# Patient Record
Sex: Female | Born: 1985 | Race: Black or African American | Hispanic: No | Marital: Single | Smoking: Former smoker
Health system: Southern US, Community
[De-identification: ages and names within clinical notes are randomized; demographics above are authoritative.]

## PROBLEM LIST (undated history)

## (undated) ENCOUNTER — Inpatient Hospital Stay (HOSPITAL_COMMUNITY): Payer: Self-pay

## (undated) DIAGNOSIS — Z8632 Personal history of gestational diabetes: Secondary | ICD-10-CM

## (undated) DIAGNOSIS — A749 Chlamydial infection, unspecified: Secondary | ICD-10-CM

## (undated) DIAGNOSIS — D649 Anemia, unspecified: Secondary | ICD-10-CM

## (undated) DIAGNOSIS — R87619 Unspecified abnormal cytological findings in specimens from cervix uteri: Secondary | ICD-10-CM

## (undated) DIAGNOSIS — N939 Abnormal uterine and vaginal bleeding, unspecified: Secondary | ICD-10-CM

## (undated) DIAGNOSIS — D259 Leiomyoma of uterus, unspecified: Secondary | ICD-10-CM

## (undated) DIAGNOSIS — I340 Nonrheumatic mitral (valve) insufficiency: Secondary | ICD-10-CM

## (undated) DIAGNOSIS — A549 Gonococcal infection, unspecified: Secondary | ICD-10-CM

## (undated) DIAGNOSIS — R87629 Unspecified abnormal cytological findings in specimens from vagina: Secondary | ICD-10-CM

## (undated) DIAGNOSIS — IMO0002 Reserved for concepts with insufficient information to code with codable children: Secondary | ICD-10-CM

## (undated) DIAGNOSIS — D500 Iron deficiency anemia secondary to blood loss (chronic): Secondary | ICD-10-CM

## (undated) DIAGNOSIS — Z8619 Personal history of other infectious and parasitic diseases: Secondary | ICD-10-CM

## (undated) DIAGNOSIS — Z8759 Personal history of other complications of pregnancy, childbirth and the puerperium: Secondary | ICD-10-CM

## (undated) DIAGNOSIS — Z8742 Personal history of other diseases of the female genital tract: Secondary | ICD-10-CM

## (undated) DIAGNOSIS — I272 Pulmonary hypertension, unspecified: Secondary | ICD-10-CM

## (undated) HISTORY — DX: Unspecified abnormal cytological findings in specimens from vagina: R87.629

---

## 2009-07-29 ENCOUNTER — Inpatient Hospital Stay (HOSPITAL_COMMUNITY): Admission: AD | Admit: 2009-07-29 | Discharge: 2009-07-29 | Payer: Self-pay | Admitting: Obstetrics & Gynecology

## 2009-10-30 ENCOUNTER — Emergency Department (HOSPITAL_BASED_OUTPATIENT_CLINIC_OR_DEPARTMENT_OTHER): Admission: EM | Admit: 2009-10-30 | Discharge: 2009-10-30 | Payer: Self-pay | Admitting: Emergency Medicine

## 2010-03-24 LAB — URINALYSIS, ROUTINE W REFLEX MICROSCOPIC
Glucose, UA: NEGATIVE mg/dL
Nitrite: NEGATIVE
Protein, ur: NEGATIVE mg/dL
Urobilinogen, UA: 0.2 mg/dL (ref 0.0–1.0)

## 2010-03-24 LAB — POCT PREGNANCY, URINE: Preg Test, Ur: NEGATIVE

## 2010-03-24 LAB — WET PREP, GENITAL: Yeast Wet Prep HPF POC: NONE SEEN

## 2010-07-08 DIAGNOSIS — Z8619 Personal history of other infectious and parasitic diseases: Secondary | ICD-10-CM

## 2010-07-08 DIAGNOSIS — A749 Chlamydial infection, unspecified: Secondary | ICD-10-CM

## 2010-07-08 HISTORY — DX: Personal history of other infectious and parasitic diseases: Z86.19

## 2010-07-08 HISTORY — DX: Chlamydial infection, unspecified: A74.9

## 2010-12-25 ENCOUNTER — Inpatient Hospital Stay (HOSPITAL_COMMUNITY)
Admission: AD | Admit: 2010-12-25 | Discharge: 2010-12-25 | Disposition: A | Discharge status: Home / Self Care | Payer: Self-pay | Source: Ambulatory Visit | Attending: Obstetrics & Gynecology | Admitting: Obstetrics & Gynecology

## 2010-12-25 ENCOUNTER — Encounter (HOSPITAL_COMMUNITY): Payer: Self-pay

## 2010-12-25 ENCOUNTER — Inpatient Hospital Stay (HOSPITAL_COMMUNITY): Payer: Self-pay

## 2010-12-25 DIAGNOSIS — R109 Unspecified abdominal pain: Secondary | ICD-10-CM | POA: Insufficient documentation

## 2010-12-25 DIAGNOSIS — N760 Acute vaginitis: Secondary | ICD-10-CM | POA: Insufficient documentation

## 2010-12-25 DIAGNOSIS — N831 Corpus luteum cyst of ovary, unspecified side: Secondary | ICD-10-CM | POA: Insufficient documentation

## 2010-12-25 DIAGNOSIS — B9689 Other specified bacterial agents as the cause of diseases classified elsewhere: Secondary | ICD-10-CM | POA: Insufficient documentation

## 2010-12-25 DIAGNOSIS — A499 Bacterial infection, unspecified: Secondary | ICD-10-CM

## 2010-12-25 HISTORY — DX: Gonococcal infection, unspecified: A54.9

## 2010-12-25 HISTORY — DX: Reserved for concepts with insufficient information to code with codable children: IMO0002

## 2010-12-25 HISTORY — DX: Chlamydial infection, unspecified: A74.9

## 2010-12-25 HISTORY — DX: Unspecified abnormal cytological findings in specimens from cervix uteri: R87.619

## 2010-12-25 LAB — URINALYSIS, ROUTINE W REFLEX MICROSCOPIC
Glucose, UA: NEGATIVE mg/dL
Leukocytes, UA: NEGATIVE
Specific Gravity, Urine: 1.02 (ref 1.005–1.030)
pH: 6.5 (ref 5.0–8.0)

## 2010-12-25 LAB — POCT PREGNANCY, URINE: Preg Test, Ur: NEGATIVE

## 2010-12-25 LAB — WET PREP, GENITAL: Yeast Wet Prep HPF POC: NONE SEEN

## 2010-12-25 MED ORDER — METRONIDAZOLE 500 MG PO TABS: 500.0000 mg | ORAL_TABLET | Freq: Two times a day (BID) | ORAL | Status: AC

## 2010-12-25 MED ORDER — IBUPROFEN 200 MG PO TABS: 800.0000 mg | ORAL_TABLET | Freq: Four times a day (QID) | ORAL | Status: DC | PRN

## 2010-12-25 NOTE — Progress Notes (Signed)
Pt states she has had cramping x 3 months-everyday

## 2010-12-25 NOTE — Progress Notes (Signed)
Patient states lower abdominal cramping x3 months. OTC pain medicine doesn't help with pain. States her periods have been 3 days long for the past 4 months and they are normally 6 days long. Also reports RLQ pain when straining to have bowel movement. States was treated for gonorrhea & chlamydia in July 2012 & hasn't been with that partner since being treated, but did not get follow up testing done. Is currently with 2 sex partners and uses condoms.

## 2010-12-25 NOTE — ED Provider Notes (Signed)
History     Chief Complaint  Patient presents with  . Abdominal Pain   HPI 25 y.o. G0P0000 with cramping x 3 months, daily, constant, no better with tylenol or advil. Currently has two sex partners, uses condoms, has been treated for GC/CT earlier this year.    Past Medical History  Diagnosis Date  . Abnormal Pap smear     abn paps since 2000. no additional testing done.   . Gonorrhea     treated in July 2012. hasn't been with that partner since.   . Chlamydia 07/2010    treated. hasn't been with that partner since.     Past Surgical History  Procedure Date  . No past surgeries     Family History  Problem Relation Age of Onset  . Hypertension Mother   . Miscarriages / Stillbirths Sister   . Hypertension Sister   . Stroke Maternal Grandmother     History  Substance Use Topics  . Smoking status: Former Smoker    Quit date: 11/25/2010  . Smokeless tobacco: Not on file  . Alcohol Use: No    Allergies: Allergies no known allergies  Prescriptions prior to admission  Medication Sig Dispense Refill  . ibuprofen (ADVIL,MOTRIN) 200 MG tablet Take 400 mg by mouth every 6 (six) hours as needed. For pain         Review of Systems  Constitutional: Negative.   Respiratory: Negative.   Cardiovascular: Negative.   Gastrointestinal: Positive for abdominal pain. Negative for nausea, vomiting, diarrhea and constipation.       + pain with BM  Genitourinary: Negative for dysuria, urgency, frequency, hematuria and flank pain.       Negative for vaginal bleeding, dyspareunia, discharge   Musculoskeletal: Negative.   Neurological: Negative.   Psychiatric/Behavioral: Negative.    Physical Exam   Blood pressure 110/63, pulse 88, temperature 98.2 F (36.8 C), temperature source Oral, resp. rate 20, height 5\' 4"  (1.626 m), weight 200 lb (90.719 kg), last menstrual period 12/07/2010, SpO2 98.00%.  Physical Exam  Constitutional: She is oriented to person, place, and time. She  appears well-developed and well-nourished. No distress.  HENT:  Head: Normocephalic and atraumatic.  Cardiovascular: Normal rate, regular rhythm and normal heart sounds.   Respiratory: Effort normal and breath sounds normal. No respiratory distress.  GI: Soft. Bowel sounds are normal. She exhibits no distension and no mass. There is no tenderness. There is no rebound and no guarding.  Genitourinary: There is no rash or lesion on the right labia. There is no rash or lesion on the left labia. Uterus is not deviated, not enlarged, not fixed and not tender. Cervix exhibits no motion tenderness, no discharge and no friability. Right adnexum displays tenderness. Right adnexum displays no mass and no fullness. Left adnexum displays no mass, no tenderness and no fullness. No erythema, tenderness or bleeding around the vagina. Vaginal discharge (white) found.  Neurological: She is alert and oriented to person, place, and time.  Skin: Skin is warm and dry.  Psychiatric: She has a normal mood and affect.    MAU Course  Procedures  Results for orders placed during the hospital encounter of 12/25/10 (from the past 24 hour(s))  URINALYSIS, ROUTINE W REFLEX MICROSCOPIC     Status: Normal   Collection Time   12/25/10  9:15 PM      Component Value Range   Color, Urine YELLOW  YELLOW    APPearance CLEAR  CLEAR    Specific Gravity,  Urine 1.020  1.005 - 1.030    pH 6.5  5.0 - 8.0    Glucose, UA NEGATIVE  NEGATIVE (mg/dL)   Hgb urine dipstick NEGATIVE  NEGATIVE    Bilirubin Urine NEGATIVE  NEGATIVE    Ketones, ur NEGATIVE  NEGATIVE (mg/dL)   Protein, ur NEGATIVE  NEGATIVE (mg/dL)   Urobilinogen, UA 0.2  0.0 - 1.0 (mg/dL)   Nitrite NEGATIVE  NEGATIVE    Leukocytes, UA NEGATIVE  NEGATIVE   POCT PREGNANCY, URINE     Status: Normal   Collection Time   12/25/10  9:24 PM      Component Value Range   Preg Test, Ur NEGATIVE    WET PREP, GENITAL     Status: Abnormal   Collection Time   12/25/10  9:46 PM       Component Value Range   Yeast, Wet Prep NONE SEEN  NONE SEEN    Trich, Wet Prep NONE SEEN  NONE SEEN    Clue Cells, Wet Prep FEW (*) NONE SEEN    WBC, Wet Prep HPF POC NONE SEEN  NONE SEEN     US Transvaginal Non-ob  12/25/2010  *RADIOLOGY REPORT*  Clinical Data: Right pelvic pain x 3 months  TRANSABDOMINAL AND TRANSVAGINAL ULTRASOUND OF PELVIS Technique:  Both transabdominal and transvaginal ultrasound examinations of the pelvis were performed. Transabdominal technique was performed for global imaging of the pelvis including uterus, ovaries, adnexal regions, and pelvic cul-de-sac.  Comparison: None.   It was necessary to proceed with endovaginal exam following the transabdominal exam to visualize the bilateral ovaries.  Findings:  Uterus: Normal in size and appearance, measuring 8.0 x 4.1 x 4.5 cm.  Endometrium: Normal in thickness and appearance, measuring 11 mm.  Right ovary:  Normal appearance/no adnexal mass, measuring 3.0 x 1.8 x 2.0 cm, notable for a 1.1 cm involuting corpus luteal cyst.  Left ovary: Normal appearance/no adnexal mass, measuring 3.0 x 1.9 x 2.1 cm.  Other findings: No free fluid.  IMPRESSION: Normal study.  No evidence of pelvic mass or other significant abnormality.  1.1 cm involuting right corpus luteal cyst.  Original Report Authenticated By: Charline Bills, M.D.   US Pelvis Complete  12/25/2010  *RADIOLOGY REPORT*  Clinical Data: Right pelvic pain x 3 months  TRANSABDOMINAL AND TRANSVAGINAL ULTRASOUND OF PELVIS Technique:  Both transabdominal and transvaginal ultrasound examinations of the pelvis were performed. Transabdominal technique was performed for global imaging of the pelvis including uterus, ovaries, adnexal regions, and pelvic cul-de-sac.  Comparison: None.   It was necessary to proceed with endovaginal exam following the transabdominal exam to visualize the bilateral ovaries.  Findings:  Uterus: Normal in size and appearance, measuring 8.0 x 4.1 x 4.5 cm.   Endometrium: Normal in thickness and appearance, measuring 11 mm.  Right ovary:  Normal appearance/no adnexal mass, measuring 3.0 x 1.8 x 2.0 cm, notable for a 1.1 cm involuting corpus luteal cyst.  Left ovary: Normal appearance/no adnexal mass, measuring 3.0 x 1.9 x 2.1 cm.  Other findings: No free fluid.  IMPRESSION: Normal study.  No evidence of pelvic mass or other significant abnormality.  1.1 cm involuting right corpus luteal cyst.  Original Report Authenticated By: Charline Bills, M.D.    Assessment and Plan  25 y.o. female with BV and corpus luteum cyst Rx Flagyl 500 BID x 7 days F/U at [facility] for further STD testing if desired  FRAZIER,NATALIE 12/25/2010, 9:39 PM

## 2010-12-26 LAB — GC/CHLAMYDIA PROBE AMP, GENITAL
Chlamydia, DNA Probe: NEGATIVE
GC Probe Amp, Genital: NEGATIVE

## 2011-08-26 ENCOUNTER — Emergency Department (HOSPITAL_COMMUNITY): Payer: Self-pay

## 2011-08-26 ENCOUNTER — Encounter (HOSPITAL_COMMUNITY): Payer: Self-pay | Admitting: *Deleted

## 2011-08-26 ENCOUNTER — Emergency Department (HOSPITAL_COMMUNITY)
Admission: EM | Admit: 2011-08-26 | Discharge: 2011-08-26 | Disposition: A | Discharge status: Home / Self Care | Payer: Self-pay | Attending: Emergency Medicine | Admitting: Emergency Medicine

## 2011-08-26 DIAGNOSIS — Z87891 Personal history of nicotine dependence: Secondary | ICD-10-CM | POA: Insufficient documentation

## 2011-08-26 DIAGNOSIS — Z823 Family history of stroke: Secondary | ICD-10-CM | POA: Insufficient documentation

## 2011-08-26 DIAGNOSIS — Z8249 Family history of ischemic heart disease and other diseases of the circulatory system: Secondary | ICD-10-CM | POA: Insufficient documentation

## 2011-08-26 DIAGNOSIS — IMO0002 Reserved for concepts with insufficient information to code with codable children: Secondary | ICD-10-CM | POA: Insufficient documentation

## 2011-08-26 DIAGNOSIS — S9030XA Contusion of unspecified foot, initial encounter: Secondary | ICD-10-CM

## 2011-08-26 DIAGNOSIS — Z8489 Family history of other specified conditions: Secondary | ICD-10-CM | POA: Insufficient documentation

## 2011-08-26 MED ORDER — IBUPROFEN 800 MG PO TABS: 800.0000 mg | ORAL_TABLET | Freq: Three times a day (TID) | ORAL | Status: AC

## 2011-08-26 NOTE — ED Provider Notes (Signed)
History  This chart was scribed for Darlene Octave, MD by Darlene Hayes. The patient was seen in room TR04C/TR04C. Patient's care was started at 1323.     CSN: [id number]  Arrival date & time 08/26/11  1323   None     Chief Complaint  Patient presents with  . Foot Pain    The history is provided by the patient. No language interpreter was used.   Darlene Hayes is a 26 y.o. female who presents to the Emergency Department complaining of moderate, constant right foot pain onset. Patient states that she stubbed her toe on her father's prosthetic leg 1 week ago. She says that at the time of the injury there was some immediate soreness and bleeding, but pain has worsened in the past 1-2 days. Patient hasn't elevated her foot or taken any medications for pain relief. Patient has history gonorrhea, chlamydia and abnormal past smears. Patient is a former smoker (quit date: 11/25/2010).    Past Medical History  Diagnosis Date  . Abnormal Pap smear     abn paps since 2000. no additional testing done.   . Gonorrhea     treated in July 2012. hasn't been with that partner since.   . Chlamydia 07/2010    treated. hasn't been with that partner since.     Past Surgical History  Procedure Date  . No past surgeries     Family History  Problem Relation Age of Onset  . Hypertension Mother   . Miscarriages / Stillbirths Sister   . Hypertension Sister   . Stroke Maternal Grandmother     History  Substance Use Topics  . Smoking status: Former Smoker    Quit date: 11/25/2010  . Smokeless tobacco: Not on file  . Alcohol Use: No    OB History    Grav Para Term Preterm Abortions TAB SAB Ect Mult Living   0 0 0 0 0 0 0 0 0 0       Review of Systems A complete 10 system review of systems was obtained and all systems are negative except as noted in the HPI and PMH. .  Allergies  Review of patient's allergies indicates no known allergies.  Home Medications  No current outpatient  prescriptions on file.  BP 116/75  Pulse 100  Temp 98.2 F (36.8 C) (Oral)  Resp 16  SpO2 100%  LMP 08/19/2011  Physical Exam  Constitutional: She is oriented to person, place, and time. She appears well-developed and well-nourished.  HENT:  Head: Normocephalic and atraumatic.  Cardiovascular:  Pulses:      Dorsalis pedis pulses are 2+ on the right side, and 2+ on the left side.       Posterior tibial pulses are 2+ on the right side, and 2+ on the left side.  Musculoskeletal:       Diffuse swelling of the left second digit. No pain at base of fifth metatarsal. No pain over the malleoli. No open wounds.  Neurological: She is alert and oriented to person, place, and time.  Skin: Skin is warm and dry.  Psychiatric: She has a normal mood and affect. Her behavior is normal.    ED Course  Procedures (including critical care time) DIAGNOSTIC STUDIES: Oxygen Saturation is 100% on room air, normal by my interpretation.    COORDINATION OF CARE: 2:50pm- Patient informed of current plan for treatment and evaluation and agrees with plan at this time.   Dg Foot Complete Left  08/26/2011  *RADIOLOGY  REPORT*  Clinical Data: Pain and swelling, injury 1 week ago  LEFT FOOT - COMPLETE 3+ VIEW  Comparison: None  Findings: Osseous mineralization normal. Joint spaces preserved. No acute fracture, dislocation, or bone destruction.  IMPRESSION: No acute osseous abnormalities.   Original Report Authenticated By: Lollie Marrow, M.D.      No diagnosis found.    MDM  Stubbed 1st and 2nd toes 1 week ago.  Now with swelling and pain to second digit and forefoot.  No weakness, numbness, tingling.  No open wounds. +2 DP and PT pulses. No pain at base of 5th MT.   I personally performed the services described in this documentation, which was scribed in my presence.  The recorded information has been reviewed and considered.   Darlene Octave, MD 08/27/11 [phone]

## 2011-08-26 NOTE — ED Notes (Signed)
PT states walked into family member's prothesis and injured left foot

## 2012-04-29 ENCOUNTER — Ambulatory Visit: Payer: Self-pay | Admitting: Women's Health

## 2012-05-14 ENCOUNTER — Ambulatory Visit: Payer: Self-pay | Admitting: Women's Health

## 2012-10-13 ENCOUNTER — Encounter: Payer: Self-pay | Admitting: Women's Health

## 2012-10-13 ENCOUNTER — Other Ambulatory Visit (HOSPITAL_COMMUNITY)
Admission: RE | Admit: 2012-10-13 | Discharge: 2012-10-13 | Disposition: A | Discharge status: Home / Self Care | Payer: Self-pay | Source: Ambulatory Visit | Attending: Gynecology | Admitting: Gynecology

## 2012-10-13 ENCOUNTER — Ambulatory Visit (INDEPENDENT_AMBULATORY_CARE_PROVIDER_SITE_OTHER): Payer: PRIVATE HEALTH INSURANCE | Admitting: Women's Health

## 2012-10-13 VITALS — BP 120/70 | Ht 64.75 in | Wt 189.6 lb

## 2012-10-13 DIAGNOSIS — F17200 Nicotine dependence, unspecified, uncomplicated: Secondary | ICD-10-CM | POA: Insufficient documentation

## 2012-10-13 DIAGNOSIS — E669 Obesity, unspecified: Secondary | ICD-10-CM | POA: Insufficient documentation

## 2012-10-13 DIAGNOSIS — Z01419 Encounter for gynecological examination (general) (routine) without abnormal findings: Secondary | ICD-10-CM | POA: Insufficient documentation

## 2012-10-13 DIAGNOSIS — Z23 Encounter for immunization: Secondary | ICD-10-CM

## 2012-10-13 DIAGNOSIS — Z113 Encounter for screening for infections with a predominantly sexual mode of transmission: Secondary | ICD-10-CM

## 2012-10-13 DIAGNOSIS — Z833 Family history of diabetes mellitus: Secondary | ICD-10-CM

## 2012-10-13 LAB — CBC WITH DIFFERENTIAL/PLATELET
Basophils Absolute: 0 10*3/uL (ref 0.0–0.1)
Eosinophils Absolute: 0.1 10*3/uL (ref 0.0–0.7)
Lymphocytes Relative: 40 % (ref 12–46)
Lymphs Abs: 1.6 10*3/uL (ref 0.7–4.0)
Neutrophils Relative %: 44 % (ref 43–77)
Platelets: 206 10*3/uL (ref 150–400)
RBC: 3.9 MIL/uL (ref 3.87–5.11)
RDW: 18 % — ABNORMAL HIGH (ref 11.5–15.5)
WBC: 4 10*3/uL (ref 4.0–10.5)

## 2012-10-13 LAB — RPR

## 2012-10-13 NOTE — Patient Instructions (Addendum)

## 2012-10-13 NOTE — Progress Notes (Signed)
Darlene Hayes 1985/02/09 [id number]    History:     New patient presents for annual exam.  Regular monthly cycle/condoms/one partner for one year.  Treated for Chlamydia/ gonorrhea 2011 with negative test of cure. History of abnormal Pap but no treatment needed with Pap normal. Has not had gardasil. Smoker.  Past medical history, past surgical history, family history and social history were all reviewed and documented in the EPIC chart. 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  Mother HTN Sister HTN MGM Diabetes, stroke  ROS:  A  ROS was performed and pertinent positives and negatives are included in the history.  Exam:  Filed Vitals:   10/13/12 0905  BP: 120/70    General appearance:  Normal Head/Neck:  Normal, without cervical or supraclavicular adenopathy. Thyroid:  Symmetrical, normal in size, without palpable masses or nodularity. Respiratory  Effort:  Normal  Auscultation:  Clear without wheezing or rhonchi Cardiovascular  Auscultation:  Regular rate, without rubs, murmurs or gallops  Edema/varicosities:  Not grossly evident Abdominal  Soft,nontender, without masses, guarding or rebound.  Liver/spleen:  No organomegaly noted  Hernia:  None appreciated  Skin  Inspection:  Grossly normal  Palpation:  Grossly normal Neurologic/psychiatric  Orientation:  Normal with appropriate conversation.  Mood/affect:  Normal  Genitourinary    Breasts: Examined lying and sitting.     Right: Without masses, retractions, discharge or axillary adenopathy.     Left: Without masses, retractions, discharge or axillary adenopathy.   Inguinal/mons:  Normal without inguinal adenopathy  External genitalia:  Normal  BUS/Urethra/Skene's glands:  Normal  Bladder:  Normal  Vagina:  Normal  Cervix:  Normal without CMT or tenderness  Uterus:  Normal in size, shape and contour.  Midline and mobile  Adnexa/parametria:     Rt: Without masses or  tenderness.   Lt: Without masses or tenderness.  Anus and perineum: Normal  Digital rectal exam: Normal sphincter tone without palpated masses or tenderness  Assessment/Plan:  27 y.o. SBF for annual exam.     Normal GYN exam Contraception management STD Screen  Obesity Smoker  Plan: Tdap administered.  Reviewed heart healthy low-sodium diet with exercise/ weight loss.  Contraception options reviewed and declined will continue with condom use, Plan B emergency contraception reviewed. SBE's, exercise, calcium rich diet, MVI daily, decrease and/or quit smoking  encouraged.  UA, CBC, glucose, GC/ chalmydia, RPR, HIV, Hep Darlene Hayes, 9:36 AM 10/13/2012

## 2012-10-14 LAB — URINALYSIS W MICROSCOPIC + REFLEX CULTURE
Casts: NONE SEEN
Hgb urine dipstick: NEGATIVE
Ketones, ur: NEGATIVE mg/dL
Nitrite: POSITIVE — AB
Specific Gravity, Urine: 1.018 (ref 1.005–1.030)
Urobilinogen, UA: 0.2 mg/dL (ref 0.0–1.0)
pH: 6 (ref 5.0–8.0)

## 2012-10-14 LAB — HEPATITIS B SURFACE ANTIGEN: Hepatitis B Surface Ag: NEGATIVE

## 2012-10-14 LAB — HIV ANTIBODY (ROUTINE TESTING W REFLEX): HIV: NONREACTIVE

## 2012-10-15 ENCOUNTER — Other Ambulatory Visit: Payer: Self-pay | Admitting: Women's Health

## 2012-10-15 LAB — URINE CULTURE: Colony Count: >=100000

## 2012-10-15 MED ORDER — SULFAMETHOXAZOLE-TMP DS 800-160 MG PO TABS: 1.0000 | ORAL_TABLET | Freq: Two times a day (BID) | ORAL | Status: DC

## 2013-02-02 ENCOUNTER — Ambulatory Visit: Payer: PRIVATE HEALTH INSURANCE | Admitting: Women's Health

## 2013-02-10 ENCOUNTER — Ambulatory Visit: Payer: PRIVATE HEALTH INSURANCE | Admitting: Women's Health

## 2013-07-14 ENCOUNTER — Encounter: Payer: PRIVATE HEALTH INSURANCE | Admitting: Medical

## 2013-08-25 ENCOUNTER — Ambulatory Visit: Payer: PRIVATE HEALTH INSURANCE | Admitting: Women's Health

## 2013-10-20 ENCOUNTER — Encounter: Payer: PRIVATE HEALTH INSURANCE | Admitting: Women's Health

## 2015-01-08 NOTE — L&D Delivery Note (Signed)
Delivery Note At 5:44 PM a viable female was delivered via Vaginal, Spontaneous Delivery (Presentation:vertex ; ROA ).  APGAR: 8, 9; weight  .   Placenta status:spontaneous , shultz.  Cord: 3VC with the following complications:none .  Cord pH: n/a  Anesthesia:  epidural Episiotomy: None Lacerations:  n/a Suture Repair: n/a Est. Blood Loss (mL):  100  Mom to postpartum.  Baby to Couplet care / Skin to Skin.  Darlene Hayes 09/03/2015, 6:07 PM

## 2015-01-18 ENCOUNTER — Inpatient Hospital Stay (HOSPITAL_COMMUNITY)
Admission: AD | Admit: 2015-01-18 | Discharge: 2015-01-18 | Disposition: A | Discharge status: Home / Self Care | Payer: BLUE CROSS/BLUE SHIELD | Source: Ambulatory Visit | Attending: Family Medicine | Admitting: Family Medicine

## 2015-01-18 ENCOUNTER — Encounter (HOSPITAL_COMMUNITY): Payer: Self-pay | Admitting: *Deleted

## 2015-01-18 DIAGNOSIS — B354 Tinea corporis: Secondary | ICD-10-CM | POA: Insufficient documentation

## 2015-01-18 DIAGNOSIS — Z3A08 8 weeks gestation of pregnancy: Secondary | ICD-10-CM | POA: Insufficient documentation

## 2015-01-18 DIAGNOSIS — N912 Amenorrhea, unspecified: Secondary | ICD-10-CM | POA: Diagnosis not present

## 2015-01-18 DIAGNOSIS — R109 Unspecified abdominal pain: Secondary | ICD-10-CM | POA: Insufficient documentation

## 2015-01-18 DIAGNOSIS — F17210 Nicotine dependence, cigarettes, uncomplicated: Secondary | ICD-10-CM | POA: Insufficient documentation

## 2015-01-18 DIAGNOSIS — O208 Other hemorrhage in early pregnancy: Secondary | ICD-10-CM | POA: Diagnosis not present

## 2015-01-18 DIAGNOSIS — R21 Rash and other nonspecific skin eruption: Secondary | ICD-10-CM | POA: Insufficient documentation

## 2015-01-18 DIAGNOSIS — Z3201 Encounter for pregnancy test, result positive: Secondary | ICD-10-CM | POA: Insufficient documentation

## 2015-01-18 DIAGNOSIS — N926 Irregular menstruation, unspecified: Secondary | ICD-10-CM

## 2015-01-18 LAB — POCT PREGNANCY, URINE: PREG TEST UR: POSITIVE — AB

## 2015-01-18 MED ORDER — PRENATAL VITAMINS PLUS 27-1 MG PO TABS: 1.0000 | ORAL_TABLET | Freq: Every day | ORAL | Status: DC

## 2015-01-18 NOTE — MAU Note (Signed)
Urine sent to lab 

## 2015-01-18 NOTE — MAU Note (Addendum)
Period was to come on 12/12. Had some spotting. Spotted a couple days ago. +HPT  After missed period. Has had some cramping, but only during the night

## 2015-01-18 NOTE — Discharge Instructions (Signed)
Lotrimin AF - buy over the counter.  Apply twice daily to rash  Prenatal Care WHAT IS PRENATAL CARE?  Prenatal care is the process of caring for a pregnant woman before she gives birth. Prenatal care makes sure that she and her baby remain as healthy as possible throughout pregnancy. Prenatal care may be provided by a midwife, family practice health care provider, or a childbirth and pregnancy specialist (obstetrician). Prenatal care may include physical examinations, testing, treatments, and education on nutrition, lifestyle, and social support services. WHY IS PRENATAL CARE SO IMPORTANT?  Early and consistent prenatal care increases the chance that you and your baby will remain healthy throughout your pregnancy. This type of care also decreases a baby's risk of being born too early (prematurely), or being born smaller than expected (small for gestational age). Any underlying medical conditions you may have that could pose a risk during your pregnancy are discussed during prenatal care visits. You will also be monitored regularly for any new conditions that may arise during your pregnancy so they can be treated quickly and effectively. WHAT HAPPENS DURING PRENATAL CARE VISITS? Prenatal care visits may include the following: Discussion Tell your health care provider about any new signs or symptoms you have experienced since your last visit. These might include:  Nausea or vomiting.  Increased or decreased level of energy.  Difficulty sleeping.  Back or leg pain.  Weight changes.  Frequent urination.  Shortness of breath with physical activity.  Changes in your skin, such as the development of a rash or itchiness.  Vaginal discharge or bleeding.  Feelings of excitement or nervousness.  Changes in your baby's movements. You may want to write down any questions or topics you want to discuss with your health care provider and bring them with you to your  appointment. Examination During your first prenatal care visit, you will likely have a complete physical exam. Your health care provider will often examine your vagina, cervix, and the position of your uterus, as well as check your heart, lungs, and other body systems. As your pregnancy progresses, your health care provider will measure the size of your uterus and your baby's position inside your uterus. He or she may also examine you for early signs of labor. Your prenatal visits may also include checking your blood pressure and, after about 10-12 weeks of pregnancy, listening to your baby's heartbeat. Testing Regular testing often includes:  Urinalysis. This checks your urine for glucose, protein, or signs of infection.  Blood count. This checks the levels of white and red blood cells in your body.  Tests for sexually transmitted infections (STIs). Testing for STIs at the beginning of pregnancy is routinely done and is required in many states.  Antibody testing. You will be checked to see if you are immune to certain illnesses, such as rubella, that can affect a developing fetus.  Glucose screen. Around 24-28 weeks of pregnancy, your blood glucose level will be checked for signs of gestational diabetes. Follow-up tests may be recommended.  Group B strep. This is a bacteria that is commonly found inside a woman's vagina. This test will inform your health care provider if you need an antibiotic to reduce the amount of this bacteria in your body prior to labor and childbirth.  Ultrasound. Many pregnant women undergo an ultrasound screening around 18-20 weeks of pregnancy to evaluate the health of the fetus and check for any developmental abnormalities.  HIV (human immunodeficiency virus) testing. Early in your pregnancy, you will  be screened for HIV. If you are at high risk for HIV, this test may be repeated during your third trimester of pregnancy. You may be offered other testing based on your  age, personal or family medical history, or other factors.  HOW OFTEN SHOULD I PLAN TO SEE MY HEALTH CARE PROVIDER FOR PRENATAL CARE? Your prenatal care check-up schedule depends on any medical conditions you have before, or develop during, your pregnancy. If you do not have any underlying medical conditions, you will likely be seen for checkups:  Monthly, during the first 6 months of pregnancy.  Twice a month during months 7 and 8 of pregnancy.  Weekly starting in the 9th month of pregnancy and until delivery. If you develop signs of early labor or other concerning signs or symptoms, you may need to see your health care provider more often. Ask your health care provider what prenatal care schedule is best for you. WHAT CAN I DO TO KEEP MYSELF AND MY BABY AS HEALTHY AS POSSIBLE DURING MY PREGNANCY?  Take a prenatal vitamin containing 400 micrograms (0.4 mg) of folic acid every day. Your health care provider may also ask you to take additional vitamins such as iodine, vitamin D, iron, copper, and zinc.  Take 1500-2000 mg of calcium daily starting at your 20th week of pregnancy until you deliver your baby.  Make sure you are up to date on your vaccinations. Unless directed otherwise by your health care provider:  You should receive a tetanus, diphtheria, and pertussis (Tdap) vaccination between the 27th and 36th week of your pregnancy, regardless of when your last Tdap immunization occurred. This helps protect your baby from whooping cough (pertussis) after he or she is born.  You should receive an annual inactivated influenza vaccine (IIV) to help protect you and your baby from influenza. This can be done at any point during your pregnancy.  Eat a well-rounded diet that includes:  Fresh fruits and vegetables.  Lean proteins.  Calcium-rich foods such as milk, yogurt, hard cheeses, and dark, leafy greens.  Whole grain breads.  Do noteat seafood high in mercury,  including:  Swordfish.  Tilefish.  Shark.  King mackerel.  More than 6 oz tuna per week.  Do not eat:  Raw or undercooked meats or eggs.  Unpasteurized foods, such as soft cheeses (brie, blue, or feta), juices, and milks.  Lunch meats.  Hot dogs that have not been heated until they are steaming.  Drink enough water to keep your urine clear or pale yellow. For many women, this may be 10 or more 8 oz glasses of water each day. Keeping yourself hydrated helps deliver nutrients to your baby and may prevent the start of pre-term uterine contractions.  Do not use any tobacco products including cigarettes, chewing tobacco, or electronic cigarettes. If you need help quitting, ask your health care provider.  Do not drink beverages containing alcohol. No safe level of alcohol consumption during pregnancy has been determined.  Do not use any illegal drugs. These can harm your developing baby or cause a miscarriage.  Ask your health care provider or pharmacist before taking any prescription or over-the-counter medicines, herbs, or supplements.  Limit your caffeine intake to no more than 200 mg per day.  Exercise. Unless told otherwise by your health care provider, try to get 30 minutes of moderate exercise most days of the week. Do not  do high-impact activities, contact sports, or activities with a high risk of falling, such as horseback riding  or downhill skiing.  Get plenty of rest.  Avoid anything that raises your body temperature, such as hot tubs and saunas.  If you own a cat, do not empty its litter box. Bacteria contained in cat feces can cause an infection called toxoplasmosis. This can result in serious harm to the fetus.  Stay away from chemicals such as insecticides, lead, mercury, and cleaning or paint products that contain solvents.  Do not have any X-rays taken unless medically necessary.  Take a childbirth and breastfeeding preparation class. Ask your health care  provider if you need a referral or recommendation.   This information is not intended to replace advice given to you by your health care provider. Make sure you discuss any questions you have with your health care provider.   Document Released: 12/27/2002 Document Revised: 01/14/2014 Document Reviewed: 03/10/2013 Elsevier Interactive Patient Education [employer]2016 [employer]

## 2015-01-18 NOTE — MAU Provider Note (Signed)
History     CSN: [id number]  Arrival date and time: 01/18/15 1634   First Provider Initiated Contact with Patient 01/18/15 1910      Chief Complaint  Patient presents with  . Possible Pregnancy  . Vaginal Bleeding   HPI Darlene Hayes 30 y.o. G1P0000 @[redacted]w[redacted]d  presents to MAU with complaint of prior bleeding and cramping.  She was spotting around 12/19/14.  It was noticed with wiping only after having a bowel movement and only once.  At the same time, she had some mild abdominal cramping.  That was located LLQ and went away within a few minutes of the bowel movement.   She also has a rash and notices itching at the inguinal fold only after sweating.  It has been ongoing for a few weeks.  She has tried nothing to treat it.  Not noted anywhere else.   No vaginal bleeding or abdominal cramping in more than 2 weeks.  Last IC was 12/03/14.   OB History    Gravida Para Term Preterm AB TAB SAB Ectopic Multiple Living   1 0 0 0 0 0 0 0 0 0       Past Medical History  Diagnosis Date  . Abnormal Pap smear     abn paps since 2000. no additional testing done.   . Gonorrhea     treated in July 2012. hasn't been with that partner since.   . Chlamydia 07/2010    treated. hasn't been with that partner since.     Past Surgical History  Procedure Laterality Date  . No past surgeries      Family History  Problem Relation Age of Onset  . Hypertension Mother   . Miscarriages / Stillbirths Sister   . Hypertension Sister   . Stroke Maternal Grandmother   . Diabetes Maternal Grandmother     Social History  Substance Use Topics  . Smoking status: Current Every Day Smoker -- 0.15 packs/day    Types: Cigarettes  . Smokeless tobacco: Never Used  . Alcohol Use: No    Allergies: No Known Allergies  Prescriptions prior to admission  Medication Sig Dispense Refill Last Dose  . sulfamethoxazole-trimethoprim (BACTRIM DS) 800-160 MG per tablet Take 1 tablet by mouth 2 (two) times daily. 6  tablet 0     ROS Pertinent ROS in HPI.  All other systems are negative.   Physical Exam   Blood pressure 121/58, pulse 75, temperature 97.8 F (36.6 C), temperature source Oral, resp. rate 18, height 5\' 5"  (1.651 m), weight 197 lb 3.2 oz (89.449 kg), last menstrual period 11/19/2014.  Physical Exam  Constitutional: She is oriented to person, place, and time.  HENT:  Head: Normocephalic and atraumatic.  Eyes: Conjunctivae and EOM are normal.  Neck: Normal range of motion.  Respiratory: Effort normal. No respiratory distress.  GI: Soft. She exhibits no distension. There is no tenderness. There is no rebound and no guarding.  Genitourinary:  Inguinal fold bilaterally with hyperpigmentation and satellite lesions.  Area appears moist with perspiration.   Musculoskeletal: Normal range of motion. She exhibits no edema.  Neurological: She is alert and oriented to person, place, and time.  Skin: Skin is warm and dry.  Psychiatric: She has a normal mood and affect. Her behavior is normal.    MAU Course  Procedures  MDM No current symptoms.  Nearly one month since spotting and cramping noticed.  No abdominal pain on exam.   Positive pregnancy test Tinea corporis on exam  Assessment and Plan  A:  1. Positive pregnancy test   2. Missed period   3. Tinea corporis    P: [facility]charge [facility] providers given PNV - rx given Use OTC Lotrimin AF for rash/itching Pregnancy verification letter provided Patient may return to MAU as needed or if her condition were to change or worsen   Bertram DenverKaren E Teague Clark 01/18/2015, 7:11 PM

## 2015-02-27 ENCOUNTER — Ambulatory Visit (INDEPENDENT_AMBULATORY_CARE_PROVIDER_SITE_OTHER): Payer: BLUE CROSS/BLUE SHIELD | Admitting: Family Medicine

## 2015-02-27 ENCOUNTER — Encounter: Payer: Self-pay | Admitting: Family Medicine

## 2015-02-27 VITALS — BP 117/81 | HR 109 | Ht 63.5 in | Wt 196.0 lb

## 2015-02-27 DIAGNOSIS — Z113 Encounter for screening for infections with a predominantly sexual mode of transmission: Secondary | ICD-10-CM

## 2015-02-27 DIAGNOSIS — O98811 Other maternal infectious and parasitic diseases complicating pregnancy, first trimester: Secondary | ICD-10-CM

## 2015-02-27 DIAGNOSIS — Z124 Encounter for screening for malignant neoplasm of cervix: Secondary | ICD-10-CM

## 2015-02-27 DIAGNOSIS — Z3491 Encounter for supervision of normal pregnancy, unspecified, first trimester: Secondary | ICD-10-CM

## 2015-02-27 DIAGNOSIS — B372 Candidiasis of skin and nail: Secondary | ICD-10-CM

## 2015-02-27 DIAGNOSIS — O99211 Obesity complicating pregnancy, first trimester: Secondary | ICD-10-CM | POA: Diagnosis not present

## 2015-02-27 DIAGNOSIS — Z1151 Encounter for screening for human papillomavirus (HPV): Secondary | ICD-10-CM

## 2015-02-27 DIAGNOSIS — Z3490 Encounter for supervision of normal pregnancy, unspecified, unspecified trimester: Secondary | ICD-10-CM | POA: Insufficient documentation

## 2015-02-27 DIAGNOSIS — F17290 Nicotine dependence, other tobacco product, uncomplicated: Secondary | ICD-10-CM

## 2015-02-27 DIAGNOSIS — O99210 Obesity complicating pregnancy, unspecified trimester: Secondary | ICD-10-CM | POA: Insufficient documentation

## 2015-02-27 DIAGNOSIS — E669 Obesity, unspecified: Secondary | ICD-10-CM

## 2015-02-27 DIAGNOSIS — O0991 Supervision of high risk pregnancy, unspecified, first trimester: Secondary | ICD-10-CM

## 2015-02-27 DIAGNOSIS — B373 Candidiasis of vulva and vagina: Secondary | ICD-10-CM

## 2015-02-27 DIAGNOSIS — O99331 Smoking (tobacco) complicating pregnancy, first trimester: Secondary | ICD-10-CM

## 2015-02-27 DIAGNOSIS — O99330 Smoking (tobacco) complicating pregnancy, unspecified trimester: Secondary | ICD-10-CM

## 2015-02-27 LAB — POCT URINALYSIS DIP (DEVICE)
GLUCOSE, UA: NEGATIVE mg/dL
HGB URINE DIPSTICK: NEGATIVE
KETONES UR: NEGATIVE mg/dL
Leukocytes, UA: NEGATIVE
Nitrite: NEGATIVE
PH: 6.5 (ref 5.0–8.0)
PROTEIN: 30 mg/dL — AB
SPECIFIC GRAVITY, URINE: 1.025 (ref 1.005–1.030)
UROBILINOGEN UA: 0.2 mg/dL (ref 0.0–1.0)

## 2015-02-27 MED ORDER — NYSTATIN 100000 UNIT/GM EX POWD: CUTANEOUS | Status: DC

## 2015-02-27 NOTE — Progress Notes (Signed)
U/S scheduled for 03/27/2015 :00 PM

## 2015-02-27 NOTE — Progress Notes (Signed)
New OB packet given

## 2015-02-27 NOTE — Progress Notes (Signed)
Nutrition note: 1st visit consult Pt has h/o obesity. Pt has gained 4# @ [redacted]w[redacted]d, which is wnl. Pt reports eating 2 meals & 1-2 snacks/d. Pt is taking a PNV. Pt reports having nausea occ but no vomiting or heartburn. Pt received verbal & written education on general nutrition during pregnancy. Discussed tips to decrease nausea. Discussed importance & benefits of BF. Discussed wt gain goals of 11-20# or 0.5#/wk. Pt agrees to continue taking PNV. Pt does not have WIC but plans to apply. Pt plans to 'try' breastfeeding. F/u as needed Blondell Reveal, MS, RD, LDN, [facility]

## 2015-02-27 NOTE — Patient Instructions (Addendum)
Safe Medications in Pregnancy   Acne: Benzoyl Peroxide Salicylic Acid  Backache/Headache: Tylenol: 2 regular strength every 4 hours OR              2 Extra strength every 6 hours  Colds/Coughs/Allergies: Benadryl (alcohol free) 25 mg every 6 hours as needed Breath right strips Claritin Cepacol throat lozenges Chloraseptic throat spray Cold-Eeze- up to three times per day Cough drops, alcohol free Flonase (by prescription only) Guaifenesin Mucinex Robitussin DM (plain only, alcohol free) Saline nasal spray/drops Sudafed (pseudoephedrine) & Actifed ** use only after [redacted] weeks gestation and if you do not have high blood pressure Tylenol Vicks Vaporub Zinc lozenges Zyrtec   Constipation: Colace Ducolax suppositories Fleet enema Glycerin suppositories Metamucil Milk of magnesia Miralax Senokot Smooth move tea  Diarrhea: Kaopectate Imodium A-D  *NO pepto Bismol  Hemorrhoids: Anusol Anusol HC Preparation H Tucks  Indigestion: Tums Maalox Mylanta Zantac  Pepcid  Insomnia: Benadryl (alcohol free) 25mg every 6 hours as needed Tylenol PM Unisom, no Gelcaps  Leg Cramps: Tums MagGel  Nausea/Vomiting:  Bonine Dramamine Emetrol Ginger extract Sea bands Meclizine  Nausea medication to take during pregnancy:  Unisom (doxylamine succinate 25 mg tablets) Take one tablet daily at bedtime. If symptoms are not adequately controlled, the dose can be increased to a maximum recommended dose of two tablets daily (1/2 tablet in the morning, 1/2 tablet mid-afternoon and one at bedtime). Vitamin B6 100mg tablets. Take one tablet twice a day (up to 200 mg per day).  Skin Rashes: Aveeno products Benadryl cream or 25mg every 6 hours as needed Calamine Lotion 1% cortisone cream  Yeast infection: Gyne-lotrimin 7 Monistat 7   **If taking multiple medications, please check labels to avoid duplicating the same active ingredients **take medication as directed on  the label ** Do not exceed 4000 mg of tylenol in 24 hours **Do not take medications that contain aspirin or ibuprofen    First Trimester of Pregnancy The first trimester of pregnancy is from week 1 until the end of week 12 (months 1 through 3). A week after a sperm fertilizes an egg, the egg will implant on the wall of the uterus. This embryo will begin to develop into a baby. Genes from you and your partner are forming the baby. The female genes determine whether the baby is a boy or a girl. At 6-8 weeks, the eyes and face are formed, and the heartbeat can be seen on ultrasound. At the end of 12 weeks, all the baby's organs are formed.  Now that you are pregnant, you will want to do everything you can to have a healthy baby. Two of the most important things are to get good prenatal care and to follow your health care provider's instructions. Prenatal care is all the medical care you receive before the baby's birth. This care will help prevent, find, and treat any problems during the pregnancy and childbirth. BODY CHANGES Your body goes through many changes during pregnancy. The changes vary from woman to woman.   You may gain or lose a couple of pounds at first.  You may feel sick to your stomach (nauseous) and throw up (vomit). If the vomiting is uncontrollable, call your health care provider.  You may tire easily.  You may develop headaches that can be relieved by medicines approved by your health care provider.  You may urinate more often. Painful urination may mean you have a bladder infection.  You may develop heartburn as a result of your   pregnancy.  You may develop constipation because certain hormones are causing the muscles that push waste through your intestines to slow down.  You may develop hemorrhoids or swollen, bulging veins (varicose veins).  Your breasts may begin to grow larger and become tender. Your nipples may stick out more, and the tissue that surrounds them (areola)  may become darker.  Your gums may bleed and may be sensitive to brushing and flossing.  Dark spots or blotches (chloasma, mask of pregnancy) may develop on your face. This will likely fade after the baby is born.  Your menstrual periods will stop.  You may have a loss of appetite.  You may develop cravings for certain kinds of food.  You may have changes in your emotions from day to day, such as being excited to be pregnant or being concerned that something may go wrong with the pregnancy and baby.  You may have more vivid and strange dreams.  You may have changes in your hair. These can include thickening of your hair, rapid growth, and changes in texture. Some women also have hair loss during or after pregnancy, or hair that feels dry or thin. Your hair will most likely return to normal after your baby is born. WHAT TO EXPECT AT YOUR PRENATAL VISITS During a routine prenatal visit:  You will be weighed to make sure you and the baby are growing normally.  Your blood pressure will be taken.  Your abdomen will be measured to track your baby's growth.  The fetal heartbeat will be listened to starting around week 10 or 12 of your pregnancy.  Test results from any previous visits will be discussed. Your health care provider may ask you:  How you are feeling.  If you are feeling the baby move.  If you have had any abnormal symptoms, such as leaking fluid, bleeding, severe headaches, or abdominal cramping.  If you are using any tobacco products, including cigarettes, chewing tobacco, and electronic cigarettes.  If you have any questions. Other tests that may be performed during your first trimester include:  Blood tests to find your blood type and to check for the presence of any previous infections. They will also be used to check for low iron levels (anemia) and Rh antibodies. Later in the pregnancy, blood tests for diabetes will be done along with other tests if problems  develop.  Urine tests to check for infections, diabetes, or protein in the urine.  An ultrasound to confirm the proper growth and development of the baby.  An amniocentesis to check for possible genetic problems.  Fetal screens for spina bifida and Down syndrome.  You may need other tests to make sure you and the baby are doing well.  HIV (human immunodeficiency virus) testing. Routine prenatal testing includes screening for HIV, unless you choose not to have this test. HOME CARE INSTRUCTIONS  Medicines  Follow your health care provider's instructions regarding medicine use. Specific medicines may be either safe or unsafe to take during pregnancy.  Take your prenatal vitamins as directed.  If you develop constipation, try taking a stool softener if your health care provider approves. Diet  Eat regular, well-balanced meals. Choose a variety of foods, such as meat or vegetable-based protein, fish, milk and low-fat dairy products, vegetables, fruits, and whole grain breads and cereals. Your health care provider will help you determine the amount of weight gain that is right for you.  Avoid raw meat and uncooked cheese. These carry germs that can cause   birth defects in the baby.  Eating four or five small meals rather than three large meals a day may help relieve nausea and vomiting. If you start to feel nauseous, eating a few soda crackers can be helpful. Drinking liquids between meals instead of during meals also seems to help nausea and vomiting.  If you develop constipation, eat more high-fiber foods, such as fresh vegetables or fruit and whole grains. Drink enough fluids to keep your urine clear or pale yellow. Activity and Exercise  Exercise only as directed by your health care provider. Exercising will help you:  Control your weight.  Stay in shape.  Be prepared for labor and delivery.  Experiencing pain or cramping in the lower abdomen or low back is a good sign that you  should stop exercising. Check with your health care provider before continuing normal exercises.  Try to avoid standing for long periods of time. Move your legs often if you must stand in one place for a long time.  Avoid heavy lifting.  Wear low-heeled shoes, and practice good posture.  You may continue to have sex unless your health care provider directs you otherwise. Relief of Pain or Discomfort  Wear a good support bra for breast tenderness.   Take warm sitz baths to soothe any pain or discomfort caused by hemorrhoids. Use hemorrhoid cream if your health care provider approves.   Rest with your legs elevated if you have leg cramps or low back pain.  If you develop varicose veins in your legs, wear support hose. Elevate your feet for 15 minutes, 3-4 times a day. Limit salt in your diet. Prenatal Care  Schedule your prenatal visits by the twelfth week of pregnancy. They are usually scheduled monthly at first, then more often in the last 2 months before delivery.  Write down your questions. Take them to your prenatal visits.  Keep all your prenatal visits as directed by your health care provider. Safety  Wear your seat belt at all times when driving.  Make a list of emergency phone numbers, including numbers for family, friends, the hospital, and police and fire departments. General Tips  Ask your health care provider for a referral to a local prenatal education class. Begin classes no later than at the beginning of month 6 of your pregnancy.  Ask for help if you have counseling or nutritional needs during pregnancy. Your health care provider can offer advice or refer you to specialists for help with various needs.  Do not use hot tubs, steam rooms, or saunas.  Do not douche or use tampons or scented sanitary pads.  Do not cross your legs for long periods of time.  Avoid cat litter boxes and soil used by cats. These carry germs that can cause birth defects in the baby  and possibly loss of the fetus by miscarriage or stillbirth.  Avoid all smoking, herbs, alcohol, and medicines not prescribed by your health care provider. Chemicals in these affect the formation and growth of the baby.  Do not use any tobacco products, including cigarettes, chewing tobacco, and electronic cigarettes. If you need help quitting, ask your health care provider. You may receive counseling support and other resources to help you quit.  Schedule a dentist appointment. At home, brush your teeth with a soft toothbrush and be gentle when you floss. SEEK MEDICAL CARE IF:   You have dizziness.  You have mild pelvic cramps, pelvic pressure, or nagging pain in the abdominal area.  You have persistent   nausea, vomiting, or diarrhea.  You have a bad smelling vaginal discharge.  You have pain with urination.  You notice increased swelling in your face, hands, legs, or ankles. SEEK IMMEDIATE MEDICAL CARE IF:   You have a fever.  You are leaking fluid from your vagina.  You have spotting or bleeding from your vagina.  You have severe abdominal cramping or pain.  You have rapid weight gain or loss.  You vomit blood or material that looks like coffee grounds.  You are exposed to German measles and have never had them.  You are exposed to fifth disease or chickenpox.  You develop a severe headache.  You have shortness of breath.  You have any kind of trauma, such as from a fall or a car accident.   This information is not intended to replace advice given to you by your health care provider. Make sure you discuss any questions you have with your health care provider.   Document Released: 12/18/2000 Document Revised: 01/14/2014 Document Reviewed: 11/03/2012 Elsevier Interactive Patient Education 2016 [employer] Inc[employer]  

## 2015-02-27 NOTE — Progress Notes (Signed)
Subjective:  Darlene Hayes is a 29 y.o. G1P0000 at [redacted]w[redacted]d being seen today for ongoing prenatal care.  She is currently monitored for the following issues for this low-risk pregnancy and has Smoker; Obesity, unspecified; Supervision of low-risk pregnancy; Obesity affecting pregnancy, antepartum; and Tobacco use affecting pregnancy, antepartum on her problem list.  Patient reports Rash- present for months, itchy and present in groin.  Contractions: Not present. Vag. Bleeding: None.  Movement: Absent. Denies leaking of fluid.   The following portions of the patient's history were reviewed and updated as appropriate: allergies, current medications, past family history, past medical history, past social history, past surgical history and problem list. Problem list updated.  Objective:   Filed Vitals:   02/27/15 0812 02/27/15 0813  BP: 117/81   Pulse: 109   Height:  5' 3.5" (1.613 m)  Weight: 196 lb (88.905 kg)     Fetal Status: Fetal Heart Rate (bpm): 154   Movement: Absent     General:  Alert, oriented and cooperative. Patient is in no acute distress.  Skin: Skin is warm and dry. No rash noted.   Cardiovascular: Normal heart rate noted  Respiratory: Normal respiratory effort, no problems with respiration noted  Abdomen: Soft, gravid, appropriate for gestational age. Pain/Pressure: Absent     Pelvic: Vag. Bleeding: None     Cervical exam performed- [pap collected], cervix appears closed.         Extremities: Normal range of motion.  Edema: None  Mental Status: Normal mood and affect. Normal behavior. Normal judgment and thought content.   Urinalysis: Urine Protein: 1+ Urine Glucose: Negative  Assessment and Plan:  Pregnancy: G1P0000 at [redacted]w[redacted]d  1. Supervision of high risk pregnancy, antepartum, first trimester -add box - Prenatal Profile - Hemoglobinopathy evaluation - Culture, OB Urine - Cytology - PAP - GC/Chlamydia probe amp ([place])not at [facility] - [place] MFM OB COMP + 14 WK;  Future - Prescript Monitor Profile(19) - Quad Screen  2. Obesity affecting pregnancy, antepartum, first trimester Discussed weight gain of 11-20 lbs total and reviewed briefly healthy eating and increasing physical activity.   3. Tobacco use affecting pregnancy, antepartum Quit in early 1st trimester  4. Cutaneous candida - Nystatin powder x 2 weeks  Preterm labor symptoms and general obstetric precautions including but not limited to vaginal bleeding, contractions, leaking of fluid and fetal movement were reviewed in detail with the patient. Please refer to After Visit Summary for other counseling recommendations.  Return in about 4 weeks (around 03/27/2015) for Routine prenatal care.   Federico Flake, MD

## 2015-02-28 LAB — PRENATAL PROFILE (SOLSTAS)
Antibody Screen: NEGATIVE
BASOS ABS: 0 10*3/uL (ref 0.0–0.1)
Basophils Relative: 0 % (ref 0–1)
Eosinophils Absolute: 0.1 10*3/uL (ref 0.0–0.7)
Eosinophils Relative: 2 % (ref 0–5)
HEMATOCRIT: 30.9 % — AB (ref 36.0–46.0)
HEP B S AG: NEGATIVE
HIV: NONREACTIVE
Hemoglobin: 10.3 g/dL — ABNORMAL LOW (ref 12.0–15.0)
LYMPHS ABS: 1.2 10*3/uL (ref 0.7–4.0)
LYMPHS PCT: 20 % (ref 12–46)
MCH: 27.5 pg (ref 26.0–34.0)
MCHC: 33.3 g/dL (ref 30.0–36.0)
MCV: 82.4 fL (ref 78.0–100.0)
MPV: 9.6 fL (ref 8.6–12.4)
Monocytes Absolute: 0.5 10*3/uL (ref 0.1–1.0)
Monocytes Relative: 8 % (ref 3–12)
NEUTROS ABS: 4.3 10*3/uL (ref 1.7–7.7)
Neutrophils Relative %: 70 % (ref 43–77)
Platelets: 242 10*3/uL (ref 150–400)
RBC: 3.75 MIL/uL — AB (ref 3.87–5.11)
RDW: 22.3 % — AB (ref 11.5–15.5)
Rh Type: POSITIVE
Rubella: 3.81 Index — ABNORMAL HIGH (ref <0.90)
WBC: 6.2 10*3/uL (ref 4.0–10.5)

## 2015-02-28 LAB — AFP, QUAD SCREEN
AFP: 40.9 ng/mL
CURR GEST AGE: 14.2 wks.days
HCG TOTAL: 83.03 [IU]/mL
INH: 365.3 pg/mL
INTERPRETATION-AFP: NEGATIVE
MOM FOR AFP: 1.58
MoM for INH: 2.1
MoM for hCG: 1.54
OPEN SPINA BIFIDA: NEGATIVE
Osb Risk: 1:4450 {titer}
Tri 18 Scr Risk Est: NEGATIVE
Trisomy 18 (Edward) Syndrome Interp.: 1:119000 {titer}
UE3 MOM: 1.14
UE3 VALUE: 0.55 ng/mL

## 2015-02-28 LAB — GC/CHLAMYDIA PROBE AMP (~~LOC~~) NOT AT ARMC
Chlamydia: NEGATIVE
NEISSERIA GONORRHEA: NEGATIVE

## 2015-03-01 LAB — CULTURE, OB URINE

## 2015-03-01 LAB — HEMOGLOBINOPATHY EVALUATION
Hemoglobin Other: 0 %
Hgb A2 Quant: 2.3 % (ref 2.2–3.2)
Hgb A: 96.3 % — ABNORMAL LOW (ref 96.8–97.8)
Hgb F Quant: 1.4 % (ref 0.0–2.0)
Hgb S Quant: 0 %

## 2015-03-01 LAB — CYTOLOGY - PAP

## 2015-03-04 ENCOUNTER — Encounter: Payer: Self-pay | Admitting: Family Medicine

## 2015-03-04 DIAGNOSIS — F1290 Cannabis use, unspecified, uncomplicated: Secondary | ICD-10-CM | POA: Insufficient documentation

## 2015-03-04 LAB — PRESCRIPTION MONITORING PROFILE (19 PANEL)
AMPHETAMINE/METH: NEGATIVE ng/mL
BARBITURATE SCREEN, URINE: NEGATIVE ng/mL
BENZODIAZEPINE SCREEN, URINE: NEGATIVE ng/mL
Buprenorphine, Urine: NEGATIVE ng/mL
COCAINE METABOLITES: NEGATIVE ng/mL
Carisoprodol, Urine: NEGATIVE ng/mL
Creatinine, Urine: 390.07 mg/dL (ref >20.0)
Fentanyl, Ur: NEGATIVE ng/mL
MDMA URINE: NEGATIVE ng/mL
Meperidine, Ur: NEGATIVE ng/mL
Methadone Screen, Urine: NEGATIVE ng/mL
Methaqualone: NEGATIVE ng/mL
NITRITES URINE, INITIAL: NEGATIVE ug/mL
OPIATE SCREEN, URINE: NEGATIVE ng/mL
Oxycodone Screen, Ur: NEGATIVE ng/mL
PROPOXYPHENE: NEGATIVE ng/mL
Phencyclidine, Ur: NEGATIVE ng/mL
TAPENTADOLUR: NEGATIVE ng/mL
TRAMADOL UR: NEGATIVE ng/mL
ZOLPIDEM, URINE: NEGATIVE ng/mL
pH, Initial: 6.9 pH (ref 4.5–8.9)

## 2015-03-04 LAB — CANNABANOIDS (GC/LC/MS), URINE: THC-COOH (GC/LC/MS), ur confirm: 87 ng/mL — AB (ref <5)

## 2015-03-07 ENCOUNTER — Inpatient Hospital Stay (HOSPITAL_COMMUNITY)
Admission: AD | Admit: 2015-03-07 | Discharge: 2015-03-07 | Disposition: A | Discharge status: Home / Self Care | Payer: BLUE CROSS/BLUE SHIELD | Source: Ambulatory Visit | Attending: Obstetrics & Gynecology | Admitting: Obstetrics & Gynecology

## 2015-03-07 ENCOUNTER — Encounter (HOSPITAL_COMMUNITY): Payer: Self-pay | Admitting: *Deleted

## 2015-03-07 DIAGNOSIS — Z87891 Personal history of nicotine dependence: Secondary | ICD-10-CM | POA: Insufficient documentation

## 2015-03-07 DIAGNOSIS — O99332 Smoking (tobacco) complicating pregnancy, second trimester: Secondary | ICD-10-CM | POA: Diagnosis not present

## 2015-03-07 DIAGNOSIS — R51 Headache: Secondary | ICD-10-CM | POA: Diagnosis present

## 2015-03-07 DIAGNOSIS — F1290 Cannabis use, unspecified, uncomplicated: Secondary | ICD-10-CM | POA: Insufficient documentation

## 2015-03-07 DIAGNOSIS — O99322 Drug use complicating pregnancy, second trimester: Secondary | ICD-10-CM | POA: Insufficient documentation

## 2015-03-07 DIAGNOSIS — R0981 Nasal congestion: Secondary | ICD-10-CM | POA: Insufficient documentation

## 2015-03-07 DIAGNOSIS — O99211 Obesity complicating pregnancy, first trimester: Secondary | ICD-10-CM

## 2015-03-07 DIAGNOSIS — O99330 Smoking (tobacco) complicating pregnancy, unspecified trimester: Secondary | ICD-10-CM

## 2015-03-07 DIAGNOSIS — O99212 Obesity complicating pregnancy, second trimester: Secondary | ICD-10-CM | POA: Insufficient documentation

## 2015-03-07 DIAGNOSIS — J069 Acute upper respiratory infection, unspecified: Secondary | ICD-10-CM | POA: Diagnosis not present

## 2015-03-07 DIAGNOSIS — Z3491 Encounter for supervision of normal pregnancy, unspecified, first trimester: Secondary | ICD-10-CM

## 2015-03-07 DIAGNOSIS — R05 Cough: Secondary | ICD-10-CM | POA: Diagnosis not present

## 2015-03-07 DIAGNOSIS — O26892 Other specified pregnancy related conditions, second trimester: Secondary | ICD-10-CM | POA: Insufficient documentation

## 2015-03-07 DIAGNOSIS — Z3A15 15 weeks gestation of pregnancy: Secondary | ICD-10-CM | POA: Diagnosis not present

## 2015-03-07 NOTE — MAU Provider Note (Signed)
Chief Complaint: Headache and Nasal Congestion   First Provider Initiated Contact with Patient 03/07/15 1044        SUBJECTIVE HPI Darlene Hayes is a 30 y.o. G1P0000 at [redacted]w[redacted]d by LMP who presents to maternity admissions reporting sinus congestion, frontal headache since Sunday.  Has not taken anything for these symptoms because she did not know what was safe.  States does not like to take medicines.  Did not go to work today because she did not sleep well last night. She denies vaginal bleeding, vaginal itching/burning, urinary symptoms, dizziness, n/v, or fever/chills.    RN Note: Pt C/O HA for the past 2 days, feeling weak, coughing up mucus, stuffy nose. Denies fever. No vomiting or diarrhea.           Past Medical History  Diagnosis Date  . Abnormal Pap smear     abn paps since 2000. no additional testing done.   . Gonorrhea     treated in July 2012. hasn't been with that partner since.   . Chlamydia 07/2010    treated. hasn't been with that partner since.    Past Surgical History  Procedure Laterality Date  . No past surgeries     Social History   Social History  . Marital Status: Single    Spouse Name: N/A  . Number of Children: N/A  . Years of Education: N/A   Occupational History  . Not on file.   Social History Main Topics  . Smoking status: Former Smoker -- 0.15 packs/day    Types: Cigarettes  . Smokeless tobacco: Never Used  . Alcohol Use: No  . Drug Use: No  . Sexual Activity: Not Currently   Other Topics Concern  . Not on file   Social History Narrative   No current facility-administered medications on file prior to encounter.   Current Outpatient Prescriptions on File Prior to Encounter  Medication Sig Dispense Refill  . nystatin (MYCOSTATIN/NYSTOP) 100000 UNIT/GM POWD Apply powder topically twice daily for 2 weeks to treat skin rash 60 g 0  . Prenatal Vit-Fe Fumarate-FA (PRENATAL VITAMINS PLUS) 27-1 MG TABS Take 1 tablet by mouth  daily. 30 tablet 11   No Known Allergies  I have reviewed patient's Past Medical Hx, Surgical Hx, Family Hx, Social Hx, medications and allergies.   ROS:  Review of Systems Review of Systems  Constitutional: Negative for fever and chills.  Gastrointestinal: Negative for nausea, vomiting, abdominal pain, diarrhea and constipation.  Genitourinary: Negative for dysuria.  Musculoskeletal: Negative for back pain.  Neurological: Negative for dizziness and weakness.    Physical Exam  Patient Vitals for the past 24 hrs:  BP Temp Temp src Pulse Resp SpO2  03/07/15 1014 123/72 mmHg 97.9 F (36.6 C) Oral 104 18 100 %    Physical Exam  Constitutional: Well-developed, well-nourished female in no acute distress.  ENT:  Nasal congestion, some cobblestone of throat, nontender over sinuses Cardiac:  Regular rhythm, normal rate Respiratory:  Lungs CTAB, no wheezes GI: Abd soft, non-tender. Pos BS x 4 MS: Extremities nontender, no edema, normal ROM Neurologic: Alert and oriented x 4.  GU: Neg CVAT.  PELVIC EXAM: Cervix pink, visually closed, without lesion, scant white creamy discharge, vaginal walls and external genitalia normal Bimanual exam: Cervix 0/long/high, firm, anterior, neg CMT, uterus nontender, nonenlarged, adnexa without tenderness, enlargement, or mass  FHT 145 by doppler  LAB RESULTS No results found for this or any previous visit (from the past 24 hour(s)).  A/POS/-- (02/20 0945)  IMAGING No results found.  MAU Management/MDM: Discussed viral upper respiratory infections. Discussed usual course and expected recovery within the next few days. Discussed cold care and given list of approved meds she can use. Recommend increased intake of fluids to thin mucous. Reviewed signs to report including fever, wheezing, worsening of symptoms or difficulty breathing.  Pt stable at time of discharge.  ASSESSMENT 1. Marijuana smoker (HCC)   2. Supervision of low-risk pregnancy,  first trimester   3. Obesity affecting pregnancy, antepartum, first trimester   4. Tobacco use affecting pregnancy, antepartum     PLAN Discharge home Advised she can use Mucinex, saline nasal spray and cough drops. Rest and fluids Follow up in OB clinic    Medication List    ASK your doctor about these medications        nystatin 100000 UNIT/GM Powd  Apply powder topically twice daily for 2 weeks to treat skin rash     PRENATAL VITAMINS PLUS 27-1 MG Tabs  Take 1 tablet by mouth daily.         Encouraged to return here or to other Urgent Care/ED if she develops worsening of symptoms, increase in pain, fever, or other concerning symptoms.    Wynelle Bourgeois CNM, MSN Certified Nurse-Midwife 03/07/2015  10:45 AM

## 2015-03-07 NOTE — Discharge Instructions (Signed)
Upper Respiratory Infection, Adult Most upper respiratory infections (URIs) are a viral infection of the air passages leading to the lungs. A URI affects the nose, throat, and upper air passages. The most common type of URI is nasopharyngitis and is typically referred to as "the common cold." URIs run their course and usually go away on their own. Most of the time, a URI does not require medical attention, but sometimes a bacterial infection in the upper airways can follow a viral infection. This is called a secondary infection. Sinus and middle ear infections are common types of secondary upper respiratory infections. Bacterial pneumonia can also complicate a URI. A URI can worsen asthma and chronic obstructive pulmonary disease (COPD). Sometimes, these complications can require emergency medical care and may be life threatening.  CAUSES Almost all URIs are caused by viruses. A virus is a type of germ and can spread from one person to another.  RISKS FACTORS You may be at risk for a URI if:   You smoke.   You have chronic heart or lung disease.  You have a weakened defense (immune) system.   You are very young or very old.   You have nasal allergies or asthma.  You work in crowded or poorly ventilated areas.  You work in health care facilities or schools. SIGNS AND SYMPTOMS  Symptoms typically develop 2-3 days after you come in contact with a cold virus. Most viral URIs last 7-10 days. However, viral URIs from the influenza virus (flu virus) can last 14-18 days and are typically more severe. Symptoms may include:   Runny or stuffy (congested) nose.   Sneezing.   Cough.   Sore throat.   Headache.   Fatigue.   Fever.   Loss of appetite.   Pain in your forehead, behind your eyes, and over your cheekbones (sinus pain).  Muscle aches.  DIAGNOSIS  Your health care provider may diagnose a URI by:  Physical exam.  Tests to check that your symptoms are not due to  another condition such as:  Strep throat.  Sinusitis.  Pneumonia.  Asthma. TREATMENT  A URI goes away on its own with time. It cannot be cured with medicines, but medicines may be prescribed or recommended to relieve symptoms. Medicines may help:  Reduce your fever.  Reduce your cough.  Relieve nasal congestion. HOME CARE INSTRUCTIONS   Take medicines only as directed by your health care provider.   Gargle warm saltwater or take cough drops to comfort your throat as directed by your health care provider.  Use a warm mist humidifier or inhale steam from a shower to increase air moisture. This may make it easier to breathe.  Drink enough fluid to keep your urine clear or pale yellow.   Eat soups and other clear broths and maintain good nutrition.   Rest as needed.   Return to work when your temperature has returned to normal or as your health care provider advises. You may need to stay home longer to avoid infecting others. You can also use a face mask and careful hand washing to prevent spread of the virus.  Increase the usage of your inhaler if you have asthma.   Do not use any tobacco products, including cigarettes, chewing tobacco, or electronic cigarettes. If you need help quitting, ask your health care provider. PREVENTION  The best way to protect yourself from getting a cold is to practice good hygiene.   Avoid oral or hand contact with people with cold   symptoms.   Wash your hands often if contact occurs.  There is no clear evidence that vitamin C, vitamin E, echinacea, or exercise reduces the chance of developing a cold. However, it is always recommended to get plenty of rest, exercise, and practice good nutrition.  SEEK MEDICAL CARE IF:   You are getting worse rather than better.   Your symptoms are not controlled by medicine.   You have chills.  You have worsening shortness of breath.  You have brown or red mucus.  You have yellow or brown nasal  discharge.  You have pain in your face, especially when you bend forward.  You have a fever.  You have swollen neck glands.  You have pain while swallowing.  You have white areas in the back of your throat. SEEK IMMEDIATE MEDICAL CARE IF:   You have severe or persistent:  Headache.  Ear pain.  Sinus pain.  Chest pain.  You have chronic lung disease and any of the following:  Wheezing.  Prolonged cough.  Coughing up blood.  A change in your usual mucus.  You have a stiff neck.  You have changes in your:  Vision.  Hearing.  Thinking.  Mood. MAKE SURE YOU:   Understand these instructions.  Will watch your condition.  Will get help right away if you are not doing well or get worse.   This information is not intended to replace advice given to you by your health care provider. Make sure you discuss any questions you have with your health care provider.   Document Released: 06/19/2000 Document Revised: 05/10/2014 Document Reviewed: 03/31/2013 Elsevier Interactive Patient Education 2016 [employer]  

## 2015-03-07 NOTE — MAU Note (Signed)
Pt C/O HA for the past 2 days, feeling weak, coughing up mucus, stuffy nose.  Denies fever.  No vomiting or diarrhea.

## 2015-03-27 ENCOUNTER — Ambulatory Visit (HOSPITAL_COMMUNITY)
Admission: RE | Admit: 2015-03-27 | Discharge: 2015-03-27 | Disposition: A | Discharge status: Home / Self Care | Payer: BLUE CROSS/BLUE SHIELD | Source: Ambulatory Visit | Attending: Family Medicine | Admitting: Family Medicine

## 2015-03-27 ENCOUNTER — Encounter: Payer: Self-pay | Admitting: Family Medicine

## 2015-03-27 ENCOUNTER — Ambulatory Visit (INDEPENDENT_AMBULATORY_CARE_PROVIDER_SITE_OTHER): Payer: BLUE CROSS/BLUE SHIELD | Admitting: Family Medicine

## 2015-03-27 ENCOUNTER — Other Ambulatory Visit: Payer: Self-pay | Admitting: General Practice

## 2015-03-27 VITALS — BP 115/59 | HR 102 | Temp 98.3°F | Wt 205.8 lb

## 2015-03-27 DIAGNOSIS — F17290 Nicotine dependence, other tobacco product, uncomplicated: Secondary | ICD-10-CM

## 2015-03-27 DIAGNOSIS — Z1389 Encounter for screening for other disorder: Secondary | ICD-10-CM

## 2015-03-27 DIAGNOSIS — F1290 Cannabis use, unspecified, uncomplicated: Secondary | ICD-10-CM

## 2015-03-27 DIAGNOSIS — Z3A18 18 weeks gestation of pregnancy: Secondary | ICD-10-CM

## 2015-03-27 DIAGNOSIS — Z3492 Encounter for supervision of normal pregnancy, unspecified, second trimester: Secondary | ICD-10-CM

## 2015-03-27 DIAGNOSIS — O99322 Drug use complicating pregnancy, second trimester: Secondary | ICD-10-CM | POA: Diagnosis not present

## 2015-03-27 DIAGNOSIS — Z3402 Encounter for supervision of normal first pregnancy, second trimester: Secondary | ICD-10-CM | POA: Insufficient documentation

## 2015-03-27 DIAGNOSIS — E669 Obesity, unspecified: Secondary | ICD-10-CM

## 2015-03-27 DIAGNOSIS — O99212 Obesity complicating pregnancy, second trimester: Secondary | ICD-10-CM

## 2015-03-27 DIAGNOSIS — F1220 Cannabis dependence, uncomplicated: Secondary | ICD-10-CM

## 2015-03-27 DIAGNOSIS — O99330 Smoking (tobacco) complicating pregnancy, unspecified trimester: Secondary | ICD-10-CM

## 2015-03-27 DIAGNOSIS — O0991 Supervision of high risk pregnancy, unspecified, first trimester: Secondary | ICD-10-CM

## 2015-03-27 LAB — POCT URINALYSIS DIP (DEVICE)
BILIRUBIN URINE: NEGATIVE
GLUCOSE, UA: NEGATIVE mg/dL
Hgb urine dipstick: NEGATIVE
KETONES UR: NEGATIVE mg/dL
LEUKOCYTES UA: NEGATIVE
NITRITE: NEGATIVE
PH: 7 (ref 5.0–8.0)
Protein, ur: NEGATIVE mg/dL
Specific Gravity, Urine: 1.02 (ref 1.005–1.030)
Urobilinogen, UA: 1 mg/dL (ref 0.0–1.0)

## 2015-03-27 MED ORDER — FERROUS SULFATE 325 (65 FE) MG PO TABS: 325.0000 mg | ORAL_TABLET | Freq: Every day | ORAL | Status: DC

## 2015-03-27 NOTE — Progress Notes (Signed)
Breastfeeding tip of the week reviewed. 

## 2015-03-27 NOTE — Progress Notes (Signed)
OB fellow attestation:  I have seen and examined this patient; I agree with the documentation in the resident's note. We discussed the patient together and formulated a plan  Federico FlakeKimberly Niles Akeya Ryther, MD 1:44 PM

## 2015-03-27 NOTE — Progress Notes (Signed)
Subjective:  Darlene Hayes is a 30 y.o. G1P0000 at 2148w2d being seen today for ongoing prenatal care.  She is currently monitored for the following issues for this low-risk pregnancy and has Smoker; Obesity, unspecified; Supervision of low-risk pregnancy; Obesity affecting pregnancy, antepartum; Tobacco use affecting pregnancy, antepartum; and Marijuana smoker (HCC) on her problem list.  Patient reports no complaints.  Contractions: Not present. Vag. Bleeding: None.  Movement: Absent. Denies leaking of fluid.  The following portions of the patient's history were reviewed and updated as appropriate: allergies, current medications, past family history, past medical history, past social history, past surgical history and problem list. Problem list updated.  Objective:   Filed Vitals:   03/27/15 1019  BP: 115/59  Pulse: 102  Temp: 98.3 F (36.8 C)  Weight: 205 lb 12.8 oz (93.35 kg)    Fetal Status: Fetal Heart Rate (bpm): 156   Movement: Absent     General:  Alert, oriented and cooperative. Patient is in no acute distress.  Skin: Skin is warm and dry. No rash noted.   Cardiovascular: Normal heart rate noted  Respiratory: Normal respiratory effort, no problems with respiration noted  Abdomen: Soft, gravid, appropriate for gestational age. Pain/Pressure: Present     Pelvic: Vag. Bleeding: None     Cervical exam deferred        Extremities: Normal range of motion.  Edema: None  Mental Status: Normal mood and affect. Normal behavior. Normal judgment and thought content.   Urinalysis:      Assessment and Plan:  Pregnancy: G1P0000 at 6948w2d  1. Marijuana smoker (HCC) Denies current smoking use. States last use was in December. However, stated that her urine will probably still be positive.   2. Supervision of low-risk pregnancy, second trimester No concerns. Routine prenatal care. Has anatomy ultrasound scheduled for today. Letter given for work for breaks to sit down and avoiding  heavy lifting.   3. Obesity affecting pregnancy, antepartum, second trimester Continue to monitor weight. Patient with 9lb weight gain since last appointment. Discussed appropriate weight gain in pregnancy and healthy eating.   4. Tobacco use affecting pregnancy, antepartum Denies smoking currently.   Preterm labor symptoms and general obstetric precautions including but not limited to vaginal bleeding, contractions, leaking of fluid and fetal movement were reviewed in detail with the patient. Please refer to After Visit Summary for other counseling recommendations.   Return in about 4 weeks (around 04/24/2015) for Routine prenatal care.   Pincus LargeJazma Y Angely Dietz, DO PGY-2

## 2015-03-27 NOTE — Patient Instructions (Signed)

## 2015-04-24 ENCOUNTER — Encounter: Payer: Self-pay | Admitting: Obstetrics and Gynecology

## 2015-04-24 ENCOUNTER — Ambulatory Visit (INDEPENDENT_AMBULATORY_CARE_PROVIDER_SITE_OTHER): Payer: BLUE CROSS/BLUE SHIELD | Admitting: Obstetrics and Gynecology

## 2015-04-24 VITALS — BP 121/63 | HR 102 | Temp 98.3°F | Wt 215.6 lb

## 2015-04-24 DIAGNOSIS — E669 Obesity, unspecified: Secondary | ICD-10-CM

## 2015-04-24 DIAGNOSIS — F1290 Cannabis use, unspecified, uncomplicated: Secondary | ICD-10-CM

## 2015-04-24 DIAGNOSIS — O99322 Drug use complicating pregnancy, second trimester: Secondary | ICD-10-CM

## 2015-04-24 DIAGNOSIS — F1220 Cannabis dependence, uncomplicated: Secondary | ICD-10-CM

## 2015-04-24 DIAGNOSIS — O99212 Obesity complicating pregnancy, second trimester: Secondary | ICD-10-CM

## 2015-04-24 DIAGNOSIS — Z3492 Encounter for supervision of normal pregnancy, unspecified, second trimester: Secondary | ICD-10-CM

## 2015-04-24 LAB — POCT URINALYSIS DIP (DEVICE)
Bilirubin Urine: NEGATIVE
GLUCOSE, UA: NEGATIVE mg/dL
Hgb urine dipstick: NEGATIVE
Ketones, ur: NEGATIVE mg/dL
Nitrite: NEGATIVE
PROTEIN: NEGATIVE mg/dL
SPECIFIC GRAVITY, URINE: 1.025 (ref 1.005–1.030)
UROBILINOGEN UA: 1 mg/dL (ref 0.0–1.0)
pH: 6.5 (ref 5.0–8.0)

## 2015-04-24 NOTE — Progress Notes (Signed)
Subjective:  Darlene Hayes is a 30 y.o. G1P0000 at 7050w2d being seen today for ongoing prenatal care.  She is currently monitored for the following issues for this low-risk pregnancy and has Smoker; Obesity, unspecified; Supervision of low-risk pregnancy; Obesity affecting pregnancy, antepartum; Tobacco use affecting pregnancy, antepartum; and Marijuana smoker (HCC) on her problem list.  Patient reports no complaints.  Contractions: Not present. Vag. Bleeding: None.  Movement: Present. Denies leaking of fluid.   The following portions of the patient's history were reviewed and updated as appropriate: allergies, current medications, past family history, past medical history, past social history, past surgical history and problem list. Problem list updated.  Objective:   Filed Vitals:   04/24/15 0916  BP: 121/63  Pulse: 102  Temp: 98.3 F (36.8 C)  Weight: 215 lb 9.6 oz (97.796 kg)    Fetal Status: Fetal Heart Rate (bpm): 159   Movement: Present     General:  Alert, oriented and cooperative. Patient is in no acute distress.  Skin: Skin is warm and dry. No rash noted.   Cardiovascular: Normal heart rate noted  Respiratory: Normal respiratory effort, no problems with respiration noted  Abdomen: Soft, gravid, appropriate for gestational age. Pain/Pressure: Absent     Pelvic: Vag. Bleeding: None     Cervical exam deferred        Extremities: Normal range of motion.  Edema: None  Mental Status: Normal mood and affect. Normal behavior. Normal judgment and thought content.   Urinalysis: Urine Protein: Negative Urine Glucose: Negative  Assessment and Plan:  Pregnancy: G1P0000 at 2550w2d  1. Supervision of low-risk pregnancy, second trimester 1 hour glucola and labs at next visit  2. Obesity affecting pregnancy, antepartum, second trimester  Discussed excessive weight gain in pregnancy. Patient gained 10 pounds since her last visit. Discussed consuming more fruits/vegetables and  bringing her own lunch to work. Also discussed incorporating exercise every day, at least 30 minutes of walking daily. Patient states that she will try  3. Marijuana smoker (HCC)   Preterm labor symptoms and general obstetric precautions including but not limited to vaginal bleeding, contractions, leaking of fluid and fetal movement were reviewed in detail with the patient. Please refer to After Visit Summary for other counseling recommendations.  Return in about 4 weeks (around 05/22/2015) for ob f/u and 1 hr glucola.   Catalina AntiguaPeggy Dina Mobley, MD

## 2015-05-22 ENCOUNTER — Ambulatory Visit (INDEPENDENT_AMBULATORY_CARE_PROVIDER_SITE_OTHER): Payer: BLUE CROSS/BLUE SHIELD | Admitting: Family Medicine

## 2015-05-22 ENCOUNTER — Encounter: Payer: Self-pay | Admitting: Family Medicine

## 2015-05-22 VITALS — BP 117/74 | HR 97 | Temp 98.3°F | Wt 219.2 lb

## 2015-05-22 DIAGNOSIS — E669 Obesity, unspecified: Secondary | ICD-10-CM

## 2015-05-22 DIAGNOSIS — Z3493 Encounter for supervision of normal pregnancy, unspecified, third trimester: Secondary | ICD-10-CM

## 2015-05-22 DIAGNOSIS — D649 Anemia, unspecified: Secondary | ICD-10-CM

## 2015-05-22 DIAGNOSIS — F1220 Cannabis dependence, uncomplicated: Secondary | ICD-10-CM

## 2015-05-22 DIAGNOSIS — O99213 Obesity complicating pregnancy, third trimester: Secondary | ICD-10-CM

## 2015-05-22 DIAGNOSIS — O99013 Anemia complicating pregnancy, third trimester: Secondary | ICD-10-CM

## 2015-05-22 DIAGNOSIS — F1290 Cannabis use, unspecified, uncomplicated: Secondary | ICD-10-CM

## 2015-05-22 LAB — POCT URINALYSIS DIP (DEVICE)
BILIRUBIN URINE: NEGATIVE
GLUCOSE, UA: NEGATIVE mg/dL
KETONES UR: NEGATIVE mg/dL
LEUKOCYTES UA: NEGATIVE
Nitrite: NEGATIVE
PROTEIN: NEGATIVE mg/dL
Specific Gravity, Urine: <=1.005 (ref 1.005–1.030)
Urobilinogen, UA: 0.2 mg/dL (ref 0.0–1.0)
pH: 5.5 (ref 5.0–8.0)

## 2015-05-22 LAB — CBC
HCT: 27.4 % — ABNORMAL LOW (ref 35.0–45.0)
Hemoglobin: 8.7 g/dL — ABNORMAL LOW (ref 11.7–15.5)
MCH: 26.5 pg — ABNORMAL LOW (ref 27.0–33.0)
MCHC: 31.8 g/dL — ABNORMAL LOW (ref 32.0–36.0)
MCV: 83.5 fL (ref 80.0–100.0)
MPV: 9.5 fL (ref 7.5–12.5)
PLATELETS: 248 10*3/uL (ref 140–400)
RBC: 3.28 MIL/uL — ABNORMAL LOW (ref 3.80–5.10)
RDW: 16.8 % — AB (ref 11.0–15.0)
WBC: 8 10*3/uL (ref 3.8–10.8)

## 2015-05-22 LAB — HIV ANTIBODY (ROUTINE TESTING W REFLEX): HIV: NONREACTIVE

## 2015-05-22 NOTE — Progress Notes (Signed)
Trace Hgb in urine

## 2015-05-22 NOTE — Progress Notes (Signed)
Subjective:  Darlene Hayes is a 30 y.o. G1P0000 at 297w2d being seen today for ongoing prenatal care.  She is currently monitored for the following issues for this low-risk pregnancy and has Smoker; Obesity, unspecified; Supervision of low-risk pregnancy; Obesity affecting pregnancy, antepartum; Tobacco use affecting pregnancy, antepartum; Marijuana smoker (HCC); and Anemia affecting pregnancy in third trimester on her problem list.  Patient reports no complaints.  Contractions: Not present. Vag. Bleeding: None.  Movement: Present. Denies leaking of fluid.   The following portions of the patient's history were reviewed and updated as appropriate: allergies, current medications, past family history, past medical history, past social history, past surgical history and problem list. Problem list updated.  Objective:   Filed Vitals:   05/22/15 1130  BP: 117/74  Pulse: 97  Temp: 98.3 F (36.8 C)  Weight: 219 lb 3.2 oz (99.428 kg)    Fetal Status: Fetal Heart Rate (bpm): 150 Fundal Height: 30 cm Movement: Present     General:  Alert, oriented and cooperative. Patient is in no acute distress.  Skin: Skin is warm and dry. No rash noted.   Cardiovascular: Normal heart rate noted  Respiratory: Normal respiratory effort, no problems with respiration noted  Abdomen: Soft, gravid, appropriate for gestational age. Pain/Pressure: Present     Pelvic: Vag. Bleeding: None     Cervical exam deferred        Extremities: Normal range of motion.  Edema: Trace  Mental Status: Normal mood and affect. Normal behavior. Normal judgment and thought content.   Urinalysis: Urine Protein: Negative Urine Glucose: Negative  Assessment and Plan:  Pregnancy: G1P0000 at 207w2d  1. Supervision of low-risk pregnancy, third trimester -updated box  2. Marijuana smoker (HCC)   3. Obesity affecting pregnancy, antepartum, third trimester Discussed recommended weight gain  Preterm labor symptoms and general  obstetric precautions including but not limited to vaginal bleeding, contractions, leaking of fluid and fetal movement were reviewed in detail with the patient. Please refer to After Visit Summary for other counseling recommendations.  Return in about 4 weeks (around 06/19/2015) for Routine prenatal care.   Federico FlakeKimberly Niles Khari Lett, MD

## 2015-05-22 NOTE — Patient Instructions (Signed)
Back Pain in Pregnancy Back pain during pregnancy is common. It happens in about half of all pregnancies. It is important for you and your baby that you remain active during your pregnancy.If you feel that back pain is not allowing you to remain active or sleep well, it is time to see your caregiver. Back pain may be caused by several factors related to changes during your pregnancy.Fortunately, unless you had trouble with your back before your pregnancy, the pain is likely to get better after you deliver. Low back pain usually occurs between the fifth and seventh months of pregnancy. It can, however, happen in the first couple months. Factors that increase the risk of back problems include:   Previous back problems.  Injury to your back.  Having twins or multiple births.  A chronic cough.  Stress.  Job-related repetitive motions.  Muscle or spinal disease in the back.  Family history of back problems, ruptured (herniated) discs, or osteoporosis.  Depression, anxiety, and panic attacks. CAUSES   When you are pregnant, your body produces a hormone called relaxin. This hormonemakes the ligaments connecting the low back and pubic bones more flexible. This flexibility allows the baby to be delivered more easily. When your ligaments are loose, your muscles need to work harder to support your back. Soreness in your back can come from tired muscles. Soreness can also come from back tissues that are irritated since they are receiving less support.  As the baby grows, it puts pressure on the nerves and blood vessels in your pelvis. This can cause back pain.  As the baby grows and gets heavier during pregnancy, the uterus pushes the stomach muscles forward and changes your center of gravity. This makes your back muscles work harder to maintain good posture. SYMPTOMS  Lumbar pain during pregnancy Lumbar pain during pregnancy usually occurs at or above the waist in the center of the back. There  may be pain and numbness that radiates into your leg or foot. This is similar to low back pain experienced by non-pregnant women. It usually increases with sitting for long periods of time, standing, or repetitive lifting. Tenderness may also be present in the muscles along your upper back. Posterior pelvic pain during pregnancy Pain in the back of the pelvis is more common than lumbar pain in pregnancy. It is a deep pain felt in your side at the waistline, or across the tailbone (sacrum), or in both places. You may have pain on one or both sides. This pain can also go into the buttocks and backs of the upper thighs. Pubic and groin pain may also be present. The pain does not quickly resolve with rest, and morning stiffness may also be present. Pelvic pain during pregnancy can be brought on by most activities. A high level of fitness before and during pregnancy may or may not prevent this problem. Labor pain is usually 1 to 2 minutes apart, lasts for about 1 minute, and involves a bearing down feeling or pressure in your pelvis. However, if you are at term with the pregnancy, constant low back pain can be the beginning of early labor, and you should be aware of this. DIAGNOSIS  X-rays of the back should not be done during the first 12 to 14 weeks of the pregnancy and only when absolutely necessary during the rest of the pregnancy. MRIs do not give off radiation and are safe during pregnancy. MRIs also should only be done when absolutely necessary. HOME CARE INSTRUCTIONS  Exercise   as directed by your caregiver. Exercise is the most effective way to prevent or manage back pain. If you have a back problem, it is especially important to avoid sports that require sudden body movements. Swimming and walking are great activities.  Do not stand in one place for long periods of time.  Do not wear high heels.  Sit in chairs with good posture. Use a pillow on your lower back if necessary. Make sure your head  rests over your shoulders and is not hanging forward.  Try sleeping on your side, preferably the left side, with a pillow or two between your legs. If you are sore after a night's rest, your bedmay betoo soft.Try placing a board between your mattress and box spring.  Listen to your body when lifting.If you are experiencing pain, ask for help or try bending yourknees more so you can use your leg muscles rather than your back muscles. Squat down when picking up something from the floor. Do not bend over.  Eat a healthy diet. Try to gain weight within your caregiver's recommendations.  Use heat or cold packs 3 to 4 times a day for 15 minutes to help with the pain.  Only take over-the-counter or prescription medicines for pain, discomfort, or fever as directed by your caregiver. Sudden (acute) back pain  Use bed rest for only the most extreme, acute episodes of back pain. Prolonged bed rest over 48 hours will aggravate your condition.  Ice is very effective for acute conditions.  Put ice in a plastic bag.  Place a towel between your skin and the bag.  Leave the ice on for 10 to 20 minutes every 2 hours, or as needed.  Using heat packs for 30 minutes prior to activities is also helpful. Continued back pain See your caregiver if you have continued problems. Your caregiver can help or refer you for appropriate physical therapy. With conditioning, most back problems can be avoided. Sometimes, a more serious issue may be the cause of back pain. You should be seen right away if new problems seem to be developing. Your caregiver may recommend:  A maternity girdle.  An elastic sling.  A back brace.  A massage therapist or acupuncture. SEEK MEDICAL CARE IF:   You are not able to do most of your daily activities, even when taking the pain medicine you were given.  You need a referral to a physical therapist or chiropractor.  You want to try acupuncture. SEEK IMMEDIATE MEDICAL CARE  IF:  You develop numbness, tingling, weakness, or problems with the use of your arms or legs.  You develop severe back pain that is no longer relieved with medicines.  You have a sudden change in bowel or bladder control.  You have increasing pain in other areas of the body.  You develop shortness of breath, dizziness, or fainting.  You develop nausea, vomiting, or sweating.  You have back pain which is similar to labor pains.  You have back pain along with your water breaking or vaginal bleeding.  You have back pain or numbness that travels down your leg.  Your back pain developed after you fell.  You develop pain on one side of your back. You may have a kidney stone.  You see blood in your urine. You may have a bladder infection or kidney stone.  You have back pain with blisters. You may have shingles. Back pain is fairly common during pregnancy but should not be accepted as just part of   the process. Back pain should always be treated as soon as possible. This will make your pregnancy as pleasant as possible.   This information is not intended to replace advice given to you by your health care provider. Make sure you discuss any questions you have with your health care provider.   Document Released: 04/03/2005 Document Revised: 03/18/2011 Document Reviewed: 05/15/2010 Elsevier Interactive Patient Education [employer] Third Trimester of Pregnancy The third trimester is from week 29 through week 42, months 7 through 9. The third trimester is a time when the fetus is growing rapidly. At the end of the ninth month, the fetus is about 20 inches in length and weighs 6-10 pounds.  BODY CHANGES Your body goes through many changes during pregnancy. The changes vary from woman to woman.   Your weight will continue to increase. You can expect to gain 25-35 pounds (11-16 kg) by the end of the pregnancy.  You may begin to get stretch marks on your hips, abdomen, and  breasts.  You may urinate more often because the fetus is moving lower into your pelvis and pressing on your bladder.  You may develop or continue to have heartburn as a result of your pregnancy.  You may develop constipation because certain hormones are causing the muscles that push waste through your intestines to slow down.  You may develop hemorrhoids or swollen, bulging veins (varicose veins).  You may have pelvic pain because of the weight gain and pregnancy hormones relaxing your joints between the bones in your pelvis. Backaches may result from overexertion of the muscles supporting your posture.  You may have changes in your hair. These can include thickening of your hair, rapid growth, and changes in texture. Some women also have hair loss during or after pregnancy, or hair that feels dry or thin. Your hair will most likely return to normal after your baby is born.  Your breasts will continue to grow and be tender. A yellow discharge may leak from your breasts called colostrum.  Your belly button may stick out.  You may feel short of breath because of your expanding uterus.  You may notice the fetus "dropping," or moving lower in your abdomen.  You may have a bloody mucus discharge. This usually occurs a few days to a week before labor begins.  Your cervix becomes thin and soft (effaced) near your due date. WHAT TO EXPECT AT YOUR PRENATAL EXAMS  You will have prenatal exams every 2 weeks until week 36. Then, you will have weekly prenatal exams. During a routine prenatal visit:  You will be weighed to make sure you and the fetus are growing normally.  Your blood pressure is taken.  Your abdomen will be measured to track your baby's growth.  The fetal heartbeat will be listened to.  Any test results from the previous visit will be discussed.  You may have a cervical check near your due date to see if you have effaced. At around 36 weeks, your caregiver will check your  cervix. At the same time, your caregiver will also perform a test on the secretions of the vaginal tissue. This test is to determine if a type of bacteria, Group B streptococcus, is present. Your caregiver will explain this further. Your caregiver may ask you:  What your birth plan is.  How you are feeling.  If you are feeling the baby move.  If you have had any abnormal symptoms, such as leaking fluid, bleeding, severe headaches, or  abdominal cramping.  If you are using any tobacco products, including cigarettes, chewing tobacco, and electronic cigarettes.  If you have any questions. Other tests or screenings that may be performed during your third trimester include:  Blood tests that check for low iron levels (anemia).  Fetal testing to check the health, activity level, and growth of the fetus. Testing is done if you have certain medical conditions or if there are problems during the pregnancy.  HIV (human immunodeficiency virus) testing. If you are at high risk, you may be screened for HIV during your third trimester of pregnancy. FALSE LABOR You may feel small, irregular contractions that eventually go away. These are called Braxton Hicks contractions, or false labor. Contractions may last for hours, days, or even weeks before true labor sets in. If contractions come at regular intervals, intensify, or become painful, it is best to be seen by your caregiver.  SIGNS OF LABOR   Menstrual-like cramps.  Contractions that are 5 minutes apart or less.  Contractions that start on the top of the uterus and spread down to the lower abdomen and back.  A sense of increased pelvic pressure or back pain.  A watery or bloody mucus discharge that comes from the vagina. If you have any of these signs before the 37th week of pregnancy, call your caregiver right away. You need to go to the hospital to get checked immediately. HOME CARE INSTRUCTIONS   Avoid all smoking, herbs, alcohol, and  unprescribed drugs. These chemicals affect the formation and growth of the baby.  Do not use any tobacco products, including cigarettes, chewing tobacco, and electronic cigarettes. If you need help quitting, ask your health care provider. You may receive counseling support and other resources to help you quit.  Follow your caregiver's instructions regarding medicine use. There are medicines that are either safe or unsafe to take during pregnancy.  Exercise only as directed by your caregiver. Experiencing uterine cramps is a good sign to stop exercising.  Continue to eat regular, healthy meals.  Wear a good support bra for breast tenderness.  Do not use hot tubs, steam rooms, or saunas.  Wear your seat belt at all times when driving.  Avoid raw meat, uncooked cheese, cat litter boxes, and soil used by cats. These carry germs that can cause birth defects in the baby.  Take your prenatal vitamins.  Take 1500-2000 mg of calcium daily starting at the 20th week of pregnancy until you deliver your baby.  Try taking a stool softener (if your caregiver approves) if you develop constipation. Eat more high-fiber foods, such as fresh vegetables or fruit and whole grains. Drink plenty of fluids to keep your urine clear or pale yellow.  Take warm sitz baths to soothe any pain or discomfort caused by hemorrhoids. Use hemorrhoid cream if your caregiver approves.  If you develop varicose veins, wear support hose. Elevate your feet for 15 minutes, 3-4 times a day. Limit salt in your diet.  Avoid heavy lifting, wear low heal shoes, and practice good posture.  Rest a lot with your legs elevated if you have leg cramps or low back pain.  Visit your dentist if you have not gone during your pregnancy. Use a soft toothbrush to brush your teeth and be gentle when you floss.  A sexual relationship may be continued unless your caregiver directs you otherwise.  Do not travel far distances unless it is  absolutely necessary and only with the approval of your caregiver.  Take  prenatal classes to understand, practice, and ask questions about the labor and delivery.  Make a trial run to the hospital.  Pack your hospital bag.  Prepare the baby's nursery.  Continue to go to all your prenatal visits as directed by your caregiver. SEEK MEDICAL CARE IF:  You are unsure if you are in labor or if your water has broken.  You have dizziness.  You have mild pelvic cramps, pelvic pressure, or nagging pain in your abdominal area.  You have persistent nausea, vomiting, or diarrhea.  You have a bad smelling vaginal discharge.  You have pain with urination. SEEK IMMEDIATE MEDICAL CARE IF:   You have a fever.  You are leaking fluid from your vagina.  You have spotting or bleeding from your vagina.  You have severe abdominal cramping or pain.  You have rapid weight loss or gain.  You have shortness of breath with chest pain.  You notice sudden or extreme swelling of your face, hands, ankles, feet, or legs.  You have not felt your baby move in over an hour.  You have severe headaches that do not go away with medicine.  You have vision changes.   This information is not intended to replace advice given to you by your health care provider. Make sure you discuss any questions you have with your health care provider.   Document Released: 12/18/2000 Document Revised: 01/14/2014 Document Reviewed: 02/25/2012 Elsevier Interactive Patient Education [employer]

## 2015-05-23 ENCOUNTER — Telehealth: Payer: Self-pay | Admitting: *Deleted

## 2015-05-23 DIAGNOSIS — O99013 Anemia complicating pregnancy, third trimester: Secondary | ICD-10-CM | POA: Insufficient documentation

## 2015-05-23 LAB — GLUCOSE TOLERANCE, 1 HOUR (50G) W/O FASTING: GLUCOSE, 1 HR, GESTATIONAL: 105 mg/dL (ref <140)

## 2015-05-23 LAB — RPR

## 2015-05-23 MED ORDER — DOCUSATE SODIUM 100 MG PO CAPS: 100.0000 mg | ORAL_CAPSULE | Freq: Two times a day (BID) | ORAL | Status: DC | PRN

## 2015-05-23 MED ORDER — FERROUS SULFATE 325 (65 FE) MG PO TABS: 325.0000 mg | ORAL_TABLET | Freq: Two times a day (BID) | ORAL | Status: DC

## 2015-05-23 NOTE — Telephone Encounter (Addendum)
Called pt and left message stating that I am calling with test result information. Please call back and state whether a detailed message can be left on her voice mail.  The office is closed for the next 2 days and we will likely call back on Friday with these non-emergent test results. Pt needs to be informed that her DM screen was negative however she is anemic and requires treatment. Rx for iron and stool softener have been sent to her pharmacy and she should take as directed. She will have repeat blood count checked in 4 weeks.   1535  Pt returned the call and was informed of all test result information and treatment plan as previously stated. She voiced understanding and greed to plan of care.

## 2015-06-19 ENCOUNTER — Ambulatory Visit (INDEPENDENT_AMBULATORY_CARE_PROVIDER_SITE_OTHER): Payer: BLUE CROSS/BLUE SHIELD | Admitting: Obstetrics & Gynecology

## 2015-06-19 VITALS — BP 112/58 | HR 86 | Wt 223.6 lb

## 2015-06-19 DIAGNOSIS — Z3493 Encounter for supervision of normal pregnancy, unspecified, third trimester: Secondary | ICD-10-CM

## 2015-06-19 LAB — POCT URINALYSIS DIP (DEVICE)
Bilirubin Urine: NEGATIVE
GLUCOSE, UA: NEGATIVE mg/dL
Hgb urine dipstick: NEGATIVE
KETONES UR: NEGATIVE mg/dL
Leukocytes, UA: NEGATIVE
Nitrite: NEGATIVE
Protein, ur: NEGATIVE mg/dL
SPECIFIC GRAVITY, URINE: 1.025 (ref 1.005–1.030)
Urobilinogen, UA: 1 mg/dL (ref 0.0–1.0)
pH: 6.5 (ref 5.0–8.0)

## 2015-06-19 NOTE — Progress Notes (Signed)
Subjective:  Darlene Hayes is a 30 y.o. G1P0000 at 1947w2d being seen today for ongoing prenatal care.  She is currently monitored for the following issues for this low-risk pregnancy and has Smoker; Obesity, unspecified; Supervision of low-risk pregnancy; Obesity affecting pregnancy, antepartum; Tobacco use affecting pregnancy, antepartum; Marijuana smoker (HCC); and Anemia affecting pregnancy in third trimester on her problem list.  Patient reports no complaints.  Contractions: Not present. Vag. Bleeding: None.  Movement: Present. Denies leaking of fluid.   The following portions of the patient's history were reviewed and updated as appropriate: allergies, current medications, past family history, past medical history, past social history, past surgical history and problem list. Problem list updated.  Objective:   Filed Vitals:   06/19/15 0835  BP: 112/58  Pulse: 86  Weight: 223 lb 9.6 oz (101.424 kg)    Fetal Status: Fetal Heart Rate (bpm): 143   Movement: Present     General:  Alert, oriented and cooperative. Patient is in no acute distress.  Skin: Skin is warm and dry. No rash noted.   Cardiovascular: Normal heart rate noted  Respiratory: Normal respiratory effort, no problems with respiration noted  Abdomen: Soft, gravid, appropriate for gestational age. Pain/Pressure: Present     Pelvic: Cervical exam deferred        Extremities: Normal range of motion.  Edema: Trace  Mental Status: Normal mood and affect. Normal behavior. Normal judgment and thought content.   Urinalysis: Urine Protein: Negative Urine Glucose: Negative  Assessment and Plan:  Pregnancy: G1P0000 at 1647w2d  1. Supervision of low-risk pregnancy, third trimester [place]  Preterm labor symptoms and general obstetric precautions including but not limited to vaginal bleeding, contractions, leaking of fluid and fetal movement were reviewed in detail with the patient. Please refer to After  Visit Summary for other counseling recommendations.  Return in about 2 weeks (around 07/03/2015). [facility]  Adam PhenixJames G Venice Liz, MD

## 2015-06-19 NOTE — Progress Notes (Signed)
Educated pt [place]  

## 2015-07-04 ENCOUNTER — Ambulatory Visit (INDEPENDENT_AMBULATORY_CARE_PROVIDER_SITE_OTHER): Payer: BLUE CROSS/BLUE SHIELD | Admitting: Obstetrics & Gynecology

## 2015-07-04 VITALS — BP 102/64 | HR 102 | Wt 222.0 lb

## 2015-07-04 DIAGNOSIS — Z3403 Encounter for supervision of normal first pregnancy, third trimester: Secondary | ICD-10-CM

## 2015-07-04 DIAGNOSIS — Z3493 Encounter for supervision of normal pregnancy, unspecified, third trimester: Secondary | ICD-10-CM

## 2015-07-04 NOTE — Progress Notes (Signed)
Subjective:  Darlene Hayes is a 30 y.o. G1P0000 at 6012w3d being seen today for ongoing prenatal care.  She is currently monitored for the following issues for this low-risk pregnancy and has Smoker; Obesity, unspecified; Supervision of low-risk pregnancy; Obesity affecting pregnancy, antepartum; Tobacco use affecting pregnancy, antepartum; Marijuana smoker (HCC); and Anemia affecting pregnancy in third trimester on her problem list.  Patient reports no complaints.  Contractions: Not present. Vag. Bleeding: None.  Movement: Absent. Denies leaking of fluid.   The following portions of the patient's history were reviewed and updated as appropriate: allergies, current medications, past family history, past medical history, past social history, past surgical history and problem list. Problem list updated.  Objective:   Filed Vitals:   07/04/15 0905  BP: 102/64  Pulse: 102  Weight: 222 lb (100.699 kg)    Fetal Status: Fetal Heart Rate (bpm): 156 Fundal Height: 34 cm Movement: Absent     General:  Alert, oriented and cooperative. Patient is in no acute distress.  Skin: Skin is warm and dry. No rash noted.   Cardiovascular: Normal heart rate noted  Respiratory: Normal respiratory effort, no problems with respiration noted  Abdomen: Soft, gravid, appropriate for gestational age. Pain/Pressure: Absent     Pelvic: Cervical exam deferred        Extremities: Normal range of motion.  Edema: Trace  Mental Status: Normal mood and affect. Normal behavior. Normal judgment and thought content.   Urinalysis: Urine Protein: Trace Urine Glucose: Negative  Assessment and Plan:  Pregnancy: G1P0000 at 212w3d  1. Supervision of low-risk pregnancy, third trimester No complaints. Declines TDap for now, will give information to review at home and ask at next visit. Preterm labor symptoms and general obstetric precautions including but not limited to vaginal bleeding, contractions, leaking of fluid and fetal  movement were reviewed in detail with the patient. Please refer to After Visit Summary for other counseling recommendations.  Return in about 2 weeks (around 07/18/2015) for OB Visit.   Tereso NewcomerUgonna A Fransisco Messmer, MD

## 2015-07-04 NOTE — Patient Instructions (Addendum)
Return to clinic for any scheduled appointments or obstetric concerns, or go to MAU for evaluation  Tdap Vaccine (Tetanus, Diphtheria and Pertussis): What You Need to Know 1. Why get vaccinated? Tetanus, diphtheria and pertussis are very serious diseases. Tdap vaccine can protect us from these diseases. And, Tdap vaccine given to pregnant women can protect newborn babies against pertussis. TETANUS (Lockjaw) is rare in the [place] today. It causes painful muscle tightening and stiffness, usually all over the body.  It can lead to tightening of muscles in the head and neck so you can't open your mouth, swallow, or sometimes even breathe. Tetanus kills about 1 out of 10 people who are infected even after receiving the best medical care. DIPHTHERIA is also rare in the [place] today. It can cause a thick coating to form in the back of the throat.  It can lead to breathing problems, heart failure, paralysis, and death. PERTUSSIS (Whooping Cough) causes severe coughing spells, which can cause difficulty breathing, vomiting and disturbed sleep.  It can also lead to weight loss, incontinence, and rib fractures. Up to 2 in 100 adolescents and 5 in 100 adults with pertussis are hospitalized or have complications, which could include pneumonia or death. These diseases are caused by bacteria. Diphtheria and pertussis are spread from person to person through secretions from coughing or sneezing. Tetanus enters the body through cuts, scratches, or wounds. Before vaccines, as many as 200,000 cases of diphtheria, 200,000 cases of pertussis, and hundreds of cases of tetanus, were reported in the [place] each year. Since vaccination began, reports of cases for tetanus and diphtheria have dropped by about 99% and for pertussis by about 80%. 2. Tdap vaccine Tdap vaccine can protect adolescents and adults from tetanus, diphtheria, and pertussis. One dose of Tdap is routinely given at age 11 or 12.  People who did not get Tdap at that age should get it as soon as possible. Tdap is especially important for healthcare professionals and anyone having close contact with a baby younger than 12 months. Pregnant women should get a dose of Tdap during every pregnancy, to protect the newborn from pertussis. Infants are most at risk for severe, life-threatening complications from pertussis. Another vaccine, called Td, protects against tetanus and diphtheria, but not pertussis. A Td booster should be given every 10 years. Tdap may be given as one of these boosters if you have never gotten Tdap before. Tdap may also be given after a severe cut or burn to prevent tetanus infection. Your doctor or the person giving you the vaccine can give you more information. Tdap may safely be given at the same time as other vaccines. 3. Some people should not get this vaccine  A person who has ever had a life-threatening allergic reaction after a previous dose of any diphtheria, tetanus or pertussis containing vaccine, OR has a severe allergy to any part of this vaccine, should not get Tdap vaccine. Tell the person giving the vaccine about any severe allergies.  Anyone who had coma or long repeated seizures within 7 days after a childhood dose of DTP or DTaP, or a previous dose of Tdap, should not get Tdap, unless a cause other than the vaccine was found. They can still get Td.  Talk to your doctor if you:  have seizures or another nervous system problem,  had severe pain or swelling after any vaccine containing diphtheria, tetanus or pertussis,  ever had a condition called Guillain-Barr Syndrome (GBS),  aren't feeling   well on the day the shot is scheduled. 4. Risks With any medicine, including vaccines, there is a chance of side effects. These are usually mild and go away on their own. Serious reactions are also possible but are rare. Most people who get Tdap vaccine do not have any problems with it. Mild  problems following Tdap (Did not interfere with activities)  Pain where the shot was given (about 3 in 4 adolescents or 2 in 3 adults)  Redness or swelling where the shot was given (about 1 person in 5)  Mild fever of at least 100.4F (up to about 1 in 25 adolescents or 1 in 100 adults)  Headache (about 3 or 4 people in 10)  Tiredness (about 1 person in 3 or 4)  Nausea, vomiting, diarrhea, stomach ache (up to 1 in 4 adolescents or 1 in 10 adults)  Chills, sore joints (about 1 person in 10)  Body aches (about 1 person in 3 or 4)  Rash, swollen glands (uncommon) Moderate problems following Tdap (Interfered with activities, but did not require medical attention)  Pain where the shot was given (up to 1 in 5 or 6)  Redness or swelling where the shot was given (up to about 1 in 16 adolescents or 1 in 12 adults)  Fever over 102F (about 1 in 100 adolescents or 1 in 250 adults)  Headache (about 1 in 7 adolescents or 1 in 10 adults)  Nausea, vomiting, diarrhea, stomach ache (up to 1 or 3 people in 100)  Swelling of the entire arm where the shot was given (up to about 1 in 500). Severe problems following Tdap (Unable to perform usual activities; required medical attention)  Swelling, severe pain, bleeding and redness in the arm where the shot was given (rare). Problems that could happen after any vaccine:  People sometimes faint after a medical procedure, including vaccination. Sitting or lying down for about 15 minutes can help prevent fainting, and injuries caused by a fall. Tell your doctor if you feel dizzy, or have vision changes or ringing in the ears.  Some people get severe pain in the shoulder and have difficulty moving the arm where a shot was given. This happens very rarely.  Any medication can cause a severe allergic reaction. Such reactions from a vaccine are very rare, estimated at fewer than 1 in a million doses, and would happen within a few minutes to a few hours  after the vaccination. As with any medicine, there is a very remote chance of a vaccine causing a serious injury or death. The safety of vaccines is always being monitored. For more information, visit: www.cdc.gov/vaccinesafety/ 5. What if there is a serious problem? What should I look for?  Look for anything that concerns you, such as signs of a severe allergic reaction, very high fever, or unusual behavior.  Signs of a severe allergic reaction can include hives, swelling of the face and throat, difficulty breathing, a fast heartbeat, dizziness, and weakness. These would usually start a few minutes to a few hours after the vaccination. What should I do?  If you think it is a severe allergic reaction or other emergency that can't wait, call 9-1-1 or get the person to the nearest hospital. Otherwise, call your doctor.  Afterward, the reaction should be reported to the Vaccine Adverse Event Reporting System (VAERS). Your doctor might file this report, or you can do it yourself through the VAERS web site at www.vaers.hhs.gov, or by calling 1-800-822-7967. VAERS does not   give medical advice.  6. The National Vaccine Injury Compensation Program The National Vaccine Injury Compensation Program (VICP) is a federal program that was created to compensate people who may have been injured by certain vaccines. Persons who believe they may have been injured by a vaccine can learn about the program and about filing a claim by calling 1-800-338-2382 or visiting the VICP website at www.hrsa.gov/vaccinecompensation. There is a time limit to file a claim for compensation. 7. How can I learn more?  Ask your doctor. He or she can give you the vaccine package insert or suggest other sources of information.  Call your local or state health department.  Contact the Centers for Disease Control and Prevention (CDC):  Call 1-800-232-4636 (1-800-CDC-INFO) or  Visit CDC's website at www.cdc.gov/vaccines CDC Tdap  Vaccine VIS (03/02/13)   This information is not intended to replace advice given to you by your health care provider. Make sure you discuss any questions you have with your health care provider.   Document Released: 06/25/2011 Document Revised: 01/14/2014 Document Reviewed: 04/07/2013 Elsevier Interactive Patient Education 2016 Elsevier Inc.  

## 2015-07-05 ENCOUNTER — Inpatient Hospital Stay (HOSPITAL_COMMUNITY)
Admission: AD | Admit: 2015-07-05 | Discharge: 2015-07-05 | Disposition: A | Discharge status: Home / Self Care | Payer: BLUE CROSS/BLUE SHIELD | Source: Ambulatory Visit | Attending: Obstetrics and Gynecology | Admitting: Obstetrics and Gynecology

## 2015-07-05 ENCOUNTER — Encounter (HOSPITAL_COMMUNITY): Payer: Self-pay | Admitting: *Deleted

## 2015-07-05 DIAGNOSIS — R42 Dizziness and giddiness: Secondary | ICD-10-CM | POA: Insufficient documentation

## 2015-07-05 DIAGNOSIS — Z3A32 32 weeks gestation of pregnancy: Secondary | ICD-10-CM | POA: Insufficient documentation

## 2015-07-05 DIAGNOSIS — F1290 Cannabis use, unspecified, uncomplicated: Secondary | ICD-10-CM | POA: Diagnosis not present

## 2015-07-05 DIAGNOSIS — E669 Obesity, unspecified: Secondary | ICD-10-CM | POA: Diagnosis not present

## 2015-07-05 DIAGNOSIS — O99323 Drug use complicating pregnancy, third trimester: Secondary | ICD-10-CM | POA: Insufficient documentation

## 2015-07-05 DIAGNOSIS — O26893 Other specified pregnancy related conditions, third trimester: Secondary | ICD-10-CM | POA: Diagnosis not present

## 2015-07-05 DIAGNOSIS — O99213 Obesity complicating pregnancy, third trimester: Secondary | ICD-10-CM | POA: Diagnosis not present

## 2015-07-05 DIAGNOSIS — Z87891 Personal history of nicotine dependence: Secondary | ICD-10-CM | POA: Insufficient documentation

## 2015-07-05 DIAGNOSIS — O99013 Anemia complicating pregnancy, third trimester: Secondary | ICD-10-CM | POA: Insufficient documentation

## 2015-07-05 DIAGNOSIS — D649 Anemia, unspecified: Secondary | ICD-10-CM | POA: Diagnosis not present

## 2015-07-05 DIAGNOSIS — R55 Syncope and collapse: Secondary | ICD-10-CM

## 2015-07-05 LAB — CBC WITH DIFFERENTIAL/PLATELET
BASOS PCT: 0 %
Basophils Absolute: 0 10*3/uL (ref 0.0–0.1)
EOS ABS: 0 10*3/uL (ref 0.0–0.7)
EOS PCT: 1 %
HCT: 27.8 % — ABNORMAL LOW (ref 36.0–46.0)
HEMOGLOBIN: 8.7 g/dL — AB (ref 12.0–15.0)
LYMPHS ABS: 1.3 10*3/uL (ref 0.7–4.0)
Lymphocytes Relative: 15 %
MCH: 24.3 pg — AB (ref 26.0–34.0)
MCHC: 31.3 g/dL (ref 30.0–36.0)
MCV: 77.7 fL — ABNORMAL LOW (ref 78.0–100.0)
Monocytes Absolute: 0.4 10*3/uL (ref 0.1–1.0)
Monocytes Relative: 4 %
NEUTROS PCT: 80 %
Neutro Abs: 6.6 10*3/uL (ref 1.7–7.7)
PLATELETS: 197 10*3/uL (ref 150–400)
RBC: 3.58 MIL/uL — AB (ref 3.87–5.11)
RDW: 16.7 % — ABNORMAL HIGH (ref 11.5–15.5)
WBC: 8.3 10*3/uL (ref 4.0–10.5)

## 2015-07-05 LAB — URINALYSIS, ROUTINE W REFLEX MICROSCOPIC
Bilirubin Urine: NEGATIVE
Glucose, UA: NEGATIVE mg/dL
Hgb urine dipstick: NEGATIVE
Ketones, ur: NEGATIVE mg/dL
Leukocytes, UA: NEGATIVE
Nitrite: NEGATIVE
Protein, ur: NEGATIVE mg/dL
Specific Gravity, Urine: <1.005 — ABNORMAL LOW (ref 1.005–1.030)
pH: 6.5 (ref 5.0–8.0)

## 2015-07-05 NOTE — Discharge Instructions (Signed)
Pregnancy and Anemia Anemia is a condition in which the concentration of red blood cells or hemoglobin in the blood is below normal. Hemoglobin is a substance in red blood cells that carries oxygen to the tissues of the body. Anemia results in not enough oxygen reaching these tissues.  Anemia during pregnancy is common because the fetus uses more iron and folic acid as it is developing. Your body may not produce enough red blood cells because of this. Also, during pregnancy, the liquid part of the blood (plasma) increases by about 50%, and the red blood cells increase by only 25%. This lowers the concentration of the red blood cells and creates a natural anemia-like situation.  CAUSES  The most common cause of anemia during pregnancy is not having enough iron in the body to make red blood cells (iron deficiency anemia). Other causes may include:  Folic acid deficiency.  Vitamin B12 deficiency.  Certain prescription or over-the-counter medicines.  Certain medical conditions or infections that destroy red blood cells.  A low platelet count and bleeding caused by antibodies that go through the placenta to the fetus from the mother's blood. SIGNS AND SYMPTOMS  Mild anemia may not be noticeable. If it becomes severe, symptoms may include:  Tiredness.  Shortness of breath, especially with exercise.  Weakness.  Fainting.  Pale looking skin.  Headaches.  Feeling a fast or irregular heartbeat (palpitations). DIAGNOSIS  The type of anemia is usually diagnosed from your family and medical history and blood tests. TREATMENT  Treatment of anemia during pregnancy depends on the cause of the anemia. Treatment can include:  Supplements of iron, vitamin B12, or folic acid.  A blood transfusion. This may be needed if blood loss is severe.  Hospitalization. This may be needed if there is significant continual blood loss.  Dietary changes. HOME CARE INSTRUCTIONS   Follow your dietitian's or  health care provider's dietary recommendations.  Increase your vitamin C intake. This will help the stomach absorb more iron.  Eat a diet rich in iron. This would include foods such as:  Liver.  Beef.  Whole grain bread.  Eggs.  Dried fruit.  Take iron and vitamins as directed by your health care provider.  Eat green leafy vegetables. These are a good source of folic acid. SEEK MEDICAL CARE IF:   You have frequent or lasting headaches.  You are looking pale.  You are bruising easily. SEEK IMMEDIATE MEDICAL CARE IF:   You have extreme weakness, shortness of breath, or chest pain.  You become dizzy or have trouble concentrating.  You have heavy vaginal bleeding.  You develop a rash.  You have bloody or black, tarry stools.  You faint.  You vomit up blood.  You vomit repeatedly.  You have abdominal pain.  You have a fever or persistent symptoms for more than 2-3 days.  You have a fever and your symptoms suddenly get worse.  You are dehydrated. MAKE SURE YOU:   Understand these instructions.  Will watch your condition.  Will get help right away if you are not doing well or get worse.   This information is not intended to replace advice given to you by your health care provider. Make sure you discuss any questions you have with your health care provider.   Document Released: 12/22/1999 Document Revised: 10/14/2012 Document Reviewed: 08/05/2012 Elsevier Interactive Patient Education [employer]2016 [employer] Inc.  Third Trimester of Pregnancy The third trimester is from week 29 through week 42, months 7 through 9.  The third trimester is a time when the fetus is growing rapidly. At the end of the ninth month, the fetus is about 20 inches in length and weighs 6-10 pounds.  BODY CHANGES Your body goes through many changes during pregnancy. The changes vary from woman to woman.   Your weight will continue to increase. You can expect to gain 25-35 pounds (11-16 kg) by  the end of the pregnancy.  You may begin to get stretch marks on your hips, abdomen, and breasts.  You may urinate more often because the fetus is moving lower into your pelvis and pressing on your bladder.  You may develop or continue to have heartburn as a result of your pregnancy.  You may develop constipation because certain hormones are causing the muscles that push waste through your intestines to slow down.  You may develop hemorrhoids or swollen, bulging veins (varicose veins).  You may have pelvic pain because of the weight gain and pregnancy hormones relaxing your joints between the bones in your pelvis. Backaches may result from overexertion of the muscles supporting your posture.  You may have changes in your hair. These can include thickening of your hair, rapid growth, and changes in texture. Some women also have hair loss during or after pregnancy, or hair that feels dry or thin. Your hair will most likely return to normal after your baby is born.  Your breasts will continue to grow and be tender. A yellow discharge may leak from your breasts called colostrum.  Your belly button may stick out.  You may feel short of breath because of your expanding uterus.  You may notice the fetus "dropping," or moving lower in your abdomen.  You may have a bloody mucus discharge. This usually occurs a few days to a week before labor begins.  Your cervix becomes thin and soft (effaced) near your due date. WHAT TO EXPECT AT YOUR PRENATAL EXAMS  You will have prenatal exams every 2 weeks until week 36. Then, you will have weekly prenatal exams. During a routine prenatal visit:  You will be weighed to make sure you and the fetus are growing normally.  Your blood pressure is taken.  Your abdomen will be measured to track your baby's growth.  The fetal heartbeat will be listened to.  Any test results from the previous visit will be discussed.  You may have a cervical check near your  due date to see if you have effaced. At around 36 weeks, your caregiver will check your cervix. At the same time, your caregiver will also perform a test on the secretions of the vaginal tissue. This test is to determine if a type of bacteria, Group B streptococcus, is present. Your caregiver will explain this further. Your caregiver may ask you:  What your birth plan is.  How you are feeling.  If you are feeling the baby move.  If you have had any abnormal symptoms, such as leaking fluid, bleeding, severe headaches, or abdominal cramping.  If you are using any tobacco products, including cigarettes, chewing tobacco, and electronic cigarettes.  If you have any questions. Other tests or screenings that may be performed during your third trimester include:  Blood tests that check for low iron levels (anemia).  Fetal testing to check the health, activity level, and growth of the fetus. Testing is done if you have certain medical conditions or if there are problems during the pregnancy.  HIV (human immunodeficiency virus) testing. If you are at high  risk, you may be screened for HIV during your third trimester of pregnancy. FALSE LABOR You may feel small, irregular contractions that eventually go away. These are called Braxton Hicks contractions, or false labor. Contractions may last for hours, days, or even weeks before true labor sets in. If contractions come at regular intervals, intensify, or become painful, it is best to be seen by your caregiver.  SIGNS OF LABOR   Menstrual-like cramps.  Contractions that are 5 minutes apart or less.  Contractions that start on the top of the uterus and spread down to the lower abdomen and back.  A sense of increased pelvic pressure or back pain.  A watery or bloody mucus discharge that comes from the vagina. If you have any of these signs before the 37th week of pregnancy, call your caregiver right away. You need to go to the hospital to get  checked immediately. HOME CARE INSTRUCTIONS   Avoid all smoking, herbs, alcohol, and unprescribed drugs. These chemicals affect the formation and growth of the baby.  Do not use any tobacco products, including cigarettes, chewing tobacco, and electronic cigarettes. If you need help quitting, ask your health care provider. You may receive counseling support and other resources to help you quit.  Follow your caregiver's instructions regarding medicine use. There are medicines that are either safe or unsafe to take during pregnancy.  Exercise only as directed by your caregiver. Experiencing uterine cramps is a good sign to stop exercising.  Continue to eat regular, healthy meals.  Wear a good support bra for breast tenderness.  Do not use hot tubs, steam rooms, or saunas.  Wear your seat belt at all times when driving.  Avoid raw meat, uncooked cheese, cat litter boxes, and soil used by cats. These carry germs that can cause birth defects in the baby.  Take your prenatal vitamins.  Take 1500-2000 mg of calcium daily starting at the 20th week of pregnancy until you deliver your baby.  Try taking a stool softener (if your caregiver approves) if you develop constipation. Eat more high-fiber foods, such as fresh vegetables or fruit and whole grains. Drink plenty of fluids to keep your urine clear or pale yellow.  Take warm sitz baths to soothe any pain or discomfort caused by hemorrhoids. Use hemorrhoid cream if your caregiver approves.  If you develop varicose veins, wear support hose. Elevate your feet for 15 minutes, 3-4 times a day. Limit salt in your diet.  Avoid heavy lifting, wear low heal shoes, and practice good posture.  Rest a lot with your legs elevated if you have leg cramps or low back pain.  Visit your dentist if you have not gone during your pregnancy. Use a soft toothbrush to brush your teeth and be gentle when you floss.  A sexual relationship may be continued unless  your caregiver directs you otherwise.  Do not travel far distances unless it is absolutely necessary and only with the approval of your caregiver.  Take prenatal classes to understand, practice, and ask questions about the labor and delivery.  Make a trial run to the hospital.  Pack your hospital bag.  Prepare the baby's nursery.  Continue to go to all your prenatal visits as directed by your caregiver. SEEK MEDICAL CARE IF:  You are unsure if you are in labor or if your water has broken.  You have dizziness.  You have mild pelvic cramps, pelvic pressure, or nagging pain in your abdominal area.  You have persistent nausea,  vomiting, or diarrhea.  You have a bad smelling vaginal discharge.  You have pain with urination. SEEK IMMEDIATE MEDICAL CARE IF:   You have a fever.  You are leaking fluid from your vagina.  You have spotting or bleeding from your vagina.  You have severe abdominal cramping or pain.  You have rapid weight loss or gain.  You have shortness of breath with chest pain.  You notice sudden or extreme swelling of your face, hands, ankles, feet, or legs.  You have not felt your baby move in over an hour.  You have severe headaches that do not go away with medicine.  You have vision changes.   This information is not intended to replace advice given to you by your health care provider. Make sure you discuss any questions you have with your health care provider.   Document Released: 12/18/2000 Document Revised: 01/14/2014 Document Reviewed: 02/25/2012 Elsevier Interactive Patient Education [employer]

## 2015-07-05 NOTE — MAU Note (Signed)
Pt states that she became dizzy while at work when going to the restroom. Pt states that she did not pass out. Employee called EMS. Pt arrived to MAU by stretcher and placed on EFM. FHR 140. Abdomen soft on palpation. Pt denies vaginal bleeding, leaking fluid or contractions. Provider to be notified.

## 2015-07-05 NOTE — MAU Provider Note (Signed)
MAU HISTORY AND PHYSICAL  Chief Complaint:  No chief complaint on file.   Darlene Hayes is a 30 y.o.  G1P0000 with IUP at 2171w4d presenting for No chief complaint on file. Prenatal risk factors: Smoker (1/4 pack since 30 yo, remission since pregnancy), Marijuana use during pregnancy, Obesity, nullipara, anemia 3rd trimester Pt states that today at around 7AM was at her job, which requires her to stand, had a sudden onset of dizziness.  Episode was accompanied by flashes of light, nausea, ringing in her ears.  Pt took her BP and found to be 140/?.  Never had LOC, abnormal movement, urinary/bowel incontinence, CP, SOB, RUQ pain, swelling, HA.  Episode lasted ~minutes and self limited.  Pt brought to MAU by EMS for evaluation.  This is the first episode pt has had.  Patient states she has been having  none contractions, none vaginal bleeding, intact membranes, with active fetal movement.    Past Medical History  Diagnosis Date  . Abnormal Pap smear     abn paps since 2000. no additional testing done.   . Gonorrhea     treated in July 2012. hasn't been with that partner since.   . Chlamydia 07/2010    treated. hasn't been with that partner since.     Past Surgical History  Procedure Laterality Date  . No past surgeries      Family History  Problem Relation Age of Onset  . Hypertension Mother   . Miscarriages / Stillbirths Sister   . Hypertension Sister   . Stroke Maternal Grandmother   . Diabetes Maternal Grandmother     Social History  Substance Use Topics  . Smoking status: Former Smoker -- 0.15 packs/day    Types: Cigarettes  . Smokeless tobacco: Never Used  . Alcohol Use: No    No Known Allergies  Prescriptions prior to admission  Medication Sig Dispense Refill Last Dose  . Prenatal Vit-Fe Fumarate-FA (PRENATAL VITAMINS PLUS) 27-1 MG TABS Take 1 tablet by mouth daily. 30 tablet 11 07/04/2015 at Unknown time  . ferrous sulfate 325 (65 FE) MG tablet Take 1 tablet (325  mg total) by mouth 2 (two) times daily with a meal. (Patient not taking: Reported on 07/05/2015) 60 tablet 3 Taking    Review of Systems - Negative except for what is mentioned in HPI.  Physical Exam  Blood pressure 112/70, pulse 99, temperature 97.9 F (36.6 C), temperature source Oral, resp. rate 16, last menstrual period 11/19/2014. GENERAL: Well-developed, well-nourished female in no acute distress.  LUNGS: Clear to auscultation bilaterally.  HEART: Regular rate and rhythm. ABDOMEN: Soft, nontender, nondistended, gravid.  EXTREMITIES: Nontender, no edema, 2+ distal pulses. Cervical Exam: Deferred Presentation: cephalic FHT:  Baseline 125, mod variability, +acels, no decels- reactive Contractions: none   Labs: No results found for this or any previous visit (from the past 24 hour(s)).  Imaging Studies:  No results found.  Assessment: Darlene Hayes is  30 y.o. G1P0000 at 6071w4d presents with dizziness.  Plan: #Dizziness -DDx: pre-syncope, BPPV, dehydration, anemia, vasovagal -CBC stable from prior, Hgb 8.7 -Discussed taking PNC and iron supplementation as prescribed, drink plenty of fluids -Discussed labor precautions and f/u with Ob provider  Olena LeatherwoodKelly M Aguilar 6/28/201710:17 AM  OB fellow attestation: I have seen and examined this patient; I agree with above documentation in the resident's note.   Darlene Hayes is a 30 y.o. G1P0000 reporting dizziness +FM, denies LOF, VB, contractions, vaginal discharge.  PE: BP 119/66 mmHg  Pulse 81  Temp(Src) 98.2 F (36.8 C) (Oral)  Resp 16  SpO2 100%  LMP 11/19/2014 (Exact Date) Gen: calm comfortable, NAD Resp: normal effort, no distress Abd: gravid  ROS, labs, PMH reviewed NST reactive   Plan: - fetal kick counts reinforced, preterm labor precautions - Discusses importance of taking Fe supplement and slow transitions  - continue routine follow up in OB clinic  Federico FlakeKimberly Niles Nikola Marone, MD , MPH, ABFM Family  Medicine, OB Fellow [facility]

## 2015-07-20 ENCOUNTER — Encounter: Payer: Self-pay | Admitting: Obstetrics & Gynecology

## 2015-07-20 ENCOUNTER — Ambulatory Visit (INDEPENDENT_AMBULATORY_CARE_PROVIDER_SITE_OTHER): Payer: BLUE CROSS/BLUE SHIELD | Admitting: Obstetrics & Gynecology

## 2015-07-20 VITALS — BP 103/54 | HR 89 | Wt 229.0 lb

## 2015-07-20 DIAGNOSIS — Z3493 Encounter for supervision of normal pregnancy, unspecified, third trimester: Secondary | ICD-10-CM

## 2015-07-20 DIAGNOSIS — O99013 Anemia complicating pregnancy, third trimester: Secondary | ICD-10-CM

## 2015-07-20 DIAGNOSIS — E669 Obesity, unspecified: Secondary | ICD-10-CM

## 2015-07-20 DIAGNOSIS — D649 Anemia, unspecified: Secondary | ICD-10-CM

## 2015-07-20 DIAGNOSIS — Z3403 Encounter for supervision of normal first pregnancy, third trimester: Secondary | ICD-10-CM

## 2015-07-20 DIAGNOSIS — O99213 Obesity complicating pregnancy, third trimester: Secondary | ICD-10-CM

## 2015-07-20 MED ORDER — FLUCONAZOLE 150 MG PO TABS: 150.0000 mg | ORAL_TABLET | Freq: Once | ORAL | Status: DC

## 2015-07-20 NOTE — Progress Notes (Signed)
Subjective:  Darlene Hayes is a 30 y.o. G1P0000 at 2566w5d being seen today for ongoing prenatal care.  She is currently monitored for the following issues for this low-risk pregnancy and has Smoker; Obesity, unspecified; Supervision of low-risk pregnancy; Obesity affecting pregnancy, antepartum; Tobacco use affecting pregnancy, antepartum; Marijuana smoker (HCC); and Anemia affecting pregnancy in third trimester on her problem list.  Patient reports the itching in her groin is not better after using Nystatin. .   .  .   . Denies leaking of fluid.   The following portions of the patient's history were reviewed and updated as appropriate: allergies, current medications, past family history, past medical history, past social history, past surgical history and problem list. Problem list updated.  Objective:  There were no vitals filed for this visit.  Fetal Status:           General:  Alert, oriented and cooperative. Patient is in no acute distress.  Skin: Skin is warm and dry. No rash noted.   Cardiovascular: Normal heart rate noted  Respiratory: Normal respiratory effort, no problems with respiration noted  Abdomen: Soft, gravid, appropriate for gestational age.       Pelvic:  Cervical exam deferred        Extremities: Normal range of motion.     Mental Status: Normal mood and affect. Normal behavior. Normal judgment and thought content.  Her groins show evidence of yeast infection. Scratch marks noted. Urinalysis:      Assessment and Plan:  Pregnancy: G1P0000 at 2266w5d  1. Supervision of low-risk pregnancy, third trimester   2. Obesity affecting pregnancy, antepartum, third trimester   3. Anemia affecting pregnancy in third trimester Treat with diflucan  Preterm labor symptoms and general obstetric precautions including but not limited to vaginal bleeding, contractions, leaking of fluid and fetal movement were reviewed in detail with the patient. Please refer to After Visit  Summary for other counseling recommendations.  No Follow-up on file.   Darlene BossierMyra C Jadea Shiffer, MD

## 2015-07-20 NOTE — Progress Notes (Signed)
Still having vaginal itching,  Tried Nystatin powder without relief.

## 2015-07-28 ENCOUNTER — Encounter: Payer: BLUE CROSS/BLUE SHIELD | Admitting: Obstetrics and Gynecology

## 2015-07-31 ENCOUNTER — Ambulatory Visit (INDEPENDENT_AMBULATORY_CARE_PROVIDER_SITE_OTHER): Payer: BLUE CROSS/BLUE SHIELD | Admitting: Obstetrics & Gynecology

## 2015-07-31 VITALS — BP 108/67 | HR 98 | Wt 232.0 lb

## 2015-07-31 DIAGNOSIS — Z113 Encounter for screening for infections with a predominantly sexual mode of transmission: Secondary | ICD-10-CM

## 2015-07-31 DIAGNOSIS — F17290 Nicotine dependence, other tobacco product, uncomplicated: Secondary | ICD-10-CM

## 2015-07-31 DIAGNOSIS — Z36 Encounter for antenatal screening of mother: Secondary | ICD-10-CM | POA: Diagnosis not present

## 2015-07-31 DIAGNOSIS — O99330 Smoking (tobacco) complicating pregnancy, unspecified trimester: Secondary | ICD-10-CM

## 2015-07-31 DIAGNOSIS — O99213 Obesity complicating pregnancy, third trimester: Secondary | ICD-10-CM

## 2015-07-31 DIAGNOSIS — Z3493 Encounter for supervision of normal pregnancy, unspecified, third trimester: Secondary | ICD-10-CM

## 2015-07-31 DIAGNOSIS — Z3483 Encounter for supervision of other normal pregnancy, third trimester: Secondary | ICD-10-CM

## 2015-07-31 DIAGNOSIS — E669 Obesity, unspecified: Secondary | ICD-10-CM

## 2015-07-31 LAB — OB RESULTS CONSOLE GBS: STREP GROUP B AG: NEGATIVE
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
## 2015-07-31 NOTE — Progress Notes (Signed)
Subjective:  Darlene Hayes is a 30 y.o. G1P0000 at [redacted]w[redacted]d being seen today for ongoing prenatal care.  She is currently monitored for the following issues for this low-risk pregnancy and has Smoker; Obesity, unspecified; Supervision of low-risk pregnancy; Obesity affecting pregnancy, antepartum; Tobacco use affecting pregnancy, antepartum; Marijuana smoker (HCC); and Anemia affecting pregnancy in third trimester on her problem list.  Patient reports no complaints.  Contractions: Irregular. Vag. Bleeding: None.  Movement: Present. Denies leaking of fluid.   The following portions of the patient's history were reviewed and updated as appropriate: allergies, current medications, past family history, past medical history, past social history, past surgical history and problem list. Problem list updated.  Objective:   Vitals:   07/31/15 1333  BP: 108/67  Pulse: 98  Weight: 232 lb (105.2 kg)    Fetal Status: Fetal Heart Rate (bpm): 158   Movement: Present     General:  Alert, oriented and cooperative. Patient is in no acute distress.  Skin: Skin is warm and dry. No rash noted.   Cardiovascular: Normal heart rate noted  Respiratory: Normal respiratory effort, no problems with respiration noted  Abdomen: Soft, gravid, appropriate for gestational age. Pain/Pressure: Present     Pelvic:  Cervical exam performed        Extremities: Normal range of motion.  Edema: Trace  Mental Status: Normal mood and affect. Normal behavior. Normal judgment and thought content.   Urinalysis: Urine Protein: Negative Urine Glucose: Negative  Assessment and Plan:  Pregnancy: G1P0000 at [redacted]w[redacted]d  1. Supervision of low-risk pregnancy, third trimester  - GC/Chlamydia probe amp ([place])not at [facility] - Culture, beta strep (group b only)  2. Obesity affecting pregnancy, antepartum, third trimester   3. Tobacco use affecting pregnancy, antepartum   Preterm labor symptoms and general obstetric precautions  including but not limited to vaginal bleeding, contractions, leaking of fluid and fetal movement were reviewed in detail with the patient. Please refer to After Visit Summary for other counseling recommendations.  No Follow-up on file.   Allie Bossier, MD

## 2015-08-01 LAB — GC/CHLAMYDIA PROBE AMP (~~LOC~~) NOT AT ARMC
CHLAMYDIA, DNA PROBE: NEGATIVE
NEISSERIA GONORRHEA: NEGATIVE

## 2015-08-02 LAB — CULTURE, BETA STREP (GROUP B ONLY)

## 2015-08-04 ENCOUNTER — Encounter (INDEPENDENT_AMBULATORY_CARE_PROVIDER_SITE_OTHER): Payer: Self-pay | Admitting: *Deleted

## 2015-08-04 ENCOUNTER — Encounter: Payer: Self-pay | Admitting: *Deleted

## 2015-08-04 DIAGNOSIS — Z3483 Encounter for supervision of other normal pregnancy, third trimester: Secondary | ICD-10-CM

## 2015-08-07 ENCOUNTER — Encounter (HOSPITAL_COMMUNITY): Payer: Self-pay | Admitting: *Deleted

## 2015-08-07 ENCOUNTER — Inpatient Hospital Stay (HOSPITAL_COMMUNITY)
Admission: AD | Admit: 2015-08-07 | Discharge: 2015-08-07 | Disposition: A | Discharge status: Home / Self Care | Payer: BLUE CROSS/BLUE SHIELD | Source: Ambulatory Visit | Attending: Obstetrics and Gynecology | Admitting: Obstetrics and Gynecology

## 2015-08-07 DIAGNOSIS — F1290 Cannabis use, unspecified, uncomplicated: Secondary | ICD-10-CM

## 2015-08-07 DIAGNOSIS — O99330 Smoking (tobacco) complicating pregnancy, unspecified trimester: Secondary | ICD-10-CM

## 2015-08-07 DIAGNOSIS — O99213 Obesity complicating pregnancy, third trimester: Secondary | ICD-10-CM

## 2015-08-07 DIAGNOSIS — O99013 Anemia complicating pregnancy, third trimester: Secondary | ICD-10-CM

## 2015-08-07 DIAGNOSIS — Z3493 Encounter for supervision of normal pregnancy, unspecified, third trimester: Secondary | ICD-10-CM | POA: Diagnosis not present

## 2015-08-07 LAB — URINALYSIS, ROUTINE W REFLEX MICROSCOPIC
Bilirubin Urine: NEGATIVE
Glucose, UA: NEGATIVE mg/dL
KETONES UR: NEGATIVE mg/dL
LEUKOCYTES UA: NEGATIVE
NITRITE: NEGATIVE
PROTEIN: NEGATIVE mg/dL
Specific Gravity, Urine: 1.015 (ref 1.005–1.030)
pH: 6.5 (ref 5.0–8.0)

## 2015-08-07 LAB — URINE MICROSCOPIC-ADD ON

## 2015-08-07 NOTE — MAU Note (Addendum)
Has been feeling pressure and feeling nauseous.  Also complains of headache 8/10, has not taken anything for it.

## 2015-08-10 ENCOUNTER — Ambulatory Visit (INDEPENDENT_AMBULATORY_CARE_PROVIDER_SITE_OTHER): Payer: BLUE CROSS/BLUE SHIELD | Admitting: Advanced Practice Midwife

## 2015-08-10 VITALS — BP 107/69 | HR 97 | Wt 229.0 lb

## 2015-08-10 DIAGNOSIS — Z3403 Encounter for supervision of normal first pregnancy, third trimester: Secondary | ICD-10-CM

## 2015-08-10 DIAGNOSIS — Z3493 Encounter for supervision of normal pregnancy, unspecified, third trimester: Secondary | ICD-10-CM

## 2015-08-10 NOTE — Progress Notes (Signed)
Subjective:  Darlene Hayes is a 30 y.o. G1P0000 at [redacted]w[redacted]d being seen today for ongoing prenatal care.  She is currently monitored for the following issues for this low-risk pregnancy and has Smoker; Obesity, unspecified; Supervision of low-risk pregnancy; Obesity affecting pregnancy, antepartum; Tobacco use affecting pregnancy, antepartum; Marijuana smoker (HCC); and Anemia affecting pregnancy in third trimester on her problem list.  Patient reports occasional contractions.  Contractions: Irregular. Vag. Bleeding: Scant.  Movement: Present. Denies leaking of fluid.   The following portions of the patient's history were reviewed and updated as appropriate: allergies, current medications, past family history, past medical history, past social history, past surgical history and problem list. Problem list updated.  Objective:   Vitals:   08/10/15 1553  BP: 107/69  Pulse: 97  Weight: 229 lb (103.9 kg)    Fetal Status: Fetal Heart Rate (bpm): 157 Fundal Height: 39 cm Movement: Present  Presentation: Vertex  General:  Alert, oriented and cooperative. Patient is in no acute distress.  Skin: Skin is warm and dry. No rash noted.   Cardiovascular: Normal heart rate noted  Respiratory: Normal respiratory effort, no problems with respiration noted  Abdomen: Soft, gravid, appropriate for gestational age. Pain/Pressure: Absent     Pelvic:  Cervical exam performed Dilation: 1.5 Effacement (%): 50 Station: -3  Extremities: Normal range of motion.  Edema: Mild pitting, slight indentation  Mental Status: Normal mood and affect. Normal behavior. Normal judgment and thought content.   Urinalysis:      Assessment and Plan:  Pregnancy: G1P0000 at [redacted]w[redacted]d  1. Supervision of low-risk pregnancy, third trimester  Term labor symptoms and general obstetric precautions including but not limited to vaginal bleeding, contractions, leaking of fluid and fetal movement were reviewed in detail with the  patient. Please refer to After Visit Summary for other counseling recommendations.  Return in about 1 week (around 08/17/2015).   Hurshel Party, CNM

## 2015-08-16 ENCOUNTER — Encounter (HOSPITAL_COMMUNITY): Payer: Self-pay

## 2015-08-16 ENCOUNTER — Inpatient Hospital Stay (HOSPITAL_COMMUNITY)
Admission: AD | Admit: 2015-08-16 | Discharge: 2015-08-17 | Disposition: A | Discharge status: Home / Self Care | Payer: BLUE CROSS/BLUE SHIELD | Source: Ambulatory Visit | Attending: Obstetrics and Gynecology | Admitting: Obstetrics and Gynecology

## 2015-08-16 DIAGNOSIS — O26893 Other specified pregnancy related conditions, third trimester: Secondary | ICD-10-CM | POA: Insufficient documentation

## 2015-08-16 DIAGNOSIS — Z3A38 38 weeks gestation of pregnancy: Secondary | ICD-10-CM | POA: Insufficient documentation

## 2015-08-16 DIAGNOSIS — Z87891 Personal history of nicotine dependence: Secondary | ICD-10-CM | POA: Insufficient documentation

## 2015-08-16 DIAGNOSIS — Z79899 Other long term (current) drug therapy: Secondary | ICD-10-CM | POA: Insufficient documentation

## 2015-08-16 DIAGNOSIS — N898 Other specified noninflammatory disorders of vagina: Secondary | ICD-10-CM

## 2015-08-16 LAB — POCT FERN TEST: POCT Fern Test: NEGATIVE

## 2015-08-16 NOTE — MAU Note (Signed)
Pt stated she had some leaking at home and heard some "popping noises from her stomach.

## 2015-08-17 ENCOUNTER — Ambulatory Visit (INDEPENDENT_AMBULATORY_CARE_PROVIDER_SITE_OTHER): Payer: BLUE CROSS/BLUE SHIELD | Admitting: Advanced Practice Midwife

## 2015-08-17 VITALS — BP 107/69 | HR 91 | Wt 236.0 lb

## 2015-08-17 DIAGNOSIS — N898 Other specified noninflammatory disorders of vagina: Secondary | ICD-10-CM | POA: Diagnosis not present

## 2015-08-17 DIAGNOSIS — Z87891 Personal history of nicotine dependence: Secondary | ICD-10-CM | POA: Diagnosis not present

## 2015-08-17 DIAGNOSIS — Z79899 Other long term (current) drug therapy: Secondary | ICD-10-CM | POA: Diagnosis not present

## 2015-08-17 DIAGNOSIS — Z3493 Encounter for supervision of normal pregnancy, unspecified, third trimester: Secondary | ICD-10-CM

## 2015-08-17 DIAGNOSIS — Z3A38 38 weeks gestation of pregnancy: Secondary | ICD-10-CM | POA: Diagnosis not present

## 2015-08-17 DIAGNOSIS — O26893 Other specified pregnancy related conditions, third trimester: Secondary | ICD-10-CM | POA: Diagnosis not present

## 2015-08-17 LAB — POCT FERN TEST: POCT Fern Test: NEGATIVE

## 2015-08-17 NOTE — Progress Notes (Signed)
Subjective:  Darlene Hayes is a 30 y.o. G1P0000 at 6647w5d being seen today for ongoing prenatal care.  She is currently monitored for the following issues for this low-risk pregnancy and has Smoker; Obesity, unspecified; Supervision of low-risk pregnancy; Obesity affecting pregnancy, antepartum; Tobacco use affecting pregnancy, antepartum; Marijuana smoker (HCC); and Anemia affecting pregnancy in third trimester on her problem list.  Patient reports no bleeding and occasional contractions.  Contractions: Not present. Vag. Bleeding: None.  Movement: Present. Denies leaking of fluid.   The following portions of the patient's history were reviewed and updated as appropriate: allergies, current medications, past family history, past medical history, past social history, past surgical history and problem list. Problem list updated.  Objective:   Vitals:   08/17/15 1457  BP: 107/69  Pulse: 91  Weight: 236 lb (107 kg)    Fetal Status: Fetal Heart Rate (bpm): 137   Movement: Present     General:  Alert, oriented and cooperative. Patient is in no acute distress.  Skin: Skin is warm and dry. No rash noted.   Cardiovascular: Normal heart rate noted  Respiratory: Normal respiratory effort, no problems with respiration noted  Abdomen: Soft, gravid, appropriate for gestational age. Pain/Pressure: Present     Pelvic:  Cervical exam deferred        Extremities: Normal range of motion.  Edema: Mild pitting, slight indentation  Mental Status: Normal mood and affect. Normal behavior. Normal judgment and thought content.   Urinalysis:      Assessment and Plan:  Pregnancy: G1P0000 at 7147w5d  There are no diagnoses linked to this encounter. Term labor symptoms and general obstetric precautions including but not limited to vaginal bleeding, contractions, leaking of fluid and fetal movement were reviewed in detail with the patient. Please refer to After Visit Summary for other counseling  recommendations.  Leave papers done Return in about 1 week (around 08/24/2015).   Dorathy KinsmanVirginia Trianna Lupien, CNM

## 2015-08-17 NOTE — MAU Provider Note (Signed)
  History     CSN: [id number]  Arrival date and time: 08/16/15 2218   None     Chief Complaint  Patient presents with  . Vaginal Discharge   HPI  Darlene Hayes is a 30yo G1 @ 38.5wks who presents for eval of possibly leaking fluid. No reg ctx; no bleeding. Her preg has been followed by the [facility] office and has been remarkable for 1) anemia 2) THC use 3) GBS neg  OB History    Gravida Para Term Preterm AB Living   1 0 0 0 0 0   SAB TAB Ectopic Multiple Live Births   0 0 0 0        Past Medical History:  Diagnosis Date  . Abnormal Pap smear    abn paps since 2000. no additional testing done.   . Chlamydia 07/2010   treated. hasn't been with that partner since.   . Gonorrhea    treated in July 2012. hasn't been with that partner since.     Past Surgical History:  Procedure Laterality Date  . NO PAST SURGERIES      Family History  Problem Relation Age of Onset  . Hypertension Mother   . Miscarriages / Stillbirths Sister   . Hypertension Sister   . Stroke Maternal Grandmother   . Diabetes Maternal Grandmother     Social History  Substance Use Topics  . Smoking status: Former Smoker    Packs/day: 0.15    Types: Cigarettes  . Smokeless tobacco: Never Used  . Alcohol use No    Allergies: No Known Allergies  Prescriptions Prior to Admission  Medication Sig Dispense Refill Last Dose  . docusate sodium (COLACE) 100 MG capsule TAKE 1 CAPSULE (100 MG TOTAL) BY MOUTH 2 (TWO) TIMES DAILY AS NEEDED. constipation  2 Not Taking  . ferrous sulfate 325 (65 FE) MG tablet Take 1 tablet (325 mg total) by mouth 2 (two) times daily with a meal. (Patient not taking: Reported on 08/10/2015) 60 tablet 3 Not Taking  . Prenatal Vit-Fe Fumarate-FA (PRENATAL VITAMINS PLUS) 27-1 MG TABS Take 1 tablet by mouth daily. 30 tablet 11 Taking    ROS No pertinent info in addition to HPI Physical Exam   Blood pressure 116/64, pulse 92, temperature 98.4 F (36.9 C), temperature source  Oral, resp. rate 18, height 5' 3.5" (1.613 m), weight 107.5 kg (237 lb), last menstrual period 11/19/2014.  Physical Exam  Constitutional: She is oriented to person, place, and time. She appears well-developed.  HENT:  Head: Normocephalic.  Neck: Normal range of motion.  Cardiovascular: Normal rate.   Respiratory: Effort normal.  GI:  EFM 130s, +accels, no decels Rare ctx  Genitourinary: Vagina normal.  Genitourinary Comments: SSE: neg pool, neg fern Cx deferred  Musculoskeletal: Normal range of motion.  Neurological: She is alert and oriented to person, place, and time.  Skin: Skin is warm and dry.  Psychiatric: She has a normal mood and affect. Her behavior is normal. Thought content normal.    MAU Course  Procedures  MDM NST read SSE  Assessment and Plan  IUP@38 .5wks Vag d/c in preg  Discharge home w/ labor/ROM/bleeding precautions F/U as scheduled tomorrow for next OB visit  Cam HaiSHAW, Syvanna Ciolino CNM 08/17/2015, 12:24 AM

## 2015-08-17 NOTE — Patient Instructions (Signed)
Braxton Hicks Contractions °Contractions of the uterus can occur throughout pregnancy. Contractions are not always a sign that you are in labor.  °WHAT ARE BRAXTON HICKS CONTRACTIONS?  °Contractions that occur before labor are called Braxton Hicks contractions, or false labor. Toward the end of pregnancy (32-34 weeks), these contractions can develop more often and may become more forceful. This is not true labor because these contractions do not result in opening (dilatation) and thinning of the cervix. They are sometimes difficult to tell apart from true labor because these contractions can be forceful and people have different pain tolerances. You should not feel embarrassed if you go to the hospital with false labor. Sometimes, the only way to tell if you are in true labor is for your health care provider to look for changes in the cervix. °If there are no prenatal problems or other health problems associated with the pregnancy, it is completely safe to be sent home with false labor and await the onset of true labor. °HOW CAN YOU TELL THE DIFFERENCE BETWEEN TRUE AND FALSE LABOR? °False Labor °· The contractions of false labor are usually shorter and not as hard as those of true labor.   °· The contractions are usually irregular.   °· The contractions are often felt in the front of the lower abdomen and in the groin.   °· The contractions may go away when you walk around or change positions while lying down.   °· The contractions get weaker and are shorter lasting as time goes on.   °· The contractions do not usually become progressively stronger, regular, and closer together as with true labor.   °True Labor °· Contractions in true labor last 30-70 seconds, become very regular, usually become more intense, and increase in frequency.   °· The contractions do not go away with walking.   °· The discomfort is usually felt in the top of the uterus and spreads to the lower abdomen and low back.   °· True labor can be  determined by your health care provider with an exam. This will show that the cervix is dilating and getting thinner.   °WHAT TO REMEMBER °· Keep up with your usual exercises and follow other instructions given by your health care provider.   °· Take medicines as directed by your health care provider.   °· Keep your regular prenatal appointments.   °· Eat and drink lightly if you think you are going into labor.   °· If Braxton Hicks contractions are making you uncomfortable:   °¨ Change your position from lying down or resting to walking, or from walking to resting.   °¨ Sit and rest in a tub of warm water.   °¨ Drink 2-3 glasses of water. Dehydration may cause these contractions.   °¨ Do slow and deep breathing several times an hour.   °WHEN SHOULD I SEEK IMMEDIATE MEDICAL CARE? °Seek immediate medical care if: °· Your contractions become stronger, more regular, and closer together.   °· You have fluid leaking or gushing from your vagina.   °· You have a fever.   °· You pass blood-tinged mucus.   °· You have vaginal bleeding.   °· You have continuous abdominal pain.   °· You have low back pain that you never had before.   °· You feel your baby's head pushing down and causing pelvic pressure.   °· Your baby is not moving as much as it used to.   °  °This information is not intended to replace advice given to you by your health care provider. Make sure you discuss any questions you have with your health care   provider. °  °Document Released: 12/24/2004 Document Revised: 12/29/2012 Document Reviewed: 10/05/2012 °Elsevier Interactive Patient Education ©2016 [employer] °Fetal Movement Counts °Patient Name: __________________________________________________ Patient Due Date: ____________________ °Performing a fetal movement count is highly recommended in high-risk pregnancies, but it is good for every pregnant woman to do. Your health care provider may ask you to start counting fetal movements at 28 weeks of the  pregnancy. Fetal movements often increase: °· After eating a full meal. °· After physical activity. °· After eating or drinking something sweet or cold. °· At rest. °Pay attention to when you feel the baby is most active. This will help you notice a pattern of your baby's sleep and wake cycles and what factors contribute to an increase in fetal movement. It is important to perform a fetal movement count at the same time each day when your baby is normally most active.  °HOW TO COUNT FETAL MOVEMENTS °1. Find a quiet and comfortable area to sit or lie down on your left side. Lying on your left side provides the best blood and oxygen circulation to your baby. °2. Write down the day and time on a sheet of paper or in a journal. °3. Start counting kicks, flutters, swishes, rolls, or jabs in a 2-hour period. You should feel at least 10 movements within 2 hours. °4. If you do not feel 10 movements in 2 hours, wait 2-3 hours and count again. Look for a change in the pattern or not enough counts in 2 hours. °SEEK MEDICAL CARE IF: °· You feel less than 10 counts in 2 hours, tried twice. °· There is no movement in over an hour. °· The pattern is changing or taking longer each day to reach 10 counts in 2 hours. °· You feel the baby is not moving as he or she usually does. °Date: ____________ Movements: ____________ Start time: ____________ Finish time: ____________  °Date: ____________ Movements: ____________ Start time: ____________ Finish time: ____________ °Date: ____________ Movements: ____________ Start time: ____________ Finish time: ____________ °Date: ____________ Movements: ____________ Start time: ____________ Finish time: ____________ °Date: ____________ Movements: ____________ Start time: ____________ Finish time: ____________ °Date: ____________ Movements: ____________ Start time: ____________ Finish time: ____________ °Date: ____________ Movements: ____________ Start time: ____________ Finish time:  ____________ °Date: ____________ Movements: ____________ Start time: ____________ Finish time: ____________  °Date: ____________ Movements: ____________ Start time: ____________ Finish time: ____________ °Date: ____________ Movements: ____________ Start time: ____________ Finish time: ____________ °Date: ____________ Movements: ____________ Start time: ____________ Finish time: ____________ °Date: ____________ Movements: ____________ Start time: ____________ Finish time: ____________ °Date: ____________ Movements: ____________ Start time: ____________ Finish time: ____________ °Date: ____________ Movements: ____________ Start time: ____________ Finish time: ____________ °Date: ____________ Movements: ____________ Start time: ____________ Finish time: ____________  °Date: ____________ Movements: ____________ Start time: ____________ Finish time: ____________ °Date: ____________ Movements: ____________ Start time: ____________ Finish time: ____________ °Date: ____________ Movements: ____________ Start time: ____________ Finish time: ____________ °Date: ____________ Movements: ____________ Start time: ____________ Finish time: ____________ °Date: ____________ Movements: ____________ Start time: ____________ Finish time: ____________ °Date: ____________ Movements: ____________ Start time: ____________ Finish time: ____________ °Date: ____________ Movements: ____________ Start time: ____________ Finish time: ____________  °Date: ____________ Movements: ____________ Start time: ____________ Finish time: ____________ °Date: ____________ Movements: ____________ Start time: ____________ Finish time: ____________ °Date: ____________ Movements: ____________ Start time: ____________ Finish time: ____________ °Date: ____________ Movements: ____________ Start time: ____________ Finish time: ____________ °Date: ____________ Movements: ____________ Start time: ____________ Finish time: ____________ °Date: ____________ Movements:  ____________ Start time: ____________ Finish time:   ____________ °Date: ____________ Movements: ____________ Start time: ____________ Finish time: ____________  °Date: ____________ Movements: ____________ Start time: ____________ Finish time: ____________ °Date: ____________ Movements: ____________ Start time: ____________ Finish time: ____________ °Date: ____________ Movements: ____________ Start time: ____________ Finish time: ____________ °Date: ____________ Movements: ____________ Start time: ____________ Finish time: ____________ °Date: ____________ Movements: ____________ Start time: ____________ Finish time: ____________ °Date: ____________ Movements: ____________ Start time: ____________ Finish time: ____________ °Date: ____________ Movements: ____________ Start time: ____________ Finish time: ____________  °Date: ____________ Movements: ____________ Start time: ____________ Finish time: ____________ °Date: ____________ Movements: ____________ Start time: ____________ Finish time: ____________ °Date: ____________ Movements: ____________ Start time: ____________ Finish time: ____________ °Date: ____________ Movements: ____________ Start time: ____________ Finish time: ____________ °Date: ____________ Movements: ____________ Start time: ____________ Finish time: ____________ °Date: ____________ Movements: ____________ Start time: ____________ Finish time: ____________ °Date: ____________ Movements: ____________ Start time: ____________ Finish time: ____________  °Date: ____________ Movements: ____________ Start time: ____________ Finish time: ____________ °Date: ____________ Movements: ____________ Start time: ____________ Finish time: ____________ °Date: ____________ Movements: ____________ Start time: ____________ Finish time: ____________ °Date: ____________ Movements: ____________ Start time: ____________ Finish time: ____________ °Date: ____________ Movements: ____________ Start time: ____________ Finish  time: ____________ °Date: ____________ Movements: ____________ Start time: ____________ Finish time: ____________ °Date: ____________ Movements: ____________ Start time: ____________ Finish time: ____________  °Date: ____________ Movements: ____________ Start time: ____________ Finish time: ____________ °Date: ____________ Movements: ____________ Start time: ____________ Finish time: ____________ °Date: ____________ Movements: ____________ Start time: ____________ Finish time: ____________ °Date: ____________ Movements: ____________ Start time: ____________ Finish time: ____________ °Date: ____________ Movements: ____________ Start time: ____________ Finish time: ____________ °Date: ____________ Movements: ____________ Start time: ____________ Finish time: ____________ °  °This information is not intended to replace advice given to you by your health care provider. Make sure you discuss any questions you have with your health care provider. °  °Document Released: 01/23/2006 Document Revised: 01/14/2014 Document Reviewed: 10/21/2011 °Elsevier Interactive Patient Education ©2016 [employer] ° °

## 2015-08-17 NOTE — Discharge Instructions (Signed)
Braxton Hicks Contractions °Contractions of the uterus can occur throughout pregnancy. Contractions are not always a sign that you are in labor.  °WHAT ARE BRAXTON HICKS CONTRACTIONS?  °Contractions that occur before labor are called Braxton Hicks contractions, or false labor. Toward the end of pregnancy (32-34 weeks), these contractions can develop more often and may become more forceful. This is not true labor because these contractions do not result in opening (dilatation) and thinning of the cervix. They are sometimes difficult to tell apart from true labor because these contractions can be forceful and people have different pain tolerances. You should not feel embarrassed if you go to the hospital with false labor. Sometimes, the only way to tell if you are in true labor is for your health care provider to look for changes in the cervix. °If there are no prenatal problems or other health problems associated with the pregnancy, it is completely safe to be sent home with false labor and await the onset of true labor. °HOW CAN YOU TELL THE DIFFERENCE BETWEEN TRUE AND FALSE LABOR? °False Labor °· The contractions of false labor are usually shorter and not as hard as those of true labor.   °· The contractions are usually irregular.   °· The contractions are often felt in the front of the lower abdomen and in the groin.   °· The contractions may go away when you walk around or change positions while lying down.   °· The contractions get weaker and are shorter lasting as time goes on.   °· The contractions do not usually become progressively stronger, regular, and closer together as with true labor.   °True Labor °· Contractions in true labor last 30-70 seconds, become very regular, usually become more intense, and increase in frequency.   °· The contractions do not go away with walking.   °· The discomfort is usually felt in the top of the uterus and spreads to the lower abdomen and low back.   °· True labor can be  determined by your health care provider with an exam. This will show that the cervix is dilating and getting thinner.   °WHAT TO REMEMBER °· Keep up with your usual exercises and follow other instructions given by your health care provider.   °· Take medicines as directed by your health care provider.   °· Keep your regular prenatal appointments.   °· Eat and drink lightly if you think you are going into labor.   °· If Braxton Hicks contractions are making you uncomfortable:   °¨ Change your position from lying down or resting to walking, or from walking to resting.   °¨ Sit and rest in a tub of warm water.   °¨ Drink 2-3 glasses of water. Dehydration may cause these contractions.   °¨ Do slow and deep breathing several times an hour.   °WHEN SHOULD I SEEK IMMEDIATE MEDICAL CARE? °Seek immediate medical care if: °· Your contractions become stronger, more regular, and closer together.   °· You have fluid leaking or gushing from your vagina.   °· You have a fever.   °· You pass blood-tinged mucus.   °· You have vaginal bleeding.   °· You have continuous abdominal pain.   °· You have low back pain that you never had before.   °· You feel your baby's head pushing down and causing pelvic pressure.   °· Your baby is not moving as much as it used to.   °  °This information is not intended to replace advice given to you by your health care provider. Make sure you discuss any questions you have with your health care   provider. °  °Document Released: 12/24/2004 Document Revised: 12/29/2012 Document Reviewed: 10/05/2012 °Elsevier Interactive Patient Education ©2016 Elsevier Inc. ° °

## 2015-08-22 ENCOUNTER — Telehealth: Payer: Self-pay | Admitting: *Deleted

## 2015-08-22 DIAGNOSIS — Z789 Other specified health status: Secondary | ICD-10-CM

## 2015-08-22 MED ORDER — BREAST PUMP MISC: 0 refills | Status: DC

## 2015-08-22 NOTE — Telephone Encounter (Signed)
Received a voicemail requesting an order to be faxed for a breast pump. Order sent to the pharmacy.

## 2015-08-24 ENCOUNTER — Encounter (HOSPITAL_COMMUNITY): Payer: Self-pay

## 2015-08-24 ENCOUNTER — Inpatient Hospital Stay (HOSPITAL_COMMUNITY)
Admission: AD | Admit: 2015-08-24 | Discharge: 2015-08-24 | Disposition: A | Discharge status: Home / Self Care | Payer: BLUE CROSS/BLUE SHIELD | Source: Ambulatory Visit | Attending: Obstetrics and Gynecology | Admitting: Obstetrics and Gynecology

## 2015-08-24 ENCOUNTER — Ambulatory Visit (INDEPENDENT_AMBULATORY_CARE_PROVIDER_SITE_OTHER): Payer: BLUE CROSS/BLUE SHIELD | Admitting: Obstetrics & Gynecology

## 2015-08-24 VITALS — BP 103/64 | HR 103 | Wt 234.0 lb

## 2015-08-24 DIAGNOSIS — Z3A40 40 weeks gestation of pregnancy: Secondary | ICD-10-CM | POA: Diagnosis not present

## 2015-08-24 DIAGNOSIS — Z3493 Encounter for supervision of normal pregnancy, unspecified, third trimester: Secondary | ICD-10-CM

## 2015-08-24 DIAGNOSIS — B372 Candidiasis of skin and nail: Secondary | ICD-10-CM

## 2015-08-24 DIAGNOSIS — Z3403 Encounter for supervision of normal first pregnancy, third trimester: Secondary | ICD-10-CM

## 2015-08-24 MED ORDER — NYSTATIN-TRIAMCINOLONE 100000-0.1 UNIT/GM-% EX OINT: 1.0000 "application " | TOPICAL_OINTMENT | Freq: Three times a day (TID) | CUTANEOUS | 2 refills | Status: DC

## 2015-08-24 NOTE — MAU Note (Signed)
Notified provider that patient is here for a labor eval. Patient is 1.5/50/-2 on cervical exam which is unchanged from this morning ctx every 15 mins. Provider said could be discharged with labor precautions.

## 2015-08-24 NOTE — Patient Instructions (Signed)
AREA PEDIATRIC/FAMILY PRACTICE PHYSICIANS  [facility] [address] [place], [place] [address] Phone - [phone]   Fax - [phone]  [facility] [address] [place], [place]  [address] Phone - [phone]   Fax - [phone]  [facility] [address] [place], [place]  [address] Phone - [phone]   Fax - [phone]  [facility] [address] [place], [place]  [address] Phone - [phone]   Fax - [phone]  [facility] [address] [place], [place]  [address] Phone - [phone]   Fax - [phone]  [facility] [address] [place], [place]  [address] Phone - [phone]   Fax - [phone]  [facility] [address] [place], [place]  [address] Phone - [phone]   Fax - [phone]  [facility] [address] [place], [place]  [address] Phone - [phone]   Fax - [phone]  [facility] [address] [place], [place]  [address] Phone - [phone]   Fax - [phone] [facility] [address] [place], [place]  [address] Phone - [phone]   Fax - [phone]  [facility] [address] [place], [place]  [address] Phone - [phone]   Fax - [phone]  [facility] [address] [place], [place]  [address] Phone - [phone]   Fax - [phone]  [facility] [address] [place], [place]  [address] Phone - [phone]   Fax - [phone]  [facility]IL[facility] GOSRANI [address] [place], [place]  [address] Phone - [phone]  [facility] [address] [place], [place]  [address] Phone - [phone]   Fax - [phone]  [facility] [address] [place], [place]  [address] Phone - [phone]   Fax - [phone]  [facility] [address] [place], [place]  [address] Phone - [phone]   Fax - [phone]  [facility] [address] [place], [place]  [address] Phone - [phone]   Fax - [phone]   [facility] [address] [place], [place]  [address] Phone - [phone]   Fax - [phone]  [facility] [address] [place], [place]  [address] Phone - [phone]   Fax - [phone]  [facility] RUB[facility] [address] , [place]  [address] Phone - [phone]   Fax - [phone]  [facility] [address] , [place]  [address] Phone - [phone]   Fax - [phone]  [facility] [address] , [place]  [address] Phone - [phone]   Fax - [phone] [facility] [address] [place], [place]  [address] Phone - [phone]   Fax - [phone]  [facility] [address] [place], [place]  [address] Phone - [phone]   Fax - [phone]  [facility] [address] [place], [place]  [address] Phone - [phone]   Fax - [phone]   

## 2015-08-24 NOTE — Progress Notes (Signed)
Subjective:  Darlene Hayes is a 30 y.o. G1P0000 at 376w5d being seen today for ongoing prenatal care.  She is currently monitored for the following issues for this low-risk pregnancy and has Smoker; Obesity, unspecified; Supervision of low-risk pregnancy; Obesity affecting pregnancy, antepartum; Tobacco use affecting pregnancy, antepartum; Marijuana smoker (HCC); and Anemia affecting pregnancy in third trimester on her problem list.  Patient reports increased itching in junction of abdomen and thighs.  Contractions: Not present. Vag. Bleeding: None.  Movement: Present. Denies leaking of fluid.   The following portions of the patient's history were reviewed and updated as appropriate: allergies, current medications, past family history, past medical history, past social history, past surgical history and problem list. Problem list updated.  Objective:   Vitals:   08/24/15 1322  BP: 103/64  Pulse: (!) 103  Weight: 234 lb (106.1 kg)    Fetal Status: Fetal Heart Rate (bpm): 155 Fundal Height: 40 cm Movement: Present  Presentation: Vertex  General:  Alert, oriented and cooperative. Patient is in no acute distress.  Skin: Skin is warm and dry. No rash noted.   Cardiovascular: Normal heart rate noted  Respiratory: Normal respiratory effort, no problems with respiration noted  Abdomen: Soft, gravid, appropriate for gestational age. Pain/Pressure: Present     Pelvic:  Cervical exam performed Dilation: 1.5 Effacement (%): 50 Station: -3  Extremities: Normal range of motion.  Edema: Mild pitting, slight indentation. Erythema and excoriation noted in junction between abdomen and upper thighs concerning for candidal infection.  Mental Status: Normal mood and affect. Normal behavior. Normal judgment and thought content.   Urinalysis: Urine Protein: Trace Urine Glucose: Negative  Assessment and Plan:  Pregnancy: G1P0000 at 546w5d 1. Yeast infection of the skin Will treat with medication below,  and monitor progress. - nystatin-triamcinolone ointment (MYCOLOG); Apply 1 application topically 3 (three) times daily. Apply to affected area  Dispense: 60 g; Refill: 2  2. Supervision of low-risk pregnancy, third trimester Post dates testing next week Term labor symptoms and general obstetric precautions including but not limited to vaginal bleeding, contractions, leaking of fluid and fetal movement were reviewed in detail with the patient. Please refer to After Visit Summary for other counseling recommendations.  Return for 08/28/15 NST, AFI.  08/31/15 NST and OB visit.  IOL on 09/02/15. Schedule Postpartum Visit in 6 weeks  .   Tereso NewcomerUgonna A Elvia Aydin, MD

## 2015-08-24 NOTE — Discharge Instructions (Signed)
Fetal Movement Counts °Patient Name: __________________________________________________ Patient Due Date: ____________________ °Performing a fetal movement count is highly recommended in high-risk pregnancies, but it is good for every pregnant woman to do. Your health care provider may ask you to start counting fetal movements at 28 weeks of the pregnancy. Fetal movements often increase: °· After eating a full meal. °· After physical activity. °· After eating or drinking something sweet or cold. °· At rest. °Pay attention to when you feel the baby is most active. This will help you notice a pattern of your baby's sleep and wake cycles and what factors contribute to an increase in fetal movement. It is important to perform a fetal movement count at the same time each day when your baby is normally most active.  °HOW TO COUNT FETAL MOVEMENTS °1. Find a quiet and comfortable area to sit or lie down on your left side. Lying on your left side provides the best blood and oxygen circulation to your baby. °2. Write down the day and time on a sheet of paper or in a journal. °3. Start counting kicks, flutters, swishes, rolls, or jabs in a 2-hour period. You should feel at least 10 movements within 2 hours. °4. If you do not feel 10 movements in 2 hours, wait 2-3 hours and count again. Look for a change in the pattern or not enough counts in 2 hours. °SEEK MEDICAL CARE IF: °· You feel less than 10 counts in 2 hours, tried twice. °· There is no movement in over an hour. °· The pattern is changing or taking longer each day to reach 10 counts in 2 hours. °· You feel the baby is not moving as he or she usually does. °Date: ____________ Movements: ____________ Start time: ____________ Finish time: ____________  °Date: ____________ Movements: ____________ Start time: ____________ Finish time: ____________ °Date: ____________ Movements: ____________ Start time: ____________ Finish time: ____________ °Date: ____________ Movements:  ____________ Start time: ____________ Finish time: ____________ °Date: ____________ Movements: ____________ Start time: ____________ Finish time: ____________ °Date: ____________ Movements: ____________ Start time: ____________ Finish time: ____________ °Date: ____________ Movements: ____________ Start time: ____________ Finish time: ____________ °Date: ____________ Movements: ____________ Start time: ____________ Finish time: ____________  °Date: ____________ Movements: ____________ Start time: ____________ Finish time: ____________ °Date: ____________ Movements: ____________ Start time: ____________ Finish time: ____________ °Date: ____________ Movements: ____________ Start time: ____________ Finish time: ____________ °Date: ____________ Movements: ____________ Start time: ____________ Finish time: ____________ °Date: ____________ Movements: ____________ Start time: ____________ Finish time: ____________ °Date: ____________ Movements: ____________ Start time: ____________ Finish time: ____________ °Date: ____________ Movements: ____________ Start time: ____________ Finish time: ____________  °Date: ____________ Movements: ____________ Start time: ____________ Finish time: ____________ °Date: ____________ Movements: ____________ Start time: ____________ Finish time: ____________ °Date: ____________ Movements: ____________ Start time: ____________ Finish time: ____________ °Date: ____________ Movements: ____________ Start time: ____________ Finish time: ____________ °Date: ____________ Movements: ____________ Start time: ____________ Finish time: ____________ °Date: ____________ Movements: ____________ Start time: ____________ Finish time: ____________ °Date: ____________ Movements: ____________ Start time: ____________ Finish time: ____________  °Date: ____________ Movements: ____________ Start time: ____________ Finish time: ____________ °Date: ____________ Movements: ____________ Start time: ____________ Finish  time: ____________ °Date: ____________ Movements: ____________ Start time: ____________ Finish time: ____________ °Date: ____________ Movements: ____________ Start time: ____________ Finish time: ____________ °Date: ____________ Movements: ____________ Start time: ____________ Finish time: ____________ °Date: ____________ Movements: ____________ Start time: ____________ Finish time: ____________ °Date: ____________ Movements: ____________ Start time: ____________ Finish time: ____________  °Date: ____________ Movements: ____________ Start time: ____________ Finish   time: ____________ °Date: ____________ Movements: ____________ Start time: ____________ Finish time: ____________ °Date: ____________ Movements: ____________ Start time: ____________ Finish time: ____________ °Date: ____________ Movements: ____________ Start time: ____________ Finish time: ____________ °Date: ____________ Movements: ____________ Start time: ____________ Finish time: ____________ °Date: ____________ Movements: ____________ Start time: ____________ Finish time: ____________ °Date: ____________ Movements: ____________ Start time: ____________ Finish time: ____________  °Date: ____________ Movements: ____________ Start time: ____________ Finish time: ____________ °Date: ____________ Movements: ____________ Start time: ____________ Finish time: ____________ °Date: ____________ Movements: ____________ Start time: ____________ Finish time: ____________ °Date: ____________ Movements: ____________ Start time: ____________ Finish time: ____________ °Date: ____________ Movements: ____________ Start time: ____________ Finish time: ____________ °Date: ____________ Movements: ____________ Start time: ____________ Finish time: ____________ °Date: ____________ Movements: ____________ Start time: ____________ Finish time: ____________  °Date: ____________ Movements: ____________ Start time: ____________ Finish time: ____________ °Date: ____________  Movements: ____________ Start time: ____________ Finish time: ____________ °Date: ____________ Movements: ____________ Start time: ____________ Finish time: ____________ °Date: ____________ Movements: ____________ Start time: ____________ Finish time: ____________ °Date: ____________ Movements: ____________ Start time: ____________ Finish time: ____________ °Date: ____________ Movements: ____________ Start time: ____________ Finish time: ____________ °Date: ____________ Movements: ____________ Start time: ____________ Finish time: ____________  °Date: ____________ Movements: ____________ Start time: ____________ Finish time: ____________ °Date: ____________ Movements: ____________ Start time: ____________ Finish time: ____________ °Date: ____________ Movements: ____________ Start time: ____________ Finish time: ____________ °Date: ____________ Movements: ____________ Start time: ____________ Finish time: ____________ °Date: ____________ Movements: ____________ Start time: ____________ Finish time: ____________ °Date: ____________ Movements: ____________ Start time: ____________ Finish time: ____________ °  °This information is not intended to replace advice given to you by your health care provider. Make sure you discuss any questions you have with your health care provider. °  °Document Released: 01/23/2006 Document Revised: 01/14/2014 Document Reviewed: 10/21/2011 °Elsevier Interactive Patient Education ©2016 [employer] Inc[employer] °Braxton Hicks Contractions °Contractions of the uterus can occur throughout pregnancy. Contractions are not always a sign that you are in labor.  °WHAT ARE BRAXTON HICKS CONTRACTIONS?  °Contractions that occur before labor are called Braxton Hicks contractions, or false labor. Toward the end of pregnancy (32-34 weeks), these contractions can develop more often and may become more forceful. This is not true labor because these contractions do not result in opening (dilatation) and thinning of  the cervix. They are sometimes difficult to tell apart from true labor because these contractions can be forceful and people have different pain tolerances. You should not feel embarrassed if you go to the hospital with false labor. Sometimes, the only way to tell if you are in true labor is for your health care provider to look for changes in the cervix. °If there are no prenatal problems or other health problems associated with the pregnancy, it is completely safe to be sent home with false labor and await the onset of true labor. °HOW CAN YOU TELL THE DIFFERENCE BETWEEN TRUE AND FALSE LABOR? °False Labor °· The contractions of false labor are usually shorter and not as hard as those of true labor.   °· The contractions are usually irregular.   °· The contractions are often felt in the front of the lower abdomen and in the groin.   °· The contractions may go away when you walk around or change positions while lying down.   °· The contractions get weaker and are shorter lasting as time goes on.   °· The contractions do not usually become progressively stronger, regular, and closer together as with true labor.   °True Labor °· Contractions in true   labor last 30-70 seconds, become very regular, usually become more intense, and increase in frequency.   °· The contractions do not go away with walking.   °· The discomfort is usually felt in the top of the uterus and spreads to the lower abdomen and low back.   °· True labor can be determined by your health care provider with an exam. This will show that the cervix is dilating and getting thinner.   °WHAT TO REMEMBER °· Keep up with your usual exercises and follow other instructions given by your health care provider.   °· Take medicines as directed by your health care provider.   °· Keep your regular prenatal appointments.   °· Eat and drink lightly if you think you are going into labor.   °· If Braxton Hicks contractions are making you uncomfortable:   °¨ Change your  position from lying down or resting to walking, or from walking to resting.   °¨ Sit and rest in a tub of warm water.   °¨ Drink 2-3 glasses of water. Dehydration may cause these contractions.   °¨ Do slow and deep breathing several times an hour.   °WHEN SHOULD I SEEK IMMEDIATE MEDICAL CARE? °Seek immediate medical care if: °· Your contractions become stronger, more regular, and closer together.   °· You have fluid leaking or gushing from your vagina.   °· You have a fever.   °· You pass blood-tinged mucus.   °· You have vaginal bleeding.   °· You have continuous abdominal pain.   °· You have low back pain that you never had before.   °· You feel your baby's head pushing down and causing pelvic pressure.   °· Your baby is not moving as much as it used to.   °  °This information is not intended to replace advice given to you by your health care provider. Make sure you discuss any questions you have with your health care provider. °  °Document Released: 12/24/2004 Document Revised: 12/29/2012 Document Reviewed: 10/05/2012 °Elsevier Interactive Patient Education ©2016 [employer] Inc. ° °

## 2015-08-24 NOTE — MAU Note (Signed)
Patient presents with c/o ctxs that started around 1800 today 15-20 mins apart.

## 2015-08-28 ENCOUNTER — Ambulatory Visit (INDEPENDENT_AMBULATORY_CARE_PROVIDER_SITE_OTHER): Payer: BLUE CROSS/BLUE SHIELD | Admitting: *Deleted

## 2015-08-28 VITALS — BP 119/63 | HR 92 | Wt 233.0 lb

## 2015-08-28 DIAGNOSIS — O480 Post-term pregnancy: Secondary | ICD-10-CM

## 2015-08-28 DIAGNOSIS — Z36 Encounter for antenatal screening of mother: Secondary | ICD-10-CM

## 2015-08-31 ENCOUNTER — Ambulatory Visit (INDEPENDENT_AMBULATORY_CARE_PROVIDER_SITE_OTHER): Payer: BLUE CROSS/BLUE SHIELD | Admitting: Obstetrics & Gynecology

## 2015-08-31 ENCOUNTER — Telehealth (HOSPITAL_COMMUNITY): Payer: Self-pay | Admitting: *Deleted

## 2015-08-31 VITALS — BP 115/68 | HR 106 | Wt 231.0 lb

## 2015-08-31 DIAGNOSIS — O480 Post-term pregnancy: Secondary | ICD-10-CM

## 2015-08-31 NOTE — Progress Notes (Signed)
Inductions scheduled for 09/02/15 at 1145pm

## 2015-08-31 NOTE — Telephone Encounter (Signed)
Preadmission screen  

## 2015-08-31 NOTE — Progress Notes (Signed)
Subjective:  Darlene Hayes is a 30 y.o. G1P0000 at 1961w5d being seen today for ongoing prenatal care.  She is currently monitored for the following issues for this low-risk pregnancy and has Smoker; Obesity, unspecified; Supervision of low-risk pregnancy; Obesity affecting pregnancy, antepartum; Tobacco use affecting pregnancy, antepartum; Marijuana smoker (HCC); and Anemia affecting pregnancy in third trimester on her problem list.  Patient reports no complaints.  Contractions: Not present. Vag. Bleeding: None.  Movement: Present. Denies leaking of fluid.   The following portions of the patient's history were reviewed and updated as appropriate: allergies, current medications, past family history, past medical history, past social history, past surgical history and problem list. Problem list updated.  Objective:   Vitals:   08/31/15 1019  BP: 115/68  Pulse: (!) 106  Weight: 231 lb (104.8 kg)    Fetal Status: Fetal Heart Rate (bpm): NST   Movement: Present     General:  Alert, oriented and cooperative. Patient is in no acute distress.  Skin: Skin is warm and dry. No rash noted.   Cardiovascular: Normal heart rate noted  Respiratory: Normal respiratory effort, no problems with respiration noted  Abdomen: Soft, gravid, appropriate for gestational age. Pain/Pressure: Present     Pelvic:  Cervical exam performed        Extremities: Normal range of motion.  Edema: Trace  Mental Status: Normal mood and affect. Normal behavior. Normal judgment and thought content.   Urinalysis:      Assessment and Plan:  Pregnancy: G1P0000 at 3961w5d  There are no diagnoses linked to this encounter. Term labor symptoms and general obstetric precautions including but not limited to vaginal bleeding, contractions, leaking of fluid and fetal movement were reviewed in detail with the patient.  IOL at [redacted] weeks EGA scheduled  Please refer to After Visit Summary for other counseling recommendations.  No  Follow-up on file.   Allie BossierMyra C Zayvon Alicea, MD

## 2015-09-01 ENCOUNTER — Telehealth (HOSPITAL_COMMUNITY): Payer: Self-pay | Admitting: *Deleted

## 2015-09-01 ENCOUNTER — Encounter (HOSPITAL_COMMUNITY): Payer: Self-pay | Admitting: *Deleted

## 2015-09-01 NOTE — Telephone Encounter (Signed)
Preadmission screen  

## 2015-09-03 ENCOUNTER — Inpatient Hospital Stay (HOSPITAL_COMMUNITY): Payer: BLUE CROSS/BLUE SHIELD | Admitting: Anesthesiology

## 2015-09-03 ENCOUNTER — Inpatient Hospital Stay (HOSPITAL_COMMUNITY)
Admission: RE | Admit: 2015-09-03 | Discharge: 2015-09-05 | DRG: 775 | Disposition: A | Discharge status: Home / Self Care | Payer: BLUE CROSS/BLUE SHIELD | Source: Ambulatory Visit | Attending: Obstetrics & Gynecology | Admitting: Obstetrics & Gynecology

## 2015-09-03 ENCOUNTER — Encounter (HOSPITAL_COMMUNITY): Payer: Self-pay

## 2015-09-03 DIAGNOSIS — K219 Gastro-esophageal reflux disease without esophagitis: Secondary | ICD-10-CM | POA: Diagnosis present

## 2015-09-03 DIAGNOSIS — Z8249 Family history of ischemic heart disease and other diseases of the circulatory system: Secondary | ICD-10-CM | POA: Diagnosis not present

## 2015-09-03 DIAGNOSIS — F1290 Cannabis use, unspecified, uncomplicated: Secondary | ICD-10-CM | POA: Diagnosis present

## 2015-09-03 DIAGNOSIS — Z87891 Personal history of nicotine dependence: Secondary | ICD-10-CM | POA: Diagnosis not present

## 2015-09-03 DIAGNOSIS — O99324 Drug use complicating childbirth: Secondary | ICD-10-CM | POA: Diagnosis present

## 2015-09-03 DIAGNOSIS — Z833 Family history of diabetes mellitus: Secondary | ICD-10-CM | POA: Diagnosis not present

## 2015-09-03 DIAGNOSIS — O480 Post-term pregnancy: Secondary | ICD-10-CM | POA: Diagnosis present

## 2015-09-03 DIAGNOSIS — Z823 Family history of stroke: Secondary | ICD-10-CM | POA: Diagnosis not present

## 2015-09-03 DIAGNOSIS — O9962 Diseases of the digestive system complicating childbirth: Secondary | ICD-10-CM | POA: Diagnosis present

## 2015-09-03 DIAGNOSIS — Z3A41 41 weeks gestation of pregnancy: Secondary | ICD-10-CM

## 2015-09-03 LAB — CBC
HEMATOCRIT: 27.1 % — AB (ref 36.0–46.0)
HEMOGLOBIN: 8.5 g/dL — AB (ref 12.0–15.0)
MCH: 23.2 pg — AB (ref 26.0–34.0)
MCHC: 31.4 g/dL (ref 30.0–36.0)
MCV: 74 fL — AB (ref 78.0–100.0)
Platelets: 220 10*3/uL (ref 150–400)
RBC: 3.66 MIL/uL — ABNORMAL LOW (ref 3.87–5.11)
RDW: 18.8 % — ABNORMAL HIGH (ref 11.5–15.5)
WBC: 7.3 10*3/uL (ref 4.0–10.5)

## 2015-09-03 LAB — ABO/RH: ABO/RH(D): A POS

## 2015-09-03 LAB — TYPE AND SCREEN
ABO/RH(D): A POS
Antibody Screen: NEGATIVE

## 2015-09-03 LAB — RPR: RPR Ser Ql: NONREACTIVE

## 2015-09-03 MED ORDER — IBUPROFEN 600 MG PO TABS
600.0000 mg | ORAL_TABLET | Freq: Four times a day (QID) | ORAL | Status: DC
Start: 2015-09-04 — End: 2015-09-05
  Administered 2015-09-04 – 2015-09-05 (×4): 600 mg via ORAL
  Filled 2015-09-03 (×5): qty 1

## 2015-09-03 MED ORDER — EPHEDRINE 5 MG/ML INJ
10.0000 mg | INTRAVENOUS | Status: DC | PRN
  Filled 2015-09-03: qty 4

## 2015-09-03 MED ORDER — ONDANSETRON HCL 4 MG/2ML IJ SOLN: 4.0000 mg | INTRAMUSCULAR | Status: DC | PRN

## 2015-09-03 MED ORDER — SOD CITRATE-CITRIC ACID 500-334 MG/5ML PO SOLN: 30.0000 mL | ORAL | Status: DC | PRN

## 2015-09-03 MED ORDER — BENZOCAINE-MENTHOL 20-0.5 % EX AERO
1.0000 "application " | INHALATION_SPRAY | CUTANEOUS | Status: DC | PRN
  Administered 2015-09-04: 1 via TOPICAL
  Filled 2015-09-03 (×2): qty 56

## 2015-09-03 MED ORDER — TERBUTALINE SULFATE 1 MG/ML IJ SOLN
0.2500 mg | Freq: Once | INTRAMUSCULAR | Status: DC | PRN
  Filled 2015-09-03: qty 1

## 2015-09-03 MED ORDER — OXYTOCIN 40 UNITS IN LACTATED RINGERS INFUSION - SIMPLE MED
1.0000 m[IU]/min | INTRAVENOUS | Status: DC
  Administered 2015-09-03: 2 m[IU]/min via INTRAVENOUS
  Filled 2015-09-03: qty 1000

## 2015-09-03 MED ORDER — DIPHENHYDRAMINE HCL 50 MG/ML IJ SOLN: 12.5000 mg | INTRAMUSCULAR | Status: DC | PRN

## 2015-09-03 MED ORDER — LACTATED RINGERS IV SOLN
INTRAVENOUS | Status: DC
  Administered 2015-09-03 (×2): via INTRAVENOUS

## 2015-09-03 MED ORDER — LACTATED RINGERS IV SOLN: 500.0000 mL | Freq: Once | INTRAVENOUS | Status: DC

## 2015-09-03 MED ORDER — PHENYLEPHRINE 40 MCG/ML (10ML) SYRINGE FOR IV PUSH (FOR BLOOD PRESSURE SUPPORT)
80.0000 ug | PREFILLED_SYRINGE | INTRAVENOUS | Status: DC | PRN
  Filled 2015-09-03: qty 5

## 2015-09-03 MED ORDER — SODIUM CHLORIDE 0.9% FLUSH: 3.0000 mL | INTRAVENOUS | Status: DC | PRN

## 2015-09-03 MED ORDER — ACETAMINOPHEN 325 MG PO TABS: 650.0000 mg | ORAL_TABLET | ORAL | Status: DC | PRN

## 2015-09-03 MED ORDER — LACTATED RINGERS IV SOLN: 500.0000 mL | INTRAVENOUS | Status: DC | PRN

## 2015-09-03 MED ORDER — SIMETHICONE 80 MG PO CHEW: 80.0000 mg | CHEWABLE_TABLET | ORAL | Status: DC | PRN

## 2015-09-03 MED ORDER — PHENYLEPHRINE 40 MCG/ML (10ML) SYRINGE FOR IV PUSH (FOR BLOOD PRESSURE SUPPORT)
80.0000 ug | PREFILLED_SYRINGE | INTRAVENOUS | Status: DC | PRN
  Filled 2015-09-03: qty 10
  Filled 2015-09-03: qty 5

## 2015-09-03 MED ORDER — LIDOCAINE HCL (PF) 1 % IJ SOLN
30.0000 mL | INTRAMUSCULAR | Status: DC | PRN
  Filled 2015-09-03: qty 30

## 2015-09-03 MED ORDER — OXYCODONE-ACETAMINOPHEN 5-325 MG PO TABS: 2.0000 | ORAL_TABLET | ORAL | Status: DC | PRN

## 2015-09-03 MED ORDER — SODIUM CHLORIDE 0.9% FLUSH: 3.0000 mL | Freq: Two times a day (BID) | INTRAVENOUS | Status: DC

## 2015-09-03 MED ORDER — OXYTOCIN BOLUS FROM INFUSION
500.0000 mL | Freq: Once | INTRAVENOUS | Status: AC
  Administered 2015-09-03: 500 mL via INTRAVENOUS

## 2015-09-03 MED ORDER — FLEET ENEMA 7-19 GM/118ML RE ENEM
1.0000 | ENEMA | RECTAL | Status: DC | PRN
Start: 2015-09-03 — End: 2015-09-03

## 2015-09-03 MED ORDER — FENTANYL 2.5 MCG/ML BUPIVACAINE 1/10 % EPIDURAL INFUSION (WH - ANES)
14.0000 mL/h | INTRAMUSCULAR | Status: DC | PRN
  Administered 2015-09-03 (×3): 14 mL/h via EPIDURAL
  Filled 2015-09-03 (×2): qty 125

## 2015-09-03 MED ORDER — ZOLPIDEM TARTRATE 5 MG PO TABS: 5.0000 mg | ORAL_TABLET | Freq: Every evening | ORAL | Status: DC | PRN

## 2015-09-03 MED ORDER — TETANUS-DIPHTH-ACELL PERTUSSIS 5-2.5-18.5 LF-MCG/0.5 IM SUSP: 0.5000 mL | Freq: Once | INTRAMUSCULAR | Status: DC

## 2015-09-03 MED ORDER — MISOPROSTOL 25 MCG QUARTER TABLET
25.0000 ug | ORAL_TABLET | ORAL | Status: DC | PRN
  Administered 2015-09-03: 25 ug via VAGINAL
  Filled 2015-09-03: qty 1
  Filled 2015-09-03: qty 0.25

## 2015-09-03 MED ORDER — PRENATAL MULTIVITAMIN CH: 1.0000 | ORAL_TABLET | Freq: Every day | ORAL | Status: DC

## 2015-09-03 MED ORDER — MEASLES, MUMPS & RUBELLA VAC ~~LOC~~ INJ
0.5000 mL | INJECTION | Freq: Once | SUBCUTANEOUS | Status: DC
  Filled 2015-09-03: qty 0.5

## 2015-09-03 MED ORDER — SODIUM CHLORIDE 0.9 % IV SOLN: 250.0000 mL | INTRAVENOUS | Status: DC | PRN

## 2015-09-03 MED ORDER — LIDOCAINE HCL (PF) 1 % IJ SOLN
INTRAMUSCULAR | Status: DC | PRN
  Administered 2015-09-03: 6 mL via EPIDURAL
  Administered 2015-09-03: 4 mL via EPIDURAL

## 2015-09-03 MED ORDER — COCONUT OIL OIL
1.0000 "application " | TOPICAL_OIL | Status: DC | PRN
  Filled 2015-09-03: qty 120

## 2015-09-03 MED ORDER — DIPHENHYDRAMINE HCL 25 MG PO CAPS: 25.0000 mg | ORAL_CAPSULE | Freq: Four times a day (QID) | ORAL | Status: DC | PRN

## 2015-09-03 MED ORDER — ONDANSETRON HCL 4 MG/2ML IJ SOLN
4.0000 mg | Freq: Four times a day (QID) | INTRAMUSCULAR | Status: DC | PRN
  Administered 2015-09-03: 4 mg via INTRAVENOUS
  Filled 2015-09-03: qty 2

## 2015-09-03 MED ORDER — OXYTOCIN 40 UNITS IN LACTATED RINGERS INFUSION - SIMPLE MED: 2.5000 [IU]/h | INTRAVENOUS | Status: DC

## 2015-09-03 MED ORDER — OXYCODONE-ACETAMINOPHEN 5-325 MG PO TABS: 1.0000 | ORAL_TABLET | ORAL | Status: DC | PRN

## 2015-09-03 MED ORDER — ONDANSETRON HCL 4 MG PO TABS: 4.0000 mg | ORAL_TABLET | ORAL | Status: DC | PRN

## 2015-09-03 MED ORDER — SENNOSIDES-DOCUSATE SODIUM 8.6-50 MG PO TABS
2.0000 | ORAL_TABLET | ORAL | Status: DC
  Administered 2015-09-04 (×2): 2 via ORAL
  Filled 2015-09-03 (×2): qty 2

## 2015-09-03 NOTE — Progress Notes (Signed)
Darlene Hayes is a 30 y.o. G1P0000 at 3781w1d by ultrasound admitted for induction of labor due to Post dates. Due date 08/26/15.  Subjective:   Objective: BP 127/70   Pulse 72   Temp 98.8 F (37.1 C) (Oral)   Resp 18   Ht 5\' 6"  (1.676 m)   Wt 105.7 kg (233 lb)   LMP 11/19/2014 (Exact Date)   SpO2 100%   BMI 37.61 kg/m  No intake/output data recorded. No intake/output data recorded.  FHT:  FHR: 130-140 bpm, variability: moderate,  accelerations:  Present,  decelerations:  Present early UC:   regular, every 4-5 minutes SVE:   Dilation: 5 Effacement (%): 70 Station: -2 Exam by:: Darlene Hayes, SNMW  Labs: Lab Results  Component Value Date   WBC 7.3 09/03/2015   HGB 8.5 (L) 09/03/2015   HCT 27.1 (L) 09/03/2015   MCV 74.0 (L) 09/03/2015   PLT 220 09/03/2015    Assessment / Plan: Spontaneous labor, progressing normally  Labor: Progressing normally Preeclampsia:  no signs or symptoms of toxicity, intake and ouput balanced and labs stable Fetal Wellbeing:  Category I Pain Control:  Epidural I/D:  n/a Anticipated MOD:  NSVD  Darlene Hayes 09/03/2015, 9:34 AM

## 2015-09-03 NOTE — Progress Notes (Signed)
Admission nutrition screen triggered for unintentional weight loss > 10 lbs within the last month. . Patients chart reviewed and assessed  for nutritional risk. Patient is determined to be at low nutrition  risk.    Girtie Wiersma M.Ed. R.D. LDN Neonatal Nutrition Support Specialist/RD III Pager [phone]      Phone [phone]  

## 2015-09-03 NOTE — Anesthesia Pain Management Evaluation Note (Signed)
  CRNA Pain Management Visit Note  Patient: Darlene Hayes, 30 y.o., female  "Hello [facility] am [facility]. We have an anesthesia team available at all times to provide care throughout the hospital, including epidural management and anesthesia for C-section. I don't know your plan for the delivery whether it a natural birth, water birth, IV sedation, nitrous supplementation, doula or epidural, but we want to meet your pain goals."   1.Was your pain managed to your expectations on prior hospitalizations?   No prior hospitalizations  2.What is your expectation for pain management during this hospitalization?     Epidural  3.How can we help you reach that goal? Epidural   Record the patient's initial score and the patient's pain goal.   Pain: 5  Pain Goal: 7 [facility] wants you to be able to say your pain was always managed very well.  Sagan Maselli 09/03/2015

## 2015-09-03 NOTE — Progress Notes (Signed)
Darlene Hayes is a 30 y.o. G1P0000 at 6131w1d by ultrasound admitted for induction of labor due to Post dates. Due date 08/26/15.  Subjective:   Objective: BP 126/76   Pulse 77   Temp 98.3 F (36.8 C)   Resp 18   Ht 5\' 6"  (1.676 m)   Wt 233 lb (105.7 kg)   LMP 11/19/2014 (Exact Date)   SpO2 100%   BMI 37.61 kg/m  No intake/output data recorded. No intake/output data recorded.  FHT:  FHR: 120's bpm, variability: moderate,  accelerations:  Present,  decelerations:  Present early UC:   regular, every 3-4 minutes SVE:   Dilation: 9 Effacement (%): 100 Station: 0 Exam by:: k fields, rn  Labs: Lab Results  Component Value Date   WBC 7.3 09/03/2015   HGB 8.5 (L) 09/03/2015   HCT 27.1 (L) 09/03/2015   MCV 74.0 (L) 09/03/2015   PLT 220 09/03/2015    Assessment / Plan: Induction of labor due to postterm,  progressing well on pitocin  Labor: Progressing normally Preeclampsia:  no signs or symptoms of toxicity, intake and ouput balanced and labs stable Fetal Wellbeing:  Category I Pain Control:  Epidural I/D:  n/a Anticipated MOD:  NSVD  Darlene Hayes 09/03/2015, 3:33 PM

## 2015-09-03 NOTE — Anesthesia Procedure Notes (Signed)
Epidural Patient location during procedure: OB Start time: 09/03/2015 9:10 AM End time: 09/03/2015 9:16 AM  Staffing Anesthesiologist: Linton RumpALLAN, Gopal Malter DICKERSON Performed: anesthesiologist   Preanesthetic Checklist Completed: patient identified, surgical consent, pre-op evaluation, timeout performed, IV checked, risks and benefits discussed and monitors and equipment checked  Epidural Patient position: sitting Prep: site prepped and draped and DuraPrep Patient monitoring: continuous pulse ox and blood pressure Approach: midline Location: L3-L4 Injection technique: LOR air  Needle:  Needle type: Tuohy  Needle gauge: 17 G Needle length: 9 cm and 9 Needle insertion depth: 8 cm Catheter type: closed end flexible Catheter size: 19 Gauge Catheter at skin depth: 13 cm Test dose: negative  Assessment Events: blood not aspirated, injection not painful, no injection resistance, negative IV test and no paresthesia  Additional Notes Reason for block:procedure for pain

## 2015-09-03 NOTE — H&P (Signed)
Darlene Hayes is a 30 y.o. female presenting for PD IOLbased on LMP   Clinic  [place] Prenatal Labs  Dating  LMP (unsure)/consistent with [redacted]w[redacted]d [place] Blood type: A/POS/-- (02/20 0945) A POS  Genetic Screen  Quad:   NEGATIVE  Antibody:NEG (02/20 0945)NEG  Anatomic US Wnl. Female, anterior no previa Rubella: 3.81 (02/20 0945)IMMUNE  GTT  Third trimester: 105 RPR: NON REAC (05/15 1201) NR  Flu vaccine  declined HBsAg: NEGATIVE (02/20 0945) NEGATIVE  TDaP vaccine  Declined on 07/04/15                                              HIV: NONREACTIVE (05/15 1201) NONREACTIVE  Baby Food  Breast and bottle                                            GBS:  Negative  Contraception  Declines Pap: Neg, HPV neg (02/27/2015)  Circumcision Yes (has private insurance)   Pediatrician     Support Person Mother and sister     OB History    Gravida Para Term Preterm AB Living   1 0 0 0 0 0   SAB TAB Ectopic Multiple Live Births   0 0 0 0       Past Medical History:  Diagnosis Date  . Abnormal Pap smear    abn paps since 2000. no additional testing done.   . Chlamydia 07/2010   treated. hasn't been with that partner since.   . Gonorrhea    treated in July 2012. hasn't been with that partner since.   . Vaginal Pap smear, abnormal    Past Surgical History:  Procedure Laterality Date  . NO PAST SURGERIES     Family History: family history includes Diabetes in her maternal grandmother; Hypertension in her mother and sister; Miscarriages / Stillbirths in her sister; Stroke in her maternal grandmother. Social History:  reports that she quit smoking about 8 months ago. Her smoking use included Cigarettes. She smoked 0.15 packs per day. She has never used smokeless tobacco. She reports that she uses drugs, including Marijuana. She reports that she does not drink alcohol.     Maternal Diabetes: No Genetic Screening: Normal Maternal Ultrasounds/Referrals: Normal Fetal Ultrasounds or other Referrals:   None Maternal Substance Abuse:  Marajuana Significant Maternal Medications:  None Significant Maternal Lab Results:  None Other Comments:  None, MJ use  Review of Systems  Constitutional: Negative.   HENT: Negative.   Eyes: Negative.   Respiratory: Negative.   Cardiovascular: Negative.   Gastrointestinal: Negative.   Genitourinary: Negative.   Musculoskeletal: Negative.   Skin: Negative.   Neurological: Negative.   Endo/Heme/Allergies: Negative.   Psychiatric/Behavioral: Negative.    Maternal Medical History:  Fetal activity: Perceived fetal activity is normal.   Last perceived fetal movement was within the past hour.      Dilation: 3 Effacement (%): 50 Station: -1, -2 Exam by:: Rogelio Seen, RN  Temperature 98.3 F (36.8 C), temperature source Oral, last menstrual period 11/19/2014. Maternal Exam:  Uterine Assessment: Contraction strength is mild.  Contraction frequency is irregular.   Abdomen: Patient reports no abdominal tenderness. Fetal presentation: vertex  Introitus: Normal vulva. Normal vagina.  Pelvis: adequate for delivery.  Cervix: Cervix evaluated by digital exam.     Fetal Exam Fetal Monitor Review: Mode: ultrasound.   Baseline rate: 140.  Variability: moderate (6-25 bpm).   Pattern: accelerations present and no decelerations.    Fetal State Assessment: Category I - tracings are normal.     Physical Exam  Vitals reviewed. Constitutional: She is oriented to person, place, and time. She appears well-developed and well-nourished.  HENT:  Head: Normocephalic.  Eyes: Conjunctivae and EOM are normal. Pupils are equal, round, and reactive to light.  Neck: Normal range of motion.  Cardiovascular: Normal rate, regular rhythm and normal heart sounds.   Respiratory: Effort normal and breath sounds normal.  GI: Soft. Bowel sounds are normal.  Genitourinary: Vagina normal.  Musculoskeletal: Normal range of motion.  Neurological: She is alert and oriented to  person, place, and time. She has normal reflexes.  Skin: Skin is warm and dry.  Psychiatric: She has a normal mood and affect. Her behavior is normal. Judgment and thought content normal.    Prenatal labs: ABO, Rh: A/POS/-- (02/20 0945) Antibody: NEG (02/20 0945) Rubella: 3.81 (02/20 0945) RPR: NON REAC (05/15 1201)  HBsAg: NEGATIVE (02/20 0945)  HIV: NONREACTIVE (05/15 1201)  GBS: Negative (07/24 0000)   Assessment/Plan: G1P0 @ 41 1/7 by LMP Admit for PD IOL Foley bulb and cytotec placed Plan NSVD  Darlene Hayes 09/03/2015, 1:41 AM   OB fellow attestation:  I have seen and examined this patient; I agree with above documentation in the midwife student's note.   Darlene Hayes is a 30 y.o. G1P0000 at 5061w1d here for PD IOL +FM, denies LOF, VB, contractions, vaginal discharge.  PE: Temp 98.3 F (36.8 C) (Oral)   LMP 11/19/2014 (Exact Date)  Gen: calm comfortable, NAD Resp: normal effort, no distress Abd: gravid  ROS, labs, PMH reviewed NST reactive   Assessment: 1. Labor: IOL 2. Fetal Wellbeing: Category I  3. Pain Control: None 4. GBS: Neg 5. 41.1 week IUP 6. Pos MJ in pregnancy  Plan:  1. Admit to BS per consult with MD 2. Routine L&D orders 3. Analgesia/anesthesia PRN  4. Cytotec. (Attmpted Foley, but would not stay in due to dilation.) 5. SW consult and notify Peds of MJ use. UDS  Darlene Hayes, CNM 2:02 AM

## 2015-09-03 NOTE — Progress Notes (Signed)
Patient ID: Darlene Hayes, female   DOB: 06-Sep-1985, 30 y.o.   MRN: [id number] Pt getting more uncomfortable with contractions. Breathing well through them Foley bulb out @ 0445 SROM clear fluid 0440 Contractions every 2-5 min Plan: Monitor contractions May need pitocin augmentation if contractions space apart.  Plan: NSVD

## 2015-09-03 NOTE — Anesthesia Preprocedure Evaluation (Signed)
Anesthesia Evaluation  Patient identified by MRN, date of birth, ID band Patient awake    Reviewed: Allergy & Precautions, NPO status , Patient's Chart, lab work & pertinent test results  History of Anesthesia Complications Negative for: history of anesthetic complications  Airway Mallampati: III  TM Distance: >3 FB Neck ROM: Full    Dental  (+) Teeth Intact   Pulmonary former smoker,    breath sounds clear to auscultation       Cardiovascular (-) hypertensionnegative cardio ROS   Rhythm:Regular Rate:Normal     Neuro/Psych negative neurological ROS     GI/Hepatic GERD  ,(+)     substance abuse  marijuana use,   Endo/Other  negative endocrine ROS  Renal/GU negative Renal ROS     Musculoskeletal   Abdominal (+) + obese,   Peds  Hematology  (+) Blood dyscrasia, anemia ,   Anesthesia Other Findings   Reproductive/Obstetrics (+) Pregnancy                             Anesthesia Physical Anesthesia Plan  ASA: II  Anesthesia Plan: Epidural   Post-op Pain Management:    Induction:   Airway Management Planned:   Additional Equipment:   Intra-op Plan:   Post-operative Plan:   Informed Consent: I have reviewed the patients History and Physical, chart, labs and discussed the procedure including the risks, benefits and alternatives for the proposed anesthesia with the patient or authorized representative who has indicated his/her understanding and acceptance.     Plan Discussed with:   Anesthesia Plan Comments: (I have discussed risks of neuraxial anesthesia including but not limited to infection, bleeding, nerve injury, back pain, headache, seizures, and failure of block. Patient denies bleeding disorders and is not currently anticoagulated. Labs have been reviewed. Risks and benefits discussed. All patient's questions answered.   Hgb 8.5 Hct 27.1 Platelets 220)         Anesthesia Quick Evaluation

## 2015-09-04 MED ORDER — COMPLETENATE 29-1 MG PO CHEW
1.0000 | CHEWABLE_TABLET | Freq: Every day | ORAL | Status: DC
  Administered 2015-09-04: 1 via ORAL
  Filled 2015-09-04 (×3): qty 1

## 2015-09-04 NOTE — Anesthesia Postprocedure Evaluation (Signed)
Anesthesia Post Note  Patient: Purvis Sheffieldiffany R Haslem  Procedure(s) Performed: * No procedures listed *  Patient location during evaluation: Mother Baby Anesthesia Type: Epidural Level of consciousness: awake and alert and oriented Pain management: pain level controlled Vital Signs Assessment: post-procedure vital signs reviewed and stable Respiratory status: nonlabored ventilation and spontaneous breathing Cardiovascular status: blood pressure returned to baseline Postop Assessment: no headache, no backache, patient able to bend at knees, no signs of nausea or vomiting and adequate PO intake Anesthetic complications: no Comments: Patient states sore back, took anesthesia 6 times to get epidural placed.     Last Vitals:  Vitals:   09/04/15 0111 09/04/15 0552  BP: 128/72 114/64  Pulse: 97 71  Resp: 18 18  Temp: 36.4 C 36.5 C    Last Pain:  Vitals:   09/04/15 0552  TempSrc: Oral  PainSc:    Pain Goal: Patients Stated Pain Goal: 3 (09/03/15 0855)               [facility],Kahliyah Dick

## 2015-09-04 NOTE — Progress Notes (Signed)
Post Partum Day 1 Subjective:  Darlene Hayes is a 30 y.o. G1P1001 2084w1d s/p SVD.  No acute events overnight.  Pt denies problems with ambulating, voiding or po intake.  She denies nausea or vomiting.  Pain is well controlled.  She has not had flatus. She has not had bowel movement.  Lochia Small.  Plan for birth control is no method.  Method of Feeding: Breast & Bottle.   Objective: Blood pressure 114/64, pulse 71, temperature 97.7 F (36.5 C), temperature source Oral, resp. rate 18, height 5\' 6"  (1.676 m), weight 105.7 kg (233 lb), last menstrual period 11/19/2014, SpO2 100 %, unknown if currently breastfeeding.  Physical Exam:  General: alert, cooperative and no distress Lochia:normal flow Chest: CTAB Heart: RRR no m/r/g Abdomen: +BS, soft, nontender,  Uterine Fundus: firm, below umbilicus  DVT Evaluation: No evidence of DVT seen on physical exam. Extremities: 1+ edema   Recent Labs  09/03/15 0050  HGB 8.5*  HCT 27.1*    Assessment/Plan:  ASSESSMENT: Darlene Hayes is a 30 y.o. G1P1001 3584w1d s/p SVD.  Plan for discharge tomorrow and Circumcision prior to discharge  Counseled on spacing pregnancies 1 year.   LOS: 1 day   De HollingsheadCatherine L Wallace 09/04/2015, 7:51 AM   OB FELLOW POSTPARTUM PROGRESS NOTE ATTESTATION  I have seen and examined this patient and agree with above documentation in the resident's note.   Ernestina PennaNicholas Arissa Fagin, MD 9:02 AM

## 2015-09-04 NOTE — Progress Notes (Signed)
LCSW received consult for [name], hx of THC and positive labs during initial prenatal visit. [name] was honest and reported use prior to knowledge of pregnancy with no other documentation supporting THC use.  UDS on baby:  Still pending.  LCSW spoke with [name] briefly and due to family in room, will hold assessment for HIPPA due to discussing substance abuse.  Will follow up with [name] on Tuesday morning. LCSW will follow cord regardless, but at this time no CPS intervention warranted.   Jostin Rue LCSW, MSW Clinical Social Work: System Wide Float Coverage for [facility] N[facility] Clinical social worker [phone] 

## 2015-09-04 NOTE — Discharge Instructions (Signed)

## 2015-09-05 MED ORDER — SENNOSIDES-DOCUSATE SODIUM 8.6-50 MG PO TABS: 2.0000 | ORAL_TABLET | ORAL | 0 refills | Status: DC

## 2015-09-05 MED ORDER — IBUPROFEN 600 MG PO TABS: 600.0000 mg | ORAL_TABLET | Freq: Four times a day (QID) | ORAL | 0 refills | Status: DC

## 2015-09-05 NOTE — Discharge Summary (Signed)
OB Discharge Summary     Patient Name: Darlene Hayes DOB: 01/26/1985 MRN: [id number]  Date of admission: 09/03/2015 Delivering MD: Wyvonnia Dusky D   Date of discharge: 09/05/2015  Admitting diagnosis: 48 WEEKS INDUCTION Intrauterine pregnancy: [redacted]w[redacted]d     Secondary diagnosis:  Active Problems:   Post-dates pregnancy  Additional problems: none     Discharge diagnosis: Term Pregnancy Delivered                                                                                                Post partum procedures:none  Augmentation: Pitocin, Cytotec and Foley Balloon  Complications: None  Hospital course:  Induction of Labor With Vaginal Delivery   30 y.o. yo G1P1001 at [redacted]w[redacted]d was admitted to the hospital 09/03/2015 for induction of labor.  Indication for induction: Postdates.  Patient had an uncomplicated labor course as follows: Membrane Rupture Time/Date: 4:40 AM ,09/03/2015   Intrapartum Procedures: Episiotomy: None [1]                                         Lacerations:     Patient had delivery of a Viable infant.  Information for the patient's newborn:  Darlene, Hayes [[id number]]  Delivery Method: Vaginal, Spontaneous Delivery (Filed from Delivery Summary)   09/03/2015  Details of delivery can be found in separate delivery note.  Patient had a routine postpartum course. Patient is discharged home 09/05/15.   Physical exam Vitals:   09/04/15 0111 09/04/15 0552 09/04/15 1735 09/05/15 0537  BP: 128/72 114/64 112/85 123/68  Pulse: 97 71 73 60  Resp: 18 18 20 18   Temp: 97.6 F (36.4 C) 97.7 F (36.5 C) 98 F (36.7 C) 98.5 F (36.9 C)  TempSrc: Oral Oral Oral Oral  SpO2: 100% 100%    Weight:      Height:       General: alert, cooperative and no distress Lochia: appropriate Uterine Fundus: firm DVT Evaluation: No evidence of DVT seen on physical exam. Negative Homan's sign. Labs: Lab Results  Component Value Date   WBC 7.3 09/03/2015   HGB 8.5 (L)  09/03/2015   HCT 27.1 (L) 09/03/2015   MCV 74.0 (L) 09/03/2015   PLT 220 09/03/2015   CMP Latest Ref Rng & Units 10/13/2012  Glucose 70 - 99 mg/dL 86    Discharge instruction: per After Visit Summary and "Baby and Me Booklet".  After visit meds:    Medication List    TAKE these medications   Breast Pump Misc Dispense one electric breast pump for patient   ibuprofen 600 MG tablet Commonly known as:  ADVIL,MOTRIN Take 1 tablet (600 mg total) by mouth every 6 (six) hours.   nystatin-triamcinolone ointment Commonly known as:  MYCOLOG Apply 1 application topically 3 (three) times daily. Apply to affected area   PRENATAL VITAMINS PLUS 27-1 MG Tabs Take 1 tablet by mouth daily.   senna-docusate 8.6-50 MG tablet Commonly known as:  Senokot-S Take 2 tablets by mouth daily.  Diet: routine diet  Activity: Advance as tolerated. Pelvic rest for 6 weeks.   Outpatient follow up:6 weeks Follow up Appt:Future Appointments Date Time Provider Department Center  10/16/2015 1:15 PM Darlene NewcomerUgonna A Anyanwu, MD [facility]   Follow up Visit: Follow-up Information    [facility]. Schedule an appointment as soon as possible for a visit in 6 week(s).   Specialty:  Obstetrics and Gynecology Contact information: [address] [place] [phone]          Postpartum contraception: declined  Newborn Data: Live born female  Birth Weight: 7 lb 14.8 oz (3595 g) APGAR: 8, 9  Baby Feeding: Bottle Disposition:home with mother   09/05/2015 Leland HerElsia J Yoo, DO PGY-1  Midwife attestation Post Partum Day 1 I have seen and examined this patient and agree with above documentation in the resident's note.   Darlene Hayes is a 30 y.o. G1P1001 s/p NSVD.  Pt denies problems with ambulating, voiding or po intake. Pain is well controlled.  Plan for birth control is no method.  Method of Feeding: breast and  bottle  PE:  BP 123/68 (BP Location: Right Arm)   Pulse 60   Temp 98.5 F (36.9 C) (Oral)   Resp 18   Ht 5\' 6"  (1.676 m)   Wt 233 lb (105.7 kg)   LMP 11/19/2014 (Exact Date)   SpO2 100%   Breastfeeding? Unknown   BMI 37.61 kg/m  Gen: well appearing Heart: reg rate Lungs: normal WOB Fundus firm Ext: soft, no pain, no edema  Plan for discharge: Today  Darlene, Vickee Hayes, CNM 8:55 AM

## 2015-09-05 NOTE — Clinical Social Work Maternal (Signed)
CLINICAL SOCIAL WORK MATERNAL/CHILD NOTE  Patient Details  Name: Darlene Hayes MRN: [id number] Date of Birth: 02/22/1985  Date:  09/05/2015  Clinical Social Worker Initiating Note:  Evalena Fujii N Deanie Jupiter, LCSW            Date/ Time Initiated:  09/05/15/1156                        Child's Name:  Darlene Hayes   Legal Guardian:  Mother   Need for Interpreter:  None   Date of Referral:  09/05/15     Reason for Referral:  Current Substance Use/Substance Use During Pregnancy , Other (Comment) (assessment of needs or community resources)   Referral Source:  RN   Address:     Phone number:      Household Members: Self   Natural Supports (not living in the home): Friends, Immediate Family   Professional Supports:None   Employment:Full-time   Type of Work: [place]   Education:  High school graduate   Financial Resources:Private Insurance   Other Resources: WIC   Cultural/Religious Considerations Which May Impact Care: none reported  Strengths: Ability to meet basic needs , Compliance with medical plan , Home prepared for child , Pediatrician chosen    Risk Factors/Current Problems: Adjustment to Illness    Cognitive State: Alert , Insightful , Goal Oriented    Mood/Affect: Comfortable , Calm , Bright    CSW Assessment:LCSW received consult for hx of THC during pregnancy.  LCSW attempted to meet with [name] on Monday, but family in room. Today she is alone and bonding with baby. She has a bright affect and accepting of assessment. LCSW explained role and reason for consult.  [name] reports prior to pregnancy she did smoke THC, but when she found out she was pregnancy she stopped. She reported and it is documented that she alerted or OB and made it clear her last time smoking was in December.  [name] reports she understands importance of ceasing smoking and has not smoked since and does not plan to continue now that baby has arrived. [name]  reports she has not used any other substances. LCSW notified and educated [name] of hospital policy and no intervention warranted at this time,however if cord is positive then CPS will be contacted and report made. [name] verbalizes understanding.  LCSW assessed any other needs of [name] including home prepared for child, PPD, and support. [name] reports she is going to be a single [name] and will work full time and some weekends. She has her cousin and sister around for support and reports her sister has been dealing with some PPD and on disability. She is aware of signs and symptoms and LCSW educated on different resources including healthy start in which she was open and receptive to information. She asked questions about daycare and LCSW printed off several documents explaining Daycare, headstart and other programs to support her going back to work.  She inquired about needs if she had bills to pay for needed help with finances in which we discussed [employer] and support groups in community. [name] was appreciative of information. She reports she is ready to DC home and has her sister coming to pick her up. No other needs or concerns at this time.  CSW Plan/Description: Information/Referral to Community Resources , Patient/Family Education , No Further Intervention Required/No Barriers to Discharge (Will follow cord and make CPS report if necessary)    Joshual Terrio N, LCSW 09/05/2015, 11:58 AM   CLINICAL SOCIAL WORK MATERNAL/CHILD NOTE  Patient Details  Name: Darlene Hayes MRN: [id number] Date of Birth: 1985-02-06  Date:  09/05/2015  Clinical Social Worker Initiating Note:  Raye Sorrow, LCSW Date/ Time Initiated:  09/05/15/1156     Child's Name:  Darlene Hayes   Legal Guardian:  Mother   Need for Interpreter:  None   Date of Referral:  09/05/15     Reason for Referral:  Current Substance Use/Substance Use During Pregnancy , Other (Comment) (assessment of needs or community resources)   Referral Source:  RN   Address:     Phone number:      Household Members:  Self   Natural Supports (not living in the home):  Friends, Immediate Family   Professional Supports: None   Employment: Full-time   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  LCSW attempted to meet with [name] on Monday, but family in room. Today she is alone and bonding with baby. She has a bright affect and accepting of assessment. LCSW explained role and reason for consult.  [name] reports prior to pregnancy she did smoke THC, but when she found out she was pregnancy she stopped. She reported and it is documented that she alerted or OB and made it clear her last time smoking was in December.  [name] reports she understands importance of ceasing smoking and has not smoked since and does not plan to continue now that baby has arrived. [name] reports she has not used any other substances. LCSW  notified and educated [name] of hospital policy and no intervention warranted at this time,however if cord is positive then CPS will be contacted and report made. [name] verbalizes understanding.  LCSW assessed any other needs of [name] including home prepared for child, PPD, and support. [name] reports she is going to be a single [name] and will work full time and some weekends. She has her cousin and sister around for support and reports her sister has been dealing with some PPD and on disability. She is aware of signs and symptoms and LCSW educated on different resources including healthy start in which she was open and receptive to information. She asked questions about daycare and LCSW printed off several documents explaining Daycare, headstart and other programs to support her going back to work.  She inquired about needs if she had bills to pay for needed help with finances in which we discussed [employer] and support groups in community. [name] was appreciative of information. She reports she is ready to DC home and has her sister coming to pick her up. No other needs or concerns at this time.  CSW Plan/Description:  Information/Referral to [employer] , [employer] , No Further Intervention Required/No Barriers to Discharge (Will follow cord and make CPS report if necessary)    Raye Sorrow, LCSW 09/05/2015, 11:58 AM

## 2015-09-14 ENCOUNTER — Encounter: Payer: Self-pay | Admitting: *Deleted

## 2015-10-16 ENCOUNTER — Ambulatory Visit: Payer: BLUE CROSS/BLUE SHIELD | Admitting: Obstetrics & Gynecology

## 2015-10-19 ENCOUNTER — Ambulatory Visit (INDEPENDENT_AMBULATORY_CARE_PROVIDER_SITE_OTHER): Payer: BLUE CROSS/BLUE SHIELD | Admitting: Clinical

## 2015-10-19 ENCOUNTER — Ambulatory Visit (INDEPENDENT_AMBULATORY_CARE_PROVIDER_SITE_OTHER): Payer: BLUE CROSS/BLUE SHIELD | Admitting: Obstetrics and Gynecology

## 2015-10-19 ENCOUNTER — Encounter: Payer: Self-pay | Admitting: Obstetrics and Gynecology

## 2015-10-19 DIAGNOSIS — F53 Postpartum depression: Secondary | ICD-10-CM

## 2015-10-19 DIAGNOSIS — F4323 Adjustment disorder with mixed anxiety and depressed mood: Secondary | ICD-10-CM

## 2015-10-19 DIAGNOSIS — O99345 Other mental disorders complicating the puerperium: Principal | ICD-10-CM

## 2015-10-19 DIAGNOSIS — N939 Abnormal uterine and vaginal bleeding, unspecified: Secondary | ICD-10-CM

## 2015-10-19 HISTORY — DX: Postpartum depression: F53.0

## 2015-10-19 NOTE — Progress Notes (Signed)
Post Partum Exam 10/19/2015  [facility]  Darlene Hayes is a 30 y.o. 231P1001 female who presents for a postpartum visit.  She is s/p 8/27 SVD/intact perineum. Her pregnancy was uncomplicated.  I have fully reviewed the prenatal and intrapartum course. Anesthesia: epidural. Baby's course has been unremarkable. Baby is feeding by bottle - Similac Advance. Bleeding no bleeding and red, heavy, every other day. Bowel function is abnormal: Constipation. Bladder function is normal. Patient not sexually active since having the child.  Postpartum depression screening: Positive=Score 22, no SI/HI and patient contracts for safety  [place] in [place] but they aren't helping her out and the FOB isn't supportive, either emotionally or financially. She denies any verbal or domestic abuse from him. She hasn't gone back to work and she states that her finances are getting stressed, and all of the above is causing her a great deal of stress, anxiety and depressive s/s. She does not have a h/o depression.   She has had some AUB that comes on every few days and causes 2-3 pads worth of non saturating bleeding for a day or so. No pain associated with it and patient hasn't had a period yet. She has not received anything for contraception since delivery  Review of Systems Pertinent items are noted in HPI.   Objective:    BP 116/78 mmHg  Pulse 78  Resp 16  Ht 5\' 5"  (1.651 m)  Wt 211 lb (95.709 kg)  BMI 35.11 kg/m2    Patient tearful and emotional when discussing her situation WDWN patient and normally dressed and groomed.   Assessment:   PP depression. Patient stable  Plan:   Long d/w re: her situation and I told her that I believe she'd benefit by seeing Asher MuirJamie [facility] which she is amenable to and can see today at 1300. Can do exam, evaluate AUB further at next visit.   I told her that if she sends in her FMLA paperwork, I can fill it out to  have her out until seen in a week for f/u.   Pap 2017 negative  RTC 1wk for mood check and f/u AUB  Cornelia Copaharlie Verble Styron, Jr MD Attending Center for [facility](Faculty Practice)

## 2015-10-19 NOTE — BH Specialist Note (Signed)
Session Start time: 1:15   End Time: 2:15 Total Time:  60 minutes Type of Service: Behavioral Health - Individual/Family Interpreter: No.   Interpreter Name & Language: n/a # [facility] July 2017-June 2018: 1st   SUBJECTIVE: Darlene Hayes is a 30 y.o. female  Pt. was referred by Manawa Bingharlie Pickens, MD for:  depression. Pt. reports the following symptoms/concerns: Pt states that she has been feeling overwhelmed after the birth of her child, including financial stressors and lack of sleep; copes by listening to music throughout the day and taking daily 10-15 minute walks outside with baby. Duration of problem:  Two months Severity: moderate Previous treatment: none   OBJECTIVE: Mood: Appropriate & Affect: Appropriate Risk of harm to self or others: no known risk of harm to self or others Assessments administered: PHQ9:   LIFE CONTEXT:  Family & Social: No social support  School/ Work: On maternity leave until 12 weeks Self-Care: Daily walks, eating well, insufficient sleep  Life changes: Recent birth What is important to pt/family (values): Son   GOALS ADDRESSED:  -Alleviate symptoms of depression and anxiety to previous level of functioning  INTERVENTIONS: Solution Focused   ASSESSMENT:  Pt currently experiencing Adjustment disorder with mixed anxious and depressed mood.  Pt may benefit from psychoeducation and brief therapeutic intervention regarding coping with symptoms of anxiety and depression.      PLAN: 1. F/U with behavioral health clinician: one month or as needed 2. Behavioral Health meds: none 3. Behavioral recommendations:  -Consider Mom Talk and Baby and Me social support classes at [facility] on Tuesdays(10am) and Thursdays(11am) -Consider practicing daily relaxation breathing, as practiced in office visit -Consider calming apps to cope with anxiety -Consider Worry Hour strategy, to prioritize worries -Read educational material regarding coping  with symptoms of anxiety and depression -Consider [facility] as additional social support 4. Referral: Brief Counseling/Psychotherapy, Publishing rights managerCommunity Resource and Psychoeducation Depression screen [facility] 2/9 10/19/2015  Decreased Interest 2  Down, Depressed, Hopeless 3  PHQ - 2 Score 5  Altered sleeping 3  Tired, decreased energy 2  Change in appetite 2  Feeling bad or failure about yourself  1  Trouble concentrating 1  Moving slowly or fidgety/restless 1  Suicidal thoughts 0  PHQ-9 Score 15   GAD 7 : Generalized Anxiety Score 10/19/2015  Nervous, Anxious, on Edge 3  Control/stop worrying 3  Worry too much - different things 3  Trouble relaxing 2  Restless 2  Easily annoyed or irritable 3  Afraid - awful might happen 3  Total GAD 7 Score 19      Woc-Behavioral Health Clinician  Behavioral Health Clinician  Warmhandoff:   Warm Hand Off Completed.

## 2015-11-01 ENCOUNTER — Encounter: Payer: Self-pay | Admitting: Obstetrics and Gynecology

## 2015-11-01 ENCOUNTER — Ambulatory Visit (INDEPENDENT_AMBULATORY_CARE_PROVIDER_SITE_OTHER): Payer: BLUE CROSS/BLUE SHIELD | Admitting: Obstetrics and Gynecology

## 2015-11-01 VITALS — BP 132/79 | HR 85 | Ht 67.0 in | Wt 209.0 lb

## 2015-11-01 DIAGNOSIS — F53 Postpartum depression: Secondary | ICD-10-CM

## 2015-11-01 DIAGNOSIS — O99345 Other mental disorders complicating the puerperium: Principal | ICD-10-CM

## 2015-11-01 MED ORDER — NORETHIN ACE-ETH ESTRAD-FE 1-20 MG-MCG(24) PO TABS: 1.0000 | ORAL_TABLET | Freq: Every day | ORAL | 11 refills | Status: DC

## 2015-11-01 NOTE — Progress Notes (Signed)
Obstetrics Visit Postpartum Visit  Appointment Date: 11/01/2015  [facility]  Chief Complaint:  Chief Complaint  Patient presents with  . Follow-up    Post partum    History of Present Illness: Darlene Hayes is a 30 y.o. African-American G1P1001 (LMP: ?10/23), seen for the above chief complaint. Her past medical history is significant for PP depression diagnosed at last visit on 10/12   She is s/p SVD/intact perineum on 8/27; she was discharged to home on PPD#2  At her last visit she was having PP depression s/s and strategies were given by me and she was seen by Hulda MarinJamie McMannes, SW for intervention, too. Since then, she states that her mood is better and she is feeling better as well and that her mood continues to improve.   She thinks her period started a few days ago.   Vaginal bleeding or discharge: Yes  Breast or formula feeding: formula Intercourse: No  Contraception after delivery: None Any bowel or bladder issues: No  Pap smear: no abnormalities (date: 2017)  Review of Systems: Her 12 point review of systems is negative or as noted in the History of Present Illness.  Medications None  Allergies Review of patient's allergies indicates no known allergies.  Physical Exam:  BP 132/79   Pulse 85   Ht 5\' 7"  (1.702 m)   Wt 209 lb (94.8 kg)   Breastfeeding? No   BMI 32.73 kg/m  Body mass index is 32.73 kg/m. General appearance: Well nourished, well developed female in no acute distress.  Cardiovascular: normal s1 and s2.  No murmurs, rubs or gallops. Respiratory:  Clear to auscultation bilateral. Normal respiratory effort Abdomen: positive bowel sounds and no masses, hernias; diffusely non tender to palpation, non distended Neuro/Psych:  Normal mood and affect.  Skin:  Warm and dry.  Lymphatic:  No inguinal lymphadenopathy.   Pelvic exam: is not limited by body habitus EGBUS: within normal limits Vagina: within normal  limits and with minimal old blood in the vault, Cervix:  no lesions or cervical motion tenderness and no active bleeding Uterus:  nonenlarged and approximately 8 week sized, nttp Adnexa:  normal adnexa and no mass, fullness, tenderness Rectovaginal: deferred  Laboratory: None  PP Depression Screening:  14  Assessment: Patient doing well  Plan:  *PPD: pt continues to improve and feels ready to go back to work. Patient told that if s/s don't continue to improve to let me know for further interventions *?AUB: pt amenable to starting on OCPs, which would help with any potential AUB, as well as provide BC although she's not currently sexually active. R/b of OCP use d/w her and she's fine for doing OCPs.   RTC PRN  Cornelia Copaharlie Telena Peyser, Jr MD Attending Center for [facility](Faculty Practice)

## 2016-07-22 ENCOUNTER — Emergency Department (HOSPITAL_COMMUNITY)
Admission: EM | Admit: 2016-07-22 | Discharge: 2016-07-22 | Disposition: A | Discharge status: Home / Self Care | Payer: BLUE CROSS/BLUE SHIELD | Attending: Emergency Medicine | Admitting: Emergency Medicine

## 2016-07-22 ENCOUNTER — Encounter (HOSPITAL_COMMUNITY): Payer: Self-pay | Admitting: *Deleted

## 2016-07-22 DIAGNOSIS — T7840XA Allergy, unspecified, initial encounter: Secondary | ICD-10-CM | POA: Diagnosis not present

## 2016-07-22 DIAGNOSIS — Z87891 Personal history of nicotine dependence: Secondary | ICD-10-CM | POA: Insufficient documentation

## 2016-07-22 DIAGNOSIS — Z791 Long term (current) use of non-steroidal anti-inflammatories (NSAID): Secondary | ICD-10-CM | POA: Insufficient documentation

## 2016-07-22 DIAGNOSIS — Z79899 Other long term (current) drug therapy: Secondary | ICD-10-CM | POA: Diagnosis not present

## 2016-07-22 DIAGNOSIS — R21 Rash and other nonspecific skin eruption: Secondary | ICD-10-CM | POA: Diagnosis not present

## 2016-07-22 LAB — CBG MONITORING, ED: GLUCOSE-CAPILLARY: 74 mg/dL (ref 65–99)

## 2016-07-22 MED ORDER — CEPHALEXIN 500 MG PO CAPS: 500.0000 mg | ORAL_CAPSULE | Freq: Three times a day (TID) | ORAL | 0 refills | Status: DC

## 2016-07-22 NOTE — ED Triage Notes (Signed)
To ED for eval of rash to body starting 2 wks ago. Pt states she was seen by pcp and given steriods and topical cream. Not getting any better. Denies new clothes, new furniture, new soaps.

## 2016-07-22 NOTE — Discharge Instructions (Signed)
Take the antibiotic as prescribed. Take Tylenol as instructed for pain. Stand under a warm shower 3 times daily for 30 minutes at a time which should help with rash. If not improving after a week, call Dr. Terri PiedraLupton to schedule an office visit

## 2016-07-22 NOTE — ED Notes (Signed)
PT states understanding of care given, follow up care, and medication prescribed. PT ambulated from ED to car with a steady gait. 

## 2016-07-22 NOTE — ED Provider Notes (Addendum)
[facility]-EMERGENCY DEPT Provider Note   CSN: [id number] Arrival date & time: 07/22/16  1733     History   Chief Complaint Chief Complaint  Patient presents with  . Allergic Reaction    HPI Darlene Hayes is a 31 y.o. female.  HPI Patient with rash on face, trunk and extremities onset 2 weeks ago which was initially itchy and has become painful. Denies fever denies nausea or vomiting. No other associated symptoms. No shortness of breath or voice change or dysphagia She was seen by her primary care physician who treated her with prednisolone taper which she has completed, without relief.She's also tried hydrocortisone ointment without relief No other associated symptoms. No shortness of breath Past Medical History:  Diagnosis Date  . Abnormal Pap smear    abn paps since 2000. no additional testing done.   . Chlamydia 07/2010   treated. hasn't been with that partner since.   . Gonorrhea    treated in July 2012. hasn't been with that partner since.   . Vaginal Pap smear, abnormal     Patient Active Problem List   Diagnosis Date Noted  . Postpartum depression 10/19/2015  . 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 Smoker 10/13/2012  . Obesity, unspecified 10/13/2012    Past Surgical History:  Procedure Laterality Date  . NO PAST SURGERIES      OB History    Gravida Para Term Preterm AB Living   1 1 1  0 0 1   SAB TAB Ectopic Multiple Live Births   0 0 0 0 1       Home Medications    Prior to Admission medications   Medication Sig Start Date End Date Taking? Authorizing Provider  ibuprofen (ADVIL,MOTRIN) 600 MG tablet Take 1 tablet (600 mg total) by mouth every 6 (six) hours. Patient not taking: Reported on 11/01/2015 09/05/15   Leland HerYoo, Elsia J, DO  Misc. Devices (BREAST PUMP) MISC Dispense one electric breast pump for patient Patient not taking: Reported on 11/01/2015 08/22/15   Anyanwu, Jethro BastosUgonna A, MD  Norethindrone Acetate-Ethinyl Estrad-FE (LOESTRIN 24 FE) 1-20 MG-MCG(24)  tablet Take 1 tablet by mouth daily. 11/01/15   Elliott BingPickens, Charlie, MD  nystatin-triamcinolone ointment (MYCOLOG) Apply 1 application topically 3 (three) times daily. Apply to affected area Patient not taking: Reported on 11/01/2015 08/24/15   Anyanwu, Jethro BastosUgonna A, MD  Prenatal Vit-Fe Fumarate-FA (PRENATAL VITAMINS PLUS) 27-1 MG TABS Take 1 tablet by mouth daily. Patient not taking: Reported on 11/01/2015 01/18/15   Glyn Adeeague Clark, Scot JunKaren E, PA-C  senna-docusate (SENOKOT-S) 8.6-50 MG tablet Take 2 tablets by mouth daily. Patient not taking: Reported on 11/01/2015 09/05/15   Leland HerYoo, Elsia J, DO    Family History Family History  Problem Relation Age of Onset  . Hypertension Mother   . Miscarriages / Stillbirths Sister   . Hypertension Sister   . Stroke Maternal Grandmother   . Diabetes Maternal Grandmother     Social History Social History  Substance Use Topics  . Smoking status: Former Smoker    Packs/day: 0.15    Types: Cigarettes    Quit date: 01/01/2015  . Smokeless tobacco: Never Used  . Alcohol use No   Denies drug use  Allergies   Patient has no known allergies.   Review of Systems Review of Systems  Skin: Positive for rash.  All other systems reviewed and are negative.    Physical Exam Updated Vital Signs BP 122/82 (BP Location: Right Arm)   Pulse 80   Temp 98.2  F (36.8 C) (Oral)   Resp 17   SpO2 100%   Physical Exam  Constitutional: She appears well-developed and well-nourished.  HENT:  Head: Normocephalic and atraumatic.  No mucosal lesion  Eyes: Pupils are equal, round, and reactive to light. Conjunctivae are normal.  Neck: Neck supple. No tracheal deviation present. No thyromegaly present.  Cardiovascular: Normal rate and regular rhythm.   No murmur heard. Pulmonary/Chest: Effort normal and breath sounds normal.  Abdominal: Soft. Bowel sounds are normal. She exhibits no distension. There is no tenderness.  Musculoskeletal: Normal range of motion. She  exhibits no edema or tenderness.  Neurological: She is alert. Coordination normal.  Skin: Skin is dry. Rash noted.  There are a few pinpoint pustular lesions on lower extremities with surrounding area of erythema which is approximately 2 cm in diameter. There is also an acneiform type rash on face and on posterior neck. Rash does not involve palms or soles  Psychiatric: She has a normal mood and affect.  Nursing note and vitals reviewed.    ED Treatments / Results  Labs (all labs ordered are listed, but only abnormal results are displayed) Labs Reviewed  CBG MONITORING, ED    EKG  EKG Interpretation None       Radiology No results found.  Procedures Procedures (including critical care time)  Medications Ordered in ED Medications - No data to display   Initial Impression / Assessment and Plan / ED Course  I have reviewed the triage vital signs and the nursing notes.  Pertinent labs & imaging results that were available during my care of the patient were reviewed by me and considered in my medical decision making (see chart for details).    Results for orders placed or performed during the hospital encounter of 07/22/16  CBG monitoring, ED  Result Value Ref Range   Glucose-Capillary 74 65 - 99 mg/dL   No results found. Plan prescription Keflex. Referral Dr. Terri Piedra  Final Clinical Impressions(s) / ED Diagnoses  DxRash Final diagnoses:  None    New Prescriptions New Prescriptions   No medications on file     Doug Sou, MD 07/22/16 2034    Doug Sou, MD 07/22/16 1610    Doug Sou, MD 07/22/16 2039

## 2016-10-14 ENCOUNTER — Encounter (HOSPITAL_COMMUNITY): Payer: Self-pay | Admitting: *Deleted

## 2016-10-14 ENCOUNTER — Inpatient Hospital Stay (HOSPITAL_COMMUNITY)
Admission: AD | Admit: 2016-10-14 | Discharge: 2016-10-14 | Disposition: A | Discharge status: Home / Self Care | Payer: BLUE CROSS/BLUE SHIELD | Source: Ambulatory Visit | Attending: Obstetrics and Gynecology | Admitting: Obstetrics and Gynecology

## 2016-10-14 ENCOUNTER — Inpatient Hospital Stay (HOSPITAL_COMMUNITY): Payer: BLUE CROSS/BLUE SHIELD

## 2016-10-14 DIAGNOSIS — Z3491 Encounter for supervision of normal pregnancy, unspecified, first trimester: Secondary | ICD-10-CM

## 2016-10-14 DIAGNOSIS — Z3A01 Less than 8 weeks gestation of pregnancy: Secondary | ICD-10-CM | POA: Diagnosis not present

## 2016-10-14 DIAGNOSIS — O210 Mild hyperemesis gravidarum: Secondary | ICD-10-CM | POA: Insufficient documentation

## 2016-10-14 DIAGNOSIS — R109 Unspecified abdominal pain: Secondary | ICD-10-CM | POA: Diagnosis present

## 2016-10-14 DIAGNOSIS — Z87891 Personal history of nicotine dependence: Secondary | ICD-10-CM | POA: Insufficient documentation

## 2016-10-14 DIAGNOSIS — O219 Vomiting of pregnancy, unspecified: Secondary | ICD-10-CM

## 2016-10-14 DIAGNOSIS — O26891 Other specified pregnancy related conditions, first trimester: Secondary | ICD-10-CM | POA: Diagnosis not present

## 2016-10-14 LAB — CBC
HCT: 31.7 % — ABNORMAL LOW (ref 36.0–46.0)
Hemoglobin: 10.2 g/dL — ABNORMAL LOW (ref 12.0–15.0)
MCH: 26.2 pg (ref 26.0–34.0)
MCHC: 32.2 g/dL (ref 30.0–36.0)
MCV: 81.5 fL (ref 78.0–100.0)
PLATELETS: 205 10*3/uL (ref 150–400)
RBC: 3.89 MIL/uL (ref 3.87–5.11)
RDW: 17 % — ABNORMAL HIGH (ref 11.5–15.5)
WBC: 4 10*3/uL (ref 4.0–10.5)

## 2016-10-14 LAB — URINALYSIS, ROUTINE W REFLEX MICROSCOPIC
BILIRUBIN URINE: NEGATIVE
GLUCOSE, UA: NEGATIVE mg/dL
HGB URINE DIPSTICK: NEGATIVE
KETONES UR: NEGATIVE mg/dL
LEUKOCYTES UA: NEGATIVE
Nitrite: NEGATIVE
PH: 6.5 (ref 5.0–8.0)
Protein, ur: NEGATIVE mg/dL
Specific Gravity, Urine: 1.015 (ref 1.005–1.030)

## 2016-10-14 LAB — WET PREP, GENITAL
CLUE CELLS WET PREP: NONE SEEN
Sperm: NONE SEEN
Trich, Wet Prep: NONE SEEN
Yeast Wet Prep HPF POC: NONE SEEN

## 2016-10-14 LAB — POCT PREGNANCY, URINE: Preg Test, Ur: POSITIVE — AB

## 2016-10-14 LAB — HCG, QUANTITATIVE, PREGNANCY: hCG, Beta Chain, Quant, S: 77131 m[IU]/mL — ABNORMAL HIGH (ref <5)

## 2016-10-14 MED ORDER — METOCLOPRAMIDE HCL 10 MG PO TABS: 5.0000 mg | ORAL_TABLET | Freq: Three times a day (TID) | ORAL | 2 refills | Status: DC | PRN

## 2016-10-14 NOTE — Progress Notes (Signed)
Chief Complaint: Abdominal Pain   First Provider Initiated Contact with Patient 10/14/16 1051        SUBJECTIVE HPI: Darlene Hayes is a 31 y.o. G2P1001 at [redacted]w[redacted]d by LMP who presents to maternity admissions reporting RLQ abdominal pain and a missed last menstrual period. The symptoms has began and have persisted for the last two weeks. The pain is described as sharp and stabbing, and last for a few seconds at a time. The pt reports that she has experienced pain like this before during her last pregnancy. The pt reports being sexually active with one partner, and denies use of contraception.  She denies vaginal bleeding, vaginal itching/burning, urinary discomfort, h/a, dizziness, n/v, or fever/chills.     HPI  Past Medical History:  Diagnosis Date  . Abnormal Pap smear    abn paps since 2000. no additional testing done.   . Chlamydia 07/2010   treated. hasn't been with that partner since.   . Gonorrhea    treated in July 2012. hasn't been with that partner since.   . Vaginal Pap smear, abnormal    Past Surgical History:  Procedure Laterality Date  . NO PAST SURGERIES     Social History   Social History  . Marital status: Single    Spouse name: N/A  . Number of children: N/A  . Years of education: N/A   Occupational History  . Not on file.   Social History Main Topics  . Smoking status: Former Smoker    Packs/day: 0.15    Types: Cigarettes    Quit date: 01/01/2015  . Smokeless tobacco: Never Used  . Alcohol use No  . Drug use: Yes    Types: Marijuana  . Sexual activity: Not Currently   Other Topics Concern  . Not on file   Social History Narrative  . No narrative on file   No current facility-administered medications on file prior to encounter.    No current outpatient prescriptions on file prior to encounter.   No Known Allergies  I have reviewed patient's Past Medical Hx, Surgical Hx, Family Hx, Social Hx, medications and allergies.   ROS:  Review of  Systems She endorses nausea, fatigue, polyuria, and more frequent defecation (specifically within the last 2 weeks).  Other systems negative   Physical Exam  Physical Exam Patient Vitals for the past 24 hrs:  BP Temp Pulse Resp Height Weight  10/14/16 1006 132/72 98 F (36.7 C) 79 18  (1.676 m) 98.4 kg (217 lb)   Constitutional: Well-developed, well-nourished female in no acute distress.  Cardiovascular: normal rate & rhythm, no m/r/g Respiratory: normal effort GI: Tenderness to deep palpation in the RLQ MS: Extremities nontender, no edema, normal ROM  PELVIC EXAM: Cervix pink, visually closed, without lesion, scant white creamy discharge, vaginal walls and external genitalia normal Bimanual exam: Cervix 0/long/high, firm, anterior, neg CMT, uterus nontender, nonenlarged, adnexa without tenderness, enlargement, or mass   LAB RESULTS Results for orders placed or performed during the hospital encounter of 10/14/16 (from the past 24 hour(s))  Urinalysis, Routine w reflex microscopic     Status: None   Collection Time: 10/14/16 10:02 AM  Result Value Ref Range   Color, Urine YELLOW YELLOW   APPearance CLEAR CLEAR   Specific Gravity, Urine 1.015 1.005 - 1.030   pH 6.5 5.0 - 8.0   Glucose, UA NEGATIVE NEGATIVE mg/dL   Hgb urine dipstick NEGATIVE NEGATIVE   Bilirubin Urine NEGATIVE NEGATIVE   Ketones, ur  NEGATIVE NEGATIVE mg/dL   Protein, ur NEGATIVE NEGATIVE mg/dL   Nitrite NEGATIVE NEGATIVE   Leukocytes, UA NEGATIVE NEGATIVE  Pregnancy, urine POC     Status: Abnormal   Collection Time: 10/14/16 10:24 AM  Result Value Ref Range   Preg Test, Ur POSITIVE (A) NEGATIVE  CBC     Status: Abnormal   Collection Time: 10/14/16 10:56 AM  Result Value Ref Range   WBC 4.0 4.0 - 10.5 K/uL   RBC 3.89 3.87 - 5.11 MIL/uL   Hemoglobin 10.2 (L) 12.0 - 15.0 g/dL   HCT 16.1 (L) 09.6 - 04.5 %   MCV 81.5 78.0 - 100.0 fL   MCH 26.2 26.0 - 34.0 pg   MCHC 32.2 30.0 - 36.0 g/dL   RDW 40.9  (H) 81.1 - 15.5 %   Platelets 205 150 - 400 K/uL  hCG, quantitative, pregnancy     Status: Abnormal   Collection Time: 10/14/16 10:56 AM  Result Value Ref Range   hCG, Beta Chain, Quant, S 77,131 (H) <5 mIU/mL  Wet prep, genital     Status: Abnormal   Collection Time: 10/14/16 11:29 AM  Result Value Ref Range   Yeast Wet Prep HPF POC NONE SEEN NONE SEEN   Trich, Wet Prep NONE SEEN NONE SEEN   Clue Cells Wet Prep HPF POC NONE SEEN NONE SEEN   WBC, Wet Prep HPF POC MODERATE (A) NONE SEEN   Sperm NONE SEEN        IMAGING US Ob Comp Less 14 Wks  Result Date: 10/14/2016 CLINICAL DATA:  31 year old pregnant female with 2 weeks of abdominal pain. Quantitative beta HCG level is pending. EDC by LMP: 06/06/2017, projecting to an expected gestational age of [redacted] weeks 3 days. EXAM: OBSTETRIC <14 WK [place] AND TRANSVAGINAL OB US TECHNIQUE: Both transabdominal and transvaginal ultrasound examinations were performed for complete evaluation of the gestation as well as the maternal uterus, adnexal regions, and pelvic cul-de-sac. Transvaginal technique was performed to assess early pregnancy. COMPARISON:  No prior scans from this gestation. FINDINGS: Intrauterine gestational sac: Single intrauterine gestational sac appears normal in size, shape and position. Yolk sac:  Visualized. Embryo:  Visualized. Embryonic Cardiac Activity: Visualized. Embryonic Heart Rate: 111  bpm CRL:  5.7  mm   6 w   2 d                  [place] EDC: 06/07/2017 Subchorionic hemorrhage:  None visualized. Maternal uterus/adnexae: Anteverted uterus with no uterine fibroids. Left ovary measures 3.5 x 2.1 x 1.6 cm and contains a corpus luteal cyst. Right ovary measures 2.4 x 1.6 x 1.4 cm. No suspicious ovarian or adnexal masses. No abnormal free fluid in the pelvis. IMPRESSION: 1. Single living intrauterine gestation at 6 weeks 2 days by crown-rump length, concordant with provided menstrual dating. 2. No acute early first-trimester gestational  abnormality. Embryonic heart rate 111 bpm, low normal at this early gestational age. 3. No ovarian or adnexal abnormality. Electronically Signed   By: Delbert Phenix M.D.   On: 10/14/2016 12:17   US Ob Transvaginal  Result Date: 10/14/2016 CLINICAL DATA:  31 year old pregnant female with 2 weeks of abdominal pain. Quantitative beta HCG level is pending. EDC by LMP: 06/06/2017, projecting to an expected gestational age of [redacted] weeks 3 days. EXAM: OBSTETRIC <14 WK [place] AND TRANSVAGINAL OB US TECHNIQUE: Both transabdominal and transvaginal ultrasound examinations were performed for complete evaluation of the gestation as well as the maternal uterus, adnexal regions,  and pelvic cul-de-sac. Transvaginal technique was performed to assess early pregnancy. COMPARISON:  No prior scans from this gestation. FINDINGS: Intrauterine gestational sac: Single intrauterine gestational sac appears normal in size, shape and position. Yolk sac:  Visualized. Embryo:  Visualized. Embryonic Cardiac Activity: Visualized. Embryonic Heart Rate: 111  bpm CRL:  5.7  mm   6 w   2 d                  [place] EDC: 06/07/2017 Subchorionic hemorrhage:  None visualized. Maternal uterus/adnexae: Anteverted uterus with no uterine fibroids. Left ovary measures 3.5 x 2.1 x 1.6 cm and contains a corpus luteal cyst. Right ovary measures 2.4 x 1.6 x 1.4 cm. No suspicious ovarian or adnexal masses. No abnormal free fluid in the pelvis. IMPRESSION: 1. Single living intrauterine gestation at 6 weeks 2 days by crown-rump length, concordant with provided menstrual dating. 2. No acute early first-trimester gestational abnormality. Embryonic heart rate 111 bpm, low normal at this early gestational age. 3. No ovarian or adnexal abnormality. Electronically Signed   By: Delbert Phenix M.D.   On: 10/14/2016 12:17    MAU Management/MDM: Ordered usual first trimester r/o ectopic labs.   Pelvic exam and cultures done Will check baseline Ultrasound to rule out  ectopic.  Cultures were done to rule out pelvic infection Blood drawn for Quant HCG, CBC, ABO/Rh  Treatments in MAU included ultrasound.   This pain can represent a normal pregnancy with bleeding, spontaneous abortion or even an ectopic which can be life-threatening.  The process as listed above helps to determine which of these is present.    ASSESSMENT 1. Nausea and vomiting during pregnancy prior to [redacted] weeks gestation   2. Abdominal pain during pregnancy in first trimester   3. Normal IUP (intrauterine pregnancy) on prenatal ultrasound, first trimester   Pt is in stable condition, with a IUP with a gestational age of [redacted] weeks and 2 days.   PLAN Discharge home Plan for initiation of prenatal care within the next few weeks.   Follow-up Information    Prenatal provider of your choice Follow up.   Why:  See list provided. Return to MAU as needed for emergencies.         Pt stable at time of discharge. Encouraged to return here or to other Urgent Care/ED if she develops worsening of symptoms, increase in pain, fever, or other concerning symptoms.    Josephine Igo 10/14/2016  1:05 PM   I confirm that I have verified the information documented in the medical student's note and that I have also personally performed the physical exam and all medical decision making activities.See my complete separate note.  Sharen Counter, CNM 8:48 PM

## 2016-10-14 NOTE — MAU Provider Note (Signed)
Chief Complaint: Abdominal Pain   First Provider Initiated Contact with Patient 10/14/16 1051      SUBJECTIVE HPI: Darlene Hayes is a 31 y.o. G2P1001 at [redacted]w[redacted]d by LMP who presents to maternity admissions reporting intermittent right lower abdominal pain.  Her pain started 2 weeks ago, is sharp pain that is intermittent, unchanged in intensity since onset, and not worsened by eating or position change. She reports nausea x 2 weeks but no other associated symptoms. She has not tried any treatments. She denies vaginal bleeding, vaginal itching/burning, urinary symptoms, h/a, dizziness, n/v, or fever/chills.     HPI  Past Medical History:  Diagnosis Date  . Abnormal Pap smear    abn paps since 2000. no additional testing done.   . Chlamydia 07/2010   treated. hasn't been with that partner since.   . Gonorrhea    treated in July 2012. hasn't been with that partner since.   . Vaginal Pap smear, abnormal    Past Surgical History:  Procedure Laterality Date  . NO PAST SURGERIES     Social History   Social History  . Marital status: Single    Spouse name: N/A  . Number of children: N/A  . Years of education: N/A   Occupational History  . Not on file.   Social History Main Topics  . Smoking status: Former Smoker    Packs/day: 0.15    Types: Cigarettes    Quit date: 01/01/2015  . Smokeless tobacco: Never Used  . Alcohol use No  . Drug use: Yes    Types: Marijuana  . Sexual activity: Not Currently   Other Topics Concern  . Not on file   Social History Narrative  . No narrative on file   No current facility-administered medications on file prior to encounter.    No current outpatient prescriptions on file prior to encounter.   No Known Allergies  ROS:  Review of Systems  Constitutional: Negative for chills, fatigue and fever.  Respiratory: Negative for shortness of breath.   Cardiovascular: Negative for chest pain.  Gastrointestinal: Positive for abdominal pain  and nausea. Negative for constipation and vomiting.  Genitourinary: Positive for pelvic pain. Negative for difficulty urinating, dysuria, flank pain, vaginal bleeding, vaginal discharge and vaginal pain.  Neurological: Negative for dizziness and headaches.  Psychiatric/Behavioral: Negative.      I have reviewed patient's Past Medical Hx, Surgical Hx, Family Hx, Social Hx, medications and allergies.   Physical Exam   Patient Vitals for the past 24 hrs:  BP Temp Pulse Resp SpO2 Height Weight  10/14/16 1333 - - 74 - (!) 16 % - -  10/14/16 1006 132/72 98 F (36.7 C) 79 18 -  (1.676 m) 217 lb (98.4 kg)   Constitutional: Well-developed, well-nourished female in no acute distress.  Cardiovascular: normal rate Respiratory: normal effort GI: Abd soft, non-tender. Pos BS x 4 MS: Extremities nontender, no edema, normal ROM Neurologic: Alert and oriented x 4.  GU: Neg CVAT.  PELVIC EXAM: Cervix pink, visually closed, without lesion, scant white creamy discharge, vaginal walls and external genitalia normal Bimanual exam: Cervix 0/long/high, firm, anterior, neg CMT, uterus nontender, nonenlarged, adnexa without tenderness, enlargement, or mass   LAB RESULTS Results for orders placed or performed during the hospital encounter of 10/14/16 (from the past 24 hour(s))  Urinalysis, Routine w reflex microscopic     Status: None   Collection Time: 10/14/16 10:02 AM  Result Value Ref Range   Color, Urine YELLOW  YELLOW   APPearance CLEAR CLEAR   Specific Gravity, Urine 1.015 1.005 - 1.030   pH 6.5 5.0 - 8.0   Glucose, UA NEGATIVE NEGATIVE mg/dL   Hgb urine dipstick NEGATIVE NEGATIVE   Bilirubin Urine NEGATIVE NEGATIVE   Ketones, ur NEGATIVE NEGATIVE mg/dL   Protein, ur NEGATIVE NEGATIVE mg/dL   Nitrite NEGATIVE NEGATIVE   Leukocytes, UA NEGATIVE NEGATIVE  Pregnancy, urine POC     Status: Abnormal   Collection Time: 10/14/16 10:24 AM  Result Value Ref Range   Preg Test, Ur POSITIVE (A)  NEGATIVE  CBC     Status: Abnormal   Collection Time: 10/14/16 10:56 AM  Result Value Ref Range   WBC 4.0 4.0 - 10.5 K/uL   RBC 3.89 3.87 - 5.11 MIL/uL   Hemoglobin 10.2 (L) 12.0 - 15.0 g/dL   HCT 40.9 (L) 81.1 - 91.4 %   MCV 81.5 78.0 - 100.0 fL   MCH 26.2 26.0 - 34.0 pg   MCHC 32.2 30.0 - 36.0 g/dL   RDW 78.2 (H) 95.6 - 21.3 %   Platelets 205 150 - 400 K/uL  hCG, quantitative, pregnancy     Status: Abnormal   Collection Time: 10/14/16 10:56 AM  Result Value Ref Range   hCG, Beta Chain, Quant, S 77,131 (H) <5 mIU/mL  Wet prep, genital     Status: Abnormal   Collection Time: 10/14/16 11:29 AM  Result Value Ref Range   Yeast Wet Prep HPF POC NONE SEEN NONE SEEN   Trich, Wet Prep NONE SEEN NONE SEEN   Clue Cells Wet Prep HPF POC NONE SEEN NONE SEEN   WBC, Wet Prep HPF POC MODERATE (A) NONE SEEN   Sperm NONE SEEN        IMAGING US Ob Comp Less 14 Wks  Result Date: 10/14/2016 CLINICAL DATA:  31 year old pregnant female with 2 weeks of abdominal pain. Quantitative beta HCG level is pending. EDC by LMP: 06/06/2017, projecting to an expected gestational age of [redacted] weeks 3 days. EXAM: OBSTETRIC <14 WK [place] AND TRANSVAGINAL OB US TECHNIQUE: Both transabdominal and transvaginal ultrasound examinations were performed for complete evaluation of the gestation as well as the maternal uterus, adnexal regions, and pelvic cul-de-sac. Transvaginal technique was performed to assess early pregnancy. COMPARISON:  No prior scans from this gestation. FINDINGS: Intrauterine gestational sac: Single intrauterine gestational sac appears normal in size, shape and position. Yolk sac:  Visualized. Embryo:  Visualized. Embryonic Cardiac Activity: Visualized. Embryonic Heart Rate: 111  bpm CRL:  5.7  mm   6 w   2 d                  [place] EDC: 06/07/2017 Subchorionic hemorrhage:  None visualized. Maternal uterus/adnexae: Anteverted uterus with no uterine fibroids. Left ovary measures 3.5 x 2.1 x 1.6 cm and contains a corpus  luteal cyst. Right ovary measures 2.4 x 1.6 x 1.4 cm. No suspicious ovarian or adnexal masses. No abnormal free fluid in the pelvis. IMPRESSION: 1. Single living intrauterine gestation at 6 weeks 2 days by crown-rump length, concordant with provided menstrual dating. 2. No acute early first-trimester gestational abnormality. Embryonic heart rate 111 bpm, low normal at this early gestational age. 3. No ovarian or adnexal abnormality. Electronically Signed   By: Delbert Phenix M.D.   On: 10/14/2016 12:17   US Ob Transvaginal  Result Date: 10/14/2016 CLINICAL DATA:  31 year old pregnant female with 2 weeks of abdominal pain. Quantitative beta HCG level is pending. EDC  by LMP: 06/06/2017, projecting to an expected gestational age of [redacted] weeks 3 days. EXAM: OBSTETRIC <14 WK [place] AND TRANSVAGINAL OB US TECHNIQUE: Both transabdominal and transvaginal ultrasound examinations were performed for complete evaluation of the gestation as well as the maternal uterus, adnexal regions, and pelvic cul-de-sac. Transvaginal technique was performed to assess early pregnancy. COMPARISON:  No prior scans from this gestation. FINDINGS: Intrauterine gestational sac: Single intrauterine gestational sac appears normal in size, shape and position. Yolk sac:  Visualized. Embryo:  Visualized. Embryonic Cardiac Activity: Visualized. Embryonic Heart Rate: 111  bpm CRL:  5.7  mm   6 w   2 d                  [place] EDC: 06/07/2017 Subchorionic hemorrhage:  None visualized. Maternal uterus/adnexae: Anteverted uterus with no uterine fibroids. Left ovary measures 3.5 x 2.1 x 1.6 cm and contains a corpus luteal cyst. Right ovary measures 2.4 x 1.6 x 1.4 cm. No suspicious ovarian or adnexal masses. No abnormal free fluid in the pelvis. IMPRESSION: 1. Single living intrauterine gestation at 6 weeks 2 days by crown-rump length, concordant with provided menstrual dating. 2. No acute early first-trimester gestational abnormality. Embryonic heart rate 111 bpm, low  normal at this early gestational age. 3. No ovarian or adnexal abnormality. Electronically Signed   By: Delbert Phenix M.D.   On: 10/14/2016 12:17    MAU Management/MDM: Ordered labs and reviewed results.  IUP noted on today's ultrasound. No acute abdomen or other emergency concerns.  Pt to start prenatal care as soon as possible. 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  Safe OTC medications in pregnancy given.  First trimester precautions reviewed.  Pt stable at time of discharge.  ASSESSMENT 1. Nausea and vomiting during pregnancy prior to [redacted] weeks gestation   2. Abdominal pain during pregnancy in first trimester   3. Normal IUP (intrauterine pregnancy) on prenatal ultrasound, first trimester     PLAN Discharge home Allergies as of 10/14/2016   No Known Allergies     Medication List    TAKE these medications   metoCLOPramide 10 MG tablet Commonly known as:  REGLAN Take 0.5-1 tablets (5-10 mg total) by mouth 3 (three) times daily as needed for nausea.      Follow-up Information    Prenatal provider of your choice Follow up.   Why:  See list provided. Return to MAU as needed for emergencies.          Sharen Counter Certified Nurse-Midwife 10/14/2016  8:53 PM

## 2016-10-14 NOTE — Discharge Instructions (Signed)
[facility]  ° ° °[facility]       Phone: [phone] ° °[facility] Phone: [phone] ° °[facility]  Phone: [phone] ° °[facility] [place] [facility]  Phone: [phone] ° °[facility]  Phone: [phone] ° °[facility]       Phone: [phone] ° °[facility]    Phone: [phone]  ° °[facility]    Phone: [phone] ° °[facility]    Phone: [phone] ° °[facility]    Phone: [phone] ° °[facility]    Phone: [phone] ° °[facility]       Phone: [phone]  ° °[facility]  Phone: [phone] ° °[facility]    Phone: [phone] ° °[facility]   Phone: [phone] ° °[facility]      Phone: [phone] ° °[facility]    Phone: [phone] ° °Safe Medications in Pregnancy  ° °Acne: °Benzoyl Peroxide °Salicylic Acid ° °Backache/Headache: °Tylenol: 2 regular strength every 4 hours OR °             2 Extra strength every 6 hours ° °Colds/Coughs/Allergies: °Benadryl (alcohol free) 25 mg every 6 hours as needed °Breath right strips °Claritin °Cepacol throat lozenges °Chloraseptic throat spray °Cold-Eeze- up to three times per day °Cough drops, alcohol free °Flonase (by prescription only) °Guaifenesin °Mucinex °Robitussin DM (plain only, alcohol free) °Saline nasal spray/drops °Sudafed (pseudoephedrine) & Actifed ** use only after [redacted] weeks gestation and if you do not have high blood pressure °Tylenol °Vicks Vaporub °Zinc lozenges °Zyrtec  ° °Constipation: °Colace °Ducolax suppositories °Fleet enema °Glycerin suppositories °Metamucil °Milk of  magnesia °Miralax °Senokot °Smooth move tea ° °Diarrhea: °Kaopectate °Imodium A-D ° °*NO pepto Bismol ° °Hemorrhoids: °Anusol °Anusol HC °Preparation H °Tucks ° °Indigestion: °Tums °Maalox °Mylanta °Zantac  °Pepcid ° °Insomnia: °Benadryl (alcohol free) 25mg every 6 hours as needed °Tylenol PM °Unisom, no Gelcaps ° °Leg Cramps: °Tums °MagGel ° °Nausea/Vomiting:  °Bonine °Dramamine °Emetrol °Ginger extract °Sea bands °Meclizine  °Nausea medication to take during pregnancy:  °Unisom (doxylamine succinate 25 mg tablets) Take one tablet daily at bedtime. If symptoms are not adequately controlled, the dose can be increased to a maximum recommended dose of two tablets daily (1/2 tablet in the morning, 1/2 tablet mid-afternoon and one at bedtime). °Vitamin B6 100mg tablets. Take one tablet twice a day (up to 200 mg per day). ° °Skin Rashes: °Aveeno products °Benadryl cream or 25mg every 6 hours as needed °Calamine Lotion °1% cortisone cream ° °Yeast infection: °Gyne-lotrimin 7 °Monistat 7 ° ° °**If taking multiple medications, please check labels to avoid duplicating the same active ingredients °**take medication as directed on the label °** Do not exceed 4000 mg of tylenol in 24 hours °**Do not take medications that contain aspirin or ibuprofen ° ° ° ° °

## 2016-10-14 NOTE — MAU Note (Signed)
Pt presents to MAU with complaints of lower abdominal pain for 2 weeks, LMP 08/30/16

## 2016-10-15 LAB — GC/CHLAMYDIA PROBE AMP (~~LOC~~) NOT AT ARMC
CHLAMYDIA, DNA PROBE: NEGATIVE
Neisseria Gonorrhea: NEGATIVE

## 2016-10-15 LAB — HIV ANTIBODY (ROUTINE TESTING W REFLEX): HIV Screen 4th Generation wRfx: NONREACTIVE

## 2016-12-07 HISTORY — PX: DILATION AND CURETTAGE OF UTERUS: SHX78

## 2016-12-13 ENCOUNTER — Encounter (HOSPITAL_COMMUNITY): Payer: Self-pay | Admitting: *Deleted

## 2016-12-13 ENCOUNTER — Inpatient Hospital Stay (HOSPITAL_COMMUNITY)
Admission: AD | Admit: 2016-12-13 | Discharge: 2016-12-13 | Disposition: A | Discharge status: Home / Self Care | Payer: BLUE CROSS/BLUE SHIELD | Source: Ambulatory Visit | Attending: Family Medicine | Admitting: Family Medicine

## 2016-12-13 ENCOUNTER — Other Ambulatory Visit: Payer: Self-pay

## 2016-12-13 DIAGNOSIS — O9989 Other specified diseases and conditions complicating pregnancy, childbirth and the puerperium: Secondary | ICD-10-CM

## 2016-12-13 DIAGNOSIS — Z3A15 15 weeks gestation of pregnancy: Secondary | ICD-10-CM | POA: Diagnosis not present

## 2016-12-13 DIAGNOSIS — O99891 Other specified diseases and conditions complicating pregnancy: Secondary | ICD-10-CM

## 2016-12-13 DIAGNOSIS — O26892 Other specified pregnancy related conditions, second trimester: Secondary | ICD-10-CM | POA: Diagnosis not present

## 2016-12-13 DIAGNOSIS — M549 Dorsalgia, unspecified: Secondary | ICD-10-CM | POA: Diagnosis not present

## 2016-12-13 DIAGNOSIS — R109 Unspecified abdominal pain: Secondary | ICD-10-CM

## 2016-12-13 DIAGNOSIS — R102 Pelvic and perineal pain: Secondary | ICD-10-CM

## 2016-12-13 DIAGNOSIS — Z87891 Personal history of nicotine dependence: Secondary | ICD-10-CM | POA: Diagnosis not present

## 2016-12-13 DIAGNOSIS — O26899 Other specified pregnancy related conditions, unspecified trimester: Secondary | ICD-10-CM

## 2016-12-13 LAB — URINALYSIS, ROUTINE W REFLEX MICROSCOPIC
BILIRUBIN URINE: NEGATIVE
GLUCOSE, UA: NEGATIVE mg/dL
HGB URINE DIPSTICK: NEGATIVE
KETONES UR: 5 mg/dL — AB
LEUKOCYTES UA: NEGATIVE
NITRITE: NEGATIVE
PH: 6 (ref 5.0–8.0)
Protein, ur: 30 mg/dL — AB
Specific Gravity, Urine: 1.026 (ref 1.005–1.030)

## 2016-12-13 LAB — WET PREP, GENITAL
CLUE CELLS WET PREP: NONE SEEN
SPERM: NONE SEEN
TRICH WET PREP: NONE SEEN
YEAST WET PREP: NONE SEEN

## 2016-12-13 LAB — POCT PREGNANCY, URINE: Preg Test, Ur: POSITIVE — AB

## 2016-12-13 NOTE — Discharge Instructions (Signed)
[facility]    [facility]       Phone: [phone]  [facility] [place] Phone: [phone]  [facility] [place]  Phone: [phone]  [facility] [place]  Phone: [phone]  [facility] [place]  Phone: [phone]  [facility]       Phone: [phone]  [facility]    Phone: [phone]   [facility] [place]    Phone: [phone]  [facility]    Phone: [phone]  [facility]    Phone: [phone]  [facility]    Phone: [phone]  [facility]       Phone: [phone]   [facility]  Phone: [phone]  [facility]    Phone: [phone]  [facility]   Phone: [phone]  [facility]      Phone: [phone]  [facility]    Phone: [phone]  Safe Medications in Pregnancy   Acne: Benzoyl Peroxide Salicylic Acid  Backache/Headache: Tylenol: 2 regular strength every 4 hours OR              2 Extra strength every 6 hours  Colds/Coughs/Allergies: Benadryl (alcohol free) 25 mg every 6 hours as needed Breath right strips Claritin Cepacol throat lozenges Chloraseptic throat spray Cold-Eeze- up to three times per day Cough drops, alcohol free Flonase (by prescription only) Guaifenesin Mucinex Robitussin DM (plain only, alcohol free) Saline nasal spray/drops Sudafed (pseudoephedrine) & Actifed ** use only after [redacted] weeks gestation and if you do not have high blood pressure Tylenol Vicks Vaporub Zinc lozenges Zyrtec   Constipation: Colace Ducolax suppositories Fleet enema Glycerin suppositories Metamucil Milk of  magnesia Miralax Senokot Smooth move tea  Diarrhea: Kaopectate Imodium A-D  *NO pepto Bismol  Hemorrhoids: Anusol Anusol HC Preparation H Tucks  Indigestion: Tums Maalox Mylanta Zantac  Pepcid  Insomnia: Benadryl (alcohol free) 25mg  every 6 hours as needed Tylenol PM Unisom, no Gelcaps  Leg Cramps: Tums MagGel  Nausea/Vomiting:  Bonine Dramamine Emetrol Ginger extract Sea bands Meclizine  Nausea medication to take during pregnancy:  Unisom (doxylamine succinate 25 mg tablets) Take one tablet daily at bedtime. If symptoms are not adequately controlled, the dose can be increased to a maximum recommended dose of two tablets daily (1/2 tablet in the morning, 1/2 tablet mid-afternoon and one at bedtime). Vitamin B6 100mg  tablets. Take one tablet twice a day (up to 200 mg per day).  Skin Rashes: Aveeno products Benadryl cream or 25mg  every 6 hours as needed Calamine Lotion 1% cortisone cream  Yeast infection: Gyne-lotrimin 7 Monistat 7   **If taking multiple medications, please check labels to avoid duplicating the same active ingredients **take medication as directed on the label ** Do not exceed 4000 mg of tylenol in 24 hours **Do not take medications that contain aspirin or ibuprofen    Round Ligament Pain The round ligament is a cord of muscle and tissue that helps to support the uterus. It can become a source of pain during pregnancy if it becomes stretched or twisted as the baby grows. The pain usually begins in the second trimester of pregnancy, and it can come and go until the baby is delivered. It is not a serious problem, and it does not cause harm to the baby. Round ligament pain is  usually a short, sharp, and pinching pain, but it can also be a dull, lingering, and aching pain. The pain is felt in the lower side of the abdomen or in the groin. It usually starts deep in the groin and moves up to the outside of the hip area. Pain can occur  with:  A sudden change in position.  Rolling over in bed.  Coughing or sneezing.  Physical activity.  Follow these instructions at home: Watch your condition for any changes. Take these steps to help with your pain:  When the pain starts, relax. Then try: ? Sitting down. ? Flexing your knees up to your abdomen. ? Lying on your side with one pillow under your abdomen and another pillow between your legs. ? Sitting in a warm bath for 15-20 minutes or until the pain goes away.  Take over-the-counter and prescription medicines only as told by your health care provider.  Move slowly when you sit and stand.  Avoid long walks if they cause pain.  Stop or lessen your physical activities if they cause pain.  Contact a health care provider if:  Your pain does not go away with treatment.  You feel pain in your back that you did not have before.  Your medicine is not helping. Get help right away if:  You develop a fever or chills.  You develop uterine contractions.  You develop vaginal bleeding.  You develop nausea or vomiting.  You develop diarrhea.  You have pain when you urinate. This information is not intended to replace advice given to you by your health care provider. Make sure you discuss any questions you have with your health care provider. Document Released: 10/03/2007 Document Revised: 06/01/2015 Document Reviewed: 03/02/2014 Elsevier Interactive Patient Education  [employer]2018 Elsevier Inc.

## 2016-12-13 NOTE — MAU Provider Note (Signed)
History     CSN: [id number]  Arrival date and time: 12/13/16 [id number]   First Provider Initiated Contact with Patient 12/13/16 1016     Chief Complaint  Patient presents with  . Pelvic Pain  . Back Pain   HPI Darlene Hayes is a 31 y.o. G2P1001 at 3234w0d who presents with lower abdominal pain and back pain. She states the pain started a week ago and has gradually gotten worse. She describes the pain as cramping and rates it a 8/10 when it happens. She denies any current pain. She denies any vaginal bleeding, discharge or leaking of fluid. She had a confirmed IUP in October and has not started prenatal care yet.   OB History    Gravida Para Term Preterm AB Living   2 1 1  0 0 1   SAB TAB Ectopic Multiple Live Births   0 0 0 0 1      Past Medical History:  Diagnosis Date  . Abnormal Pap smear    abn paps since 2000. no additional testing done.   . Chlamydia 07/2010   treated. hasn't been with that partner since.   . Gonorrhea    treated in July 2012. hasn't been with that partner since.   . Vaginal Pap smear, abnormal     Past Surgical History:  Procedure Laterality Date  . NO PAST SURGERIES      Family History  Problem Relation Age of Onset  . Hypertension Mother   . Miscarriages / Stillbirths Sister   . Hypertension Sister   . Stroke Maternal Grandmother   . Diabetes Maternal Grandmother     Social History   Tobacco Use  . Smoking status: Former Smoker    Packs/day: 0.15    Types: Cigarettes    Last attempt to quit: 01/01/2015    Years since quitting: 1.9  . Smokeless tobacco: Never Used  Substance Use Topics  . Alcohol use: No  . Drug use: No    Allergies: No Known Allergies  Medications Prior to Admission  Medication Sig Dispense Refill Last Dose  . metoCLOPramide (REGLAN) 10 MG tablet Take 0.5-1 tablets (5-10 mg total) by mouth 3 (three) times daily as needed for nausea. 60 tablet 2 Past Month at Unknown time  . Prenatal MV & Min w/FA-DHA  (PRENATAL ADULT GUMMY/DHA/FA) 0.4-25 MG CHEW Chew 2 tablets by mouth daily.   Past Week at Unknown time    Review of Systems  Constitutional: Negative.  Negative for fatigue and fever.  HENT: Negative.   Respiratory: Negative.  Negative for shortness of breath.   Cardiovascular: Negative.  Negative for chest pain.  Gastrointestinal: Positive for abdominal pain. Negative for constipation, diarrhea, nausea and vomiting.  Genitourinary: Negative.  Negative for dysuria, vaginal bleeding and vaginal discharge.  Musculoskeletal: Positive for back pain.  Neurological: Negative.  Negative for dizziness and headaches.   Physical Exam   Blood pressure 122/72, pulse 93, temperature 98.3 F (36.8 C), temperature source Oral, resp. rate 18, height 5\' 6"  (1.676 m), weight 203 lb (92.1 kg), last menstrual period 08/30/2016, SpO2 100 %, not currently breastfeeding.  Physical Exam  Nursing note and vitals reviewed. Constitutional: She is oriented to person, place, and time. She appears well-developed and well-nourished. No distress.  HENT:  Head: Normocephalic.  Eyes: Pupils are equal, round, and reactive to light.  Cardiovascular: Normal rate, regular rhythm and normal heart sounds.  Respiratory: Effort normal and breath sounds normal. No respiratory distress.  GI: Soft. Bowel  sounds are normal. She exhibits no distension. There is no tenderness. There is no rebound and no guarding.  Neurological: She is alert and oriented to person, place, and time.  Skin: Skin is warm and dry.  Psychiatric: She has a normal mood and affect. Her behavior is normal. Judgment and thought content normal.   Pelvic exam: Cervix pink, visually closed, without lesion, scant white creamy discharge, vaginal walls and external genitalia normal Bimanual exam: Cervix 0/long/high, firm, anterior, neg CMT, uterus nontender, adnexa without tenderness, enlargement, or mass  Dilation: Closed Effacement (%): Thick Cervical  Position: Posterior Presentation: Undeterminable Exam by:: Rayfield Citizenaroline, CNM   MAU Course  Procedures Results for orders placed or performed during the hospital encounter of 12/13/16 (from the past 24 hour(s))  Urinalysis, Routine w reflex microscopic     Status: Abnormal   Collection Time: 12/13/16  9:39 AM  Result Value Ref Range   Color, Urine YELLOW YELLOW   APPearance HAZY (A) CLEAR   Specific Gravity, Urine 1.026 1.005 - 1.030   pH 6.0 5.0 - 8.0   Glucose, UA NEGATIVE NEGATIVE mg/dL   Hgb urine dipstick NEGATIVE NEGATIVE   Bilirubin Urine NEGATIVE NEGATIVE   Ketones, ur 5 (A) NEGATIVE mg/dL   Protein, ur 30 (A) NEGATIVE mg/dL   Nitrite NEGATIVE NEGATIVE   Leukocytes, UA NEGATIVE NEGATIVE   RBC / HPF 0-5 0 - 5 RBC/hpf   WBC, UA 0-5 0 - 5 WBC/hpf   Bacteria, UA MANY (A) NONE SEEN   Squamous Epithelial / LPF 6-30 (A) NONE SEEN   Mucus PRESENT   Pregnancy, urine POC     Status: Abnormal   Collection Time: 12/13/16  9:51 AM  Result Value Ref Range   Preg Test, Ur POSITIVE (A) NEGATIVE  Wet prep, genital     Status: Abnormal   Collection Time: 12/13/16 10:25 AM  Result Value Ref Range   Yeast Wet Prep HPF POC NONE SEEN NONE SEEN   Trich, Wet Prep NONE SEEN NONE SEEN   Clue Cells Wet Prep HPF POC NONE SEEN NONE SEEN   WBC, Wet Prep HPF POC FEW (A) NONE SEEN   Sperm NONE SEEN    MDM UA Wet prep and gc/chlamydia Low suspicion for appendicitis due to location of pain and absence of fever, leukocytosis and GI complaints.  Assessment and Plan   1. Pain of round ligament during pregnancy   2. Abdominal pain affecting pregnancy   3. Back pain affecting pregnancy in second trimester    -Discharge home in stable condition -Encouraged patient to use tylenol PRN for pain -Reviewed pregnancy support belt and comfort measures for round ligament pain -Patient advised to follow-up with OB provider of choice to start prenatal care ASAP, list given. -Patient may return to MAU as  needed or if her condition were to change or worsen  Rolm BookbinderCaroline M Arlana Canizales CNM 12/13/2016, 10:50 AM

## 2016-12-13 NOTE — MAU Note (Signed)
Back pain and abd. Cramping started about a week and a half ago.  Pt. Here due to increased pain.  Denies vaginal bleeding. Pt. Stated she could be pregnant due to a home pregnancy test she took a while ago, based on patient's information,  Urine Pregnancy test completed, positive.  RN  to doppler patient, FHR 145 bpm, lower left quad.

## 2016-12-13 NOTE — MAU Note (Addendum)
[facility].  Discharge instructions reviewed with patient, patient verbalized understanding.  Patient signed copy, copy sent to medical records.  E-signature disconnected - nurse unable to operate signature pad.  IT notified.

## 2016-12-14 LAB — GC/CHLAMYDIA PROBE AMP (~~LOC~~) NOT AT ARMC
CHLAMYDIA, DNA PROBE: NEGATIVE
NEISSERIA GONORRHEA: NEGATIVE

## 2017-01-16 ENCOUNTER — Encounter: Payer: Self-pay | Admitting: Obstetrics and Gynecology

## 2017-01-16 ENCOUNTER — Ambulatory Visit (INDEPENDENT_AMBULATORY_CARE_PROVIDER_SITE_OTHER): Payer: BLUE CROSS/BLUE SHIELD | Admitting: Obstetrics and Gynecology

## 2017-01-16 VITALS — BP 124/70 | HR 86 | Wt 214.0 lb

## 2017-01-16 DIAGNOSIS — E669 Obesity, unspecified: Secondary | ICD-10-CM | POA: Insufficient documentation

## 2017-01-16 DIAGNOSIS — Z01419 Encounter for gynecological examination (general) (routine) without abnormal findings: Secondary | ICD-10-CM | POA: Diagnosis not present

## 2017-01-16 DIAGNOSIS — Z113 Encounter for screening for infections with a predominantly sexual mode of transmission: Secondary | ICD-10-CM | POA: Diagnosis not present

## 2017-01-16 DIAGNOSIS — B354 Tinea corporis: Secondary | ICD-10-CM | POA: Insufficient documentation

## 2017-01-16 DIAGNOSIS — D649 Anemia, unspecified: Secondary | ICD-10-CM | POA: Insufficient documentation

## 2017-01-16 MED ORDER — CLOTRIMAZOLE 1 % EX OINT: TOPICAL_OINTMENT | CUTANEOUS | 1 refills | Status: DC

## 2017-01-16 NOTE — Progress Notes (Signed)
Pt had an abortion 12/21/16 and stop bleeding for about 2 weeks ago. Pt declined contraceptive

## 2017-01-16 NOTE — Progress Notes (Signed)
Obstetrics and Gynecology Annual Patient Evaluation  Appointment Date: 01/16/2017  OBGYN Clinic: [facility]  Primary Care Provider: None  Chief Complaint: Annual exam  History of Present Illness: Darlene Hayes is a 32 y.o. African-American G2P1011 (LMP: none), seen for the above chief complaint.   S/p 16wk elective AB in [place] via surgery. No VB, fevers, chills, pain or period yet. She still has some inguinal fold pruritis that was ongoing during her pregnancy; she hasn't tried anything OTC  No breast s/s, nausea, vomiting, dysuria, vaginal itching, dyspareunia, diarrhea, constipation  Review of Systems: as noted in the History of Present Illness.  Past Medical History:  Past Medical History:  Diagnosis Date  . Abnormal Pap smear    abn paps since 2000. no additional testing done.   . Chlamydia 07/2010   treated. hasn't been with that partner since.   . Gonorrhea    treated in July 2012. hasn't been with that partner since.   . Vaginal Pap smear, abnormal     Past Surgical History:  Past Surgical History:  Procedure Laterality Date  . NO PAST SURGERIES      Past Obstetrical History:  OB History  Gravida Para Term Preterm AB Living  2 1 1  0 0 1  SAB TAB Ectopic Multiple Live Births  0 0 0 0 1    # Outcome Date GA Lbr Len/2nd Weight Sex Delivery Anes PTL Lv  2 Gravida           1 Term 09/03/15 [redacted]w[redacted]d 11:48 / 01:16 7 lb 14.8 oz (3.595 kg) M Vag-Spont EPI  LIV      Past Gynecological History: As per HPI. History of Pap Smear(s): Yes.   Last pap 2017, which was NILM/HPV neg History of STI(s): Yes.   She is currently using nothing for contraception.    Social History:  Social History   Socioeconomic History  . Marital status: Single    Spouse name: Not on file  . Number of children: Not on file  . Years of education: Not on file  . Highest education level: Not on file  Social Needs  . Financial resource strain: Not on file  .  Food insecurity - worry: Not on file  . Food insecurity - inability: Not on file  . Transportation needs - medical: Not on file  . Transportation needs - non-medical: Not on file  Occupational History  . Not on file  Tobacco Use  . Smoking status: Former Smoker    Packs/day: 0.15    Types: Cigarettes    Last attempt to quit: 01/01/2015    Years since quitting: 2.0  . Smokeless tobacco: Never Used  Substance and Sexual Activity  . Alcohol use: No  . Drug use: No  . Sexual activity: Not Currently    Birth control/protection: None  Other Topics Concern  . Not on file  Social History Narrative  . Not on file    Family History:  Family History  Problem Relation Age of Onset  . Hypertension Mother   . Miscarriages / Stillbirths Sister   . Hypertension Sister   . Stroke Maternal Grandmother   . Diabetes Maternal Grandmother    She denies any female cancers, bleeding or blood clotting disorders; ?h/o ovarian cancer in a great aunt    Medications Darlene Hayes had no medications administered during this visit. Current Outpatient Medications  Medication Sig Dispense Refill  . Prenatal MV & Min w/FA-DHA (PRENATAL ADULT GUMMY/DHA/FA)  0.4-25 MG CHEW Chew 2 tablets by mouth daily.    . Clotrimazole 1 % OINT Apply to affected areas bid 1 Tube 1   No current facility-administered medications for this visit.     Allergies Patient has no known allergies.   Physical Exam:  BP 124/70   Pulse 86   Wt 214 lb (97.1 kg)   LMP 08/30/2016   Breastfeeding? Unknown   BMI 34.54 kg/m  Body mass index is 34.54 kg/m. General appearance: Well nourished, well developed female in no acute distress.  Neck:  Supple, normal appearance, and no thyromegaly  Cardiovascular: normal s1 and s2.  No murmurs, rubs or gallops. Respiratory:  Clear to auscultation bilateral. Normal respiratory effort Abdomen: positive bowel sounds and no masses, hernias; diffusely non tender to palpation, non  distended Breasts: pt declines Neuro/Psych:  Normal mood and affect.  Skin:  Warm and dry.  Lymphatic:  No inguinal lymphadenopathy. In the b/l inguinal is darker rash that appears fungal  Pelvic exam: is not limited by body habitus EGBUS: within normal limits, Vagina: within normal limits and with no blood or discharge in the vault, Cervix: normal appearing cervix without tenderness, discharge or lesions. Uterus:  nonenlarged and non tender and Adnexa:  normal adnexa and no mass, fullness, tenderness Rectovaginal: deferred  Laboratory: none  Radiology: none  Assessment: pt stable  Plan:  1. Encounter for gynecological examination Routine care. Pt [facility]. Pt amenable to screening labs since she doesn't have a PCP. F/u cbc for h/o anemia. Weight stable. Encouraged strategies for weight loss, exercise.  - CBC - Lipid panel - TSH - CMP and Liver - HIV antibody (with reflex) - RPR - Hepatitis B Surface AntiGEN - Hepatitis C Antibody - Cervicovaginal ancillary only  2. Tinea corporis Clotrimazole cream   RTC PRN  Darlene Copaharlie Kaiel Hayes, Jr MD Attending [facility](Faculty Practice)

## 2017-01-17 LAB — CERVICOVAGINAL ANCILLARY ONLY
CHLAMYDIA, DNA PROBE: NEGATIVE
Neisseria Gonorrhea: NEGATIVE
Trichomonas: NEGATIVE

## 2017-01-17 LAB — HEMOGLOBIN A1C
Est. average glucose Bld gHb Est-mCnc: 105 mg/dL
Hgb A1c MFr Bld: 5.3 % (ref 4.8–5.6)

## 2017-01-18 LAB — TSH: TSH: 1.91 u[IU]/mL (ref 0.450–4.500)

## 2017-01-18 LAB — CMP AND LIVER
ALBUMIN: 4.5 g/dL (ref 3.5–5.5)
ALK PHOS: 115 IU/L (ref 39–117)
ALT: 10 IU/L (ref 0–32)
AST: 23 IU/L (ref 0–40)
BUN: 6 mg/dL (ref 6–20)
Bilirubin Total: 0.3 mg/dL (ref 0.0–1.2)
Bilirubin, Direct: 0.09 mg/dL (ref 0.00–0.40)
CALCIUM: 9.1 mg/dL (ref 8.7–10.2)
CO2: 22 mmol/L (ref 20–29)
CREATININE: 0.73 mg/dL (ref 0.57–1.00)
Chloride: 101 mmol/L (ref 96–106)
GFR calc Af Amer: 126 mL/min/{1.73_m2} (ref >59)
GFR calc non Af Amer: 109 mL/min/{1.73_m2} (ref >59)
Glucose: 79 mg/dL (ref 65–99)
POTASSIUM: 4 mmol/L (ref 3.5–5.2)
SODIUM: 140 mmol/L (ref 134–144)
TOTAL PROTEIN: 7.9 g/dL (ref 6.0–8.5)

## 2017-01-18 LAB — CBC
Hematocrit: 36.1 % (ref 34.0–46.6)
Hemoglobin: 11.5 g/dL (ref 11.1–15.9)
MCH: 26.8 pg (ref 26.6–33.0)
MCHC: 31.9 g/dL (ref 31.5–35.7)
MCV: 84 fL (ref 79–97)
Platelets: 212 10*3/uL (ref 150–379)
RBC: 4.29 x10E6/uL (ref 3.77–5.28)
RDW: 18.3 % — AB (ref 12.3–15.4)
WBC: 3.5 10*3/uL (ref 3.4–10.8)

## 2017-01-18 LAB — LIPID PANEL
CHOL/HDL RATIO: 3.3 ratio (ref 0.0–4.4)
Cholesterol, Total: 201 mg/dL — ABNORMAL HIGH (ref 100–199)
HDL: 61 mg/dL (ref >39)
LDL Calculated: 128 mg/dL — ABNORMAL HIGH (ref 0–99)
Triglycerides: 58 mg/dL (ref 0–149)
VLDL Cholesterol Cal: 12 mg/dL (ref 5–40)

## 2017-01-18 LAB — RPR: RPR Ser Ql: NONREACTIVE

## 2017-01-18 LAB — HIV ANTIBODY (ROUTINE TESTING W REFLEX): HIV Screen 4th Generation wRfx: NONREACTIVE

## 2017-01-18 LAB — HEPATITIS B SURFACE ANTIGEN: Hepatitis B Surface Ag: NEGATIVE

## 2017-01-18 LAB — HEPATITIS C ANTIBODY

## 2017-01-22 ENCOUNTER — Telehealth: Payer: Self-pay | Admitting: General Practice

## 2017-01-22 NOTE — Telephone Encounter (Signed)
Patient called and left message on nurse line stating the pharmacy cannot release her medication until we talk to them. Called patient and she states she is having problems getting a medication and she is also locked out of her mychart so she cannot get results. Told patient I clarified the Rx with her pharmacy so they are getting that ready for her. Informed patient of test results and cholesterol levels show she could benefit from a healthier diet (less processed foods, more vegetables) & exercise. Patient verbalized understanding to all & had no questions. mychart link resent to patient.

## 2017-01-30 ENCOUNTER — Encounter: Payer: Self-pay | Admitting: Obstetrics and Gynecology

## 2017-01-30 DIAGNOSIS — E785 Hyperlipidemia, unspecified: Secondary | ICD-10-CM | POA: Insufficient documentation

## 2018-08-24 ENCOUNTER — Other Ambulatory Visit: Payer: Self-pay

## 2018-08-24 ENCOUNTER — Other Ambulatory Visit: Payer: Self-pay | Admitting: Podiatry

## 2018-08-24 ENCOUNTER — Encounter: Payer: Self-pay | Admitting: Podiatry

## 2018-08-24 ENCOUNTER — Ambulatory Visit: Payer: BC Managed Care – PPO | Admitting: Podiatry

## 2018-08-24 ENCOUNTER — Ambulatory Visit (INDEPENDENT_AMBULATORY_CARE_PROVIDER_SITE_OTHER): Payer: BC Managed Care – PPO

## 2018-08-24 VITALS — BP 115/78 | Temp 97.6°F

## 2018-08-24 DIAGNOSIS — M778 Other enthesopathies, not elsewhere classified: Secondary | ICD-10-CM

## 2018-08-24 DIAGNOSIS — M84375A Stress fracture, left foot, initial encounter for fracture: Secondary | ICD-10-CM | POA: Diagnosis not present

## 2018-08-24 DIAGNOSIS — M779 Enthesopathy, unspecified: Secondary | ICD-10-CM

## 2018-08-24 DIAGNOSIS — M7741 Metatarsalgia, right foot: Secondary | ICD-10-CM

## 2018-08-24 DIAGNOSIS — M7742 Metatarsalgia, left foot: Secondary | ICD-10-CM

## 2018-08-26 NOTE — Progress Notes (Signed)
   HPI: 33 y.o. female presenting today as a new patient, referred by Dr. Gershon Mussel, with a chief complaint of shooting pain to the dorsal aspect of the left foot that began about two months ago. She believes she may have a stress fracture. Applying pressure to the area increases the pain. She has been using a CAM boot for treatment. Patient is here for further evaluation and treatment.   Past Medical History:  Diagnosis Date  . Abnormal Pap smear    abn paps since 2000. no additional testing done.   . Chlamydia 07/2010   treated. hasn't been with that partner since.   . Gonorrhea    treated in July 2012. hasn't been with that partner since.   . Vaginal Pap smear, abnormal      Physical Exam: General: The patient is alert and oriented x3 in no acute distress.  Dermatology: Skin is warm, dry and supple bilateral lower extremities. Negative for open lesions or macerations.  Vascular: Palpable pedal pulses bilaterally. No edema or erythema noted. Capillary refill within normal limits.  Neurological: Epicritic and protective threshold grossly intact bilaterally.   Musculoskeletal Exam: Pain with palpation noted to the left midfoot and the metatarsal heads of the feet bilaterally. Range of motion within normal limits to all pedal and ankle joints bilateral. Muscle strength 5/5 in all groups bilateral.   Radiographic Exam:  Normal osseous mineralization. Joint spaces preserved. No fracture/dislocation/boney destruction.    Assessment: 1. Metatarsalgia bilateral 2. Capsulitis left foot   Plan of Care:  1. Patient evaluated. X-Rays reviewed.  2. Appointment with Liliane Channel, Pedorthist, for custom molded orthotics.  3. Continue using CAM boot as needed.  4. Return to clinic in 3 months.   Works at [employer].       Edrick Kins, DPM [facility]  Dr. Edrick Kins, DPM    [address], [place] [address]                Office  [phone]  Fax [phone]

## 2018-09-08 ENCOUNTER — Ambulatory Visit (INDEPENDENT_AMBULATORY_CARE_PROVIDER_SITE_OTHER): Payer: BC Managed Care – PPO | Admitting: Orthotics

## 2018-09-08 ENCOUNTER — Other Ambulatory Visit: Payer: Self-pay

## 2018-09-08 DIAGNOSIS — M779 Enthesopathy, unspecified: Secondary | ICD-10-CM | POA: Diagnosis not present

## 2018-09-08 DIAGNOSIS — M7742 Metatarsalgia, left foot: Secondary | ICD-10-CM

## 2018-09-08 DIAGNOSIS — M778 Other enthesopathies, not elsewhere classified: Secondary | ICD-10-CM

## 2018-09-08 DIAGNOSIS — M7741 Metatarsalgia, right foot: Secondary | ICD-10-CM

## 2018-09-08 NOTE — Progress Notes (Signed)
Patient was cast today for Functional orthotics with good arch support; she has had hx of midfoot fx.  Plan on Richy to fab

## 2018-09-29 ENCOUNTER — Other Ambulatory Visit: Payer: BC Managed Care – PPO | Admitting: Orthotics

## 2018-10-05 ENCOUNTER — Other Ambulatory Visit: Payer: BC Managed Care – PPO | Admitting: Orthotics

## 2018-10-15 ENCOUNTER — Ambulatory Visit: Payer: BC Managed Care – PPO | Admitting: Obstetrics and Gynecology

## 2018-11-23 ENCOUNTER — Encounter: Payer: BC Managed Care – PPO | Admitting: Orthotics

## 2018-11-23 ENCOUNTER — Ambulatory Visit: Payer: BC Managed Care – PPO | Admitting: Podiatry

## 2019-02-16 ENCOUNTER — Other Ambulatory Visit: Payer: BC Managed Care – PPO

## 2019-02-18 ENCOUNTER — Ambulatory Visit: Payer: BC Managed Care – PPO | Attending: Internal Medicine

## 2019-02-18 DIAGNOSIS — Z20822 Contact with and (suspected) exposure to covid-19: Secondary | ICD-10-CM

## 2019-02-19 LAB — NOVEL CORONAVIRUS, NAA: SARS-CoV-2, NAA: NOT DETECTED

## 2019-06-16 ENCOUNTER — Other Ambulatory Visit: Payer: Self-pay

## 2019-06-16 ENCOUNTER — Encounter (HOSPITAL_COMMUNITY): Payer: Self-pay | Admitting: Obstetrics and Gynecology

## 2019-06-16 ENCOUNTER — Inpatient Hospital Stay (HOSPITAL_COMMUNITY)
Admission: AD | Admit: 2019-06-16 | Discharge: 2019-06-16 | Disposition: A | Discharge status: Home / Self Care | Payer: BC Managed Care – PPO | Attending: Obstetrics and Gynecology | Admitting: Obstetrics and Gynecology

## 2019-06-16 DIAGNOSIS — N898 Other specified noninflammatory disorders of vagina: Secondary | ICD-10-CM

## 2019-06-16 NOTE — MAU Note (Signed)
Presents to MAU with c/o vaginal discharge, late period, and knot in vagina.  States hasn't taken HPT. Reports vaginal discharge doesn't have any odor, "creamy" white consistency.

## 2019-06-16 NOTE — MAU Provider Note (Signed)
First Provider Initiated Contact with Patient 06/16/19 1654      S [name] is a 34 y.o. G15P1001 female who presents to MAU today with complaint of vaginal discharge, mass in her vagina and late period. Hasn't taken pregnancy test. Dr. Vergie Living is her Ob/Gyn and she has an appt w/ him 7/19, but doesn't think she can wait that long.   Denies abd pain, vaginal bleeding, fever.  O BP 123/72 (BP Location: Right Arm)   Pulse 98   Temp 99.2 F (37.3 C) (Oral)   Resp 18   Ht 5\' 6"  (1.676 m)   Wt 94.2 kg   LMP 05/16/2019   SpO2 100%   BMI 33.51 kg/m  Physical Exam  Nursing note and vitals reviewed. Constitutional: She appears well-developed and well-nourished. No distress.  Cardiovascular: Normal rate.  Respiratory: Effort normal.    A Medical screening exam complete 1. Vaginal discharge    P Discharge from MAU in stable condition Patient given the option of transfer to [facility] for further evaluation or seek care in outpatient facility of choice. Plans OP F/U In-basket message sent to [facility] for Nurse visit APAP and move up Gyn visit if possible.   Warning signs for worsening condition that would warrant emergency follow-up discussed Patient may return to MAU as needed for pregnancy related complaints  [facility], Katrinka Blazing, [place] 06/16/2019 5:16 PM

## 2019-06-17 ENCOUNTER — Telehealth: Payer: Self-pay | Admitting: Obstetrics and Gynecology

## 2019-06-17 NOTE — Telephone Encounter (Signed)
I spoke with [name] concerning an issues she had with scheduling her appointment. She stated she only wanted to see Dr. Vergie Living because he is her doctor, and this is who she sees. She felt July 21st was too far, and wanted to be seen sooner. Scheduled for June 25th, and she was thankful.

## 2019-07-02 ENCOUNTER — Other Ambulatory Visit: Payer: Self-pay

## 2019-07-02 ENCOUNTER — Ambulatory Visit (INDEPENDENT_AMBULATORY_CARE_PROVIDER_SITE_OTHER): Payer: Medicaid Other | Admitting: Obstetrics and Gynecology

## 2019-07-02 ENCOUNTER — Encounter: Payer: Self-pay | Admitting: Obstetrics and Gynecology

## 2019-07-02 ENCOUNTER — Other Ambulatory Visit (HOSPITAL_COMMUNITY)
Admission: RE | Admit: 2019-07-02 | Discharge: 2019-07-02 | Disposition: A | Discharge status: Home / Self Care | Payer: Medicaid Other | Source: Ambulatory Visit | Attending: Obstetrics and Gynecology | Admitting: Obstetrics and Gynecology

## 2019-07-02 VITALS — BP 138/93 | HR 93 | Ht 65.0 in | Wt 208.1 lb

## 2019-07-02 DIAGNOSIS — Z01419 Encounter for gynecological examination (general) (routine) without abnormal findings: Secondary | ICD-10-CM | POA: Insufficient documentation

## 2019-07-02 DIAGNOSIS — D509 Iron deficiency anemia, unspecified: Secondary | ICD-10-CM

## 2019-07-02 DIAGNOSIS — R030 Elevated blood-pressure reading, without diagnosis of hypertension: Secondary | ICD-10-CM

## 2019-07-02 NOTE — Progress Notes (Signed)
Patient is here today for pap smear

## 2019-07-02 NOTE — Progress Notes (Signed)
Obstetrics and Gynecology Annual Patient Evaluation  Appointment Date: 07/02/2019  OBGYN Clinic: [facility]  Primary Care Provider: None  Chief Complaint:  Chief Complaint  Patient presents with  . Gynecologic Exam    History of Present Illness: Darlene Hayes is a 34 y.o. African-American G2P1011 (LMP June 2021), seen for the above chief complaint. Her past medical history is significant for BMI 30s, elevated BP today  Patient denies any GYN problems or issues today except her period this month was only 3 days so shorter than her usual week and she's wondering if that's okay. Not heavy or painful.    Review of Systems: Pertinent items noted in HPI and remainder of comprehensive ROS otherwise negative.   Patient Active Problem List   Diagnosis Date Noted  . Elevated blood pressure reading 07/02/2019  . Hyperlipidemia 01/30/2017  . Obesity (BMI 30-39.9) 01/16/2017  . Anemia 01/16/2017  . Tinea corporis 01/16/2017  . Postpartum depression 10/19/2015  . 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 Smoker 10/13/2012  . Obesity, unspecified 10/13/2012   Past Medical History:  Past Medical History:  Diagnosis Date  . Abnormal Pap smear    abn paps since 2000. no additional testing done.   . Chlamydia 07/2010   treated. hasn't been with that partner since.   . Gonorrhea    treated in July 2012. hasn't been with that partner since.   . Vaginal Pap smear, abnormal     Past Surgical History:  Past Surgical History:  Procedure Laterality Date  . DILATION AND CURETTAGE OF UTERUS  12/2016   EAB done in [employer]    Past Obstetrical History:  OB History  Gravida Para Term Preterm AB Living  2 1 1  0 1 1  SAB TAB Ectopic Multiple Live Births  0 1 0 0 1    # Outcome Date GA Lbr Len/2nd Weight Sex Delivery Anes PTL Lv  2 TAB 2019 [redacted]w[redacted]d         1 Term 09/03/15 [redacted]w[redacted]d 11:48 / 01:16 7 lb 14.8 oz (3.595 kg) M Vag-Spont EPI  LIV    Past Gynecological History: As  per HPI. Periods: qmonth, one week, not heavy or painful History of Pap Smear(s): Yes.   Last pap 2017, which was negative She is currently using no method for contraception.   Social History:  Social History   Socioeconomic History  . Marital status: Single    Spouse name: Not on file  . Number of children: Not on file  . Years of education: Not on file  . Highest education level: Not on file  Occupational History  . Not on file  Tobacco Use  . Smoking status: Former Smoker    Packs/day: 0.15    Types: Cigarettes    Quit date: 01/01/2015    Years since quitting: 4.5  . Smokeless tobacco: Never Used  Substance and Sexual Activity  . Alcohol use: No  . Drug use: No  . Sexual activity: Not Currently    Birth control/protection: None  Other Topics Concern  . Not on file  Social History Narrative  . Not on file   Social Determinants of Health   Financial Resource Strain:   . Difficulty of Paying Living Expenses:   Food Insecurity:   . Worried About Charity fundraiser in the Last Year:   . Arboriculturist in the Last Year:   Transportation Needs:   . Film/video editor (Medical):   Marland Kitchen Lack of  Transportation (Non-Medical):   Physical Activity:   . Days of Exercise per Week:   . Minutes of Exercise per Session:   Stress:   . Feeling of Stress :   Social Connections:   . Frequency of Communication with Friends and Family:   . Frequency of Social Gatherings with Friends and Family:   . Attends Religious Services:   . Active Member of Clubs or Organizations:   . Attends Banker Meetings:   Marland Kitchen Marital Status:   Intimate Partner Violence:   . Fear of Current or Ex-Partner:   . Emotionally Abused:   Marland Kitchen Physically Abused:   . Sexually Abused:     Family History:  Family History  Problem Relation Age of Onset  . Hypertension Mother   . Miscarriages / Stillbirths Sister   . Hypertension Sister   . Stroke Maternal Grandmother   . Diabetes Maternal  Grandmother     Medications None  Allergies Patient has no known allergies.   Physical Exam:  BP (!) 138/93   Pulse 93   Ht 5\' 5"  (1.651 m)   Wt 208 lb 1.6 oz (94.4 kg)   BMI 34.63 kg/m  Body mass index is 34.63 kg/m. General appearance: Well nourished, well developed female in no acute distress.  Neck:  Supple, normal appearance, and no thyromegaly  Cardiovascular: normal s1 and s2.  No murmurs, rubs or gallops. Respiratory:  Clear to auscultation bilateral. Normal respiratory effort Abdomen: positive bowel sounds and no masses, hernias; diffusely non tender to palpation, non distended Breasts: pt denies any breast s/s Neuro/Psych:  Normal mood and affect.  Skin:  Warm and dry.  Lymphatic:  No inguinal lymphadenopathy.   Pelvic exam: is not limited by body habitus EGBUS: within normal limits Vagina: within normal limits and with no blood or discharge in the vault Cervix: normal appearing cervix without tenderness, discharge or lesions. Uterus:  nonenlarged and non tender Adnexa:  normal adnexa and no mass, fullness, tenderness Rectovaginal: deferred  Laboratory: none  Radiology: none  Assessment: pt doing well  Plan:  1. Women's annual routine gynecological examination Routine care. Declines contraception today. Pt amenable to basic labs.  - Cytology - PAP( [place]) - Cervicovaginal ancillary only( [place]) - Hemoglobin A1c - CBC - Comprehensive metabolic panel - HIV antibody (with reflex) - RPR - Hepatitis B Surface AntiGEN - Hepatitis C Antibody - TSH - Lipid panel - Urinalysis, Routine w reflex microscopic  2. Elevated blood pressure reading D/w her re: checking BPs at home to call if still elevated.  Orders Placed This Encounter  Procedures  . Hemoglobin A1c  . CBC  . Comprehensive metabolic panel  . HIV antibody (with reflex)  . RPR  . Hepatitis B Surface AntiGEN  . Hepatitis C Antibody  . TSH  . Lipid panel  . Urinalysis,  Routine w reflex microscopic    RTC 1 year  [place], [place] Bing MD Attending [facility] [employer])

## 2019-07-02 NOTE — Patient Instructions (Signed)
I recommend you buy an arm blood pressure cuff and take some blood pressures at home. Call us if they are consistently above 130 for the top number and/or above 80 for the bottom number

## 2019-07-03 LAB — URINALYSIS, ROUTINE W REFLEX MICROSCOPIC
Bilirubin, UA: NEGATIVE
Glucose, UA: NEGATIVE
Ketones, UA: NEGATIVE
Leukocytes,UA: NEGATIVE
Nitrite, UA: NEGATIVE
Protein,UA: NEGATIVE
RBC, UA: NEGATIVE
Specific Gravity, UA: 1.024 (ref 1.005–1.030)
Urobilinogen, Ur: 0.2 mg/dL (ref 0.2–1.0)
pH, UA: 6.5 (ref 5.0–7.5)

## 2019-07-05 LAB — CBC
Hematocrit: 24.9 % — ABNORMAL LOW (ref 34.0–46.6)
Hemoglobin: 6.9 g/dL — CL (ref 11.1–15.9)
MCH: 20.1 pg — ABNORMAL LOW (ref 26.6–33.0)
MCHC: 27.7 g/dL — ABNORMAL LOW (ref 31.5–35.7)
MCV: 72 fL — ABNORMAL LOW (ref 79–97)
Platelets: 245 10*3/uL (ref 150–450)
RBC: 3.44 x10E6/uL — ABNORMAL LOW (ref 3.77–5.28)
RDW: 18.8 % — ABNORMAL HIGH (ref 11.7–15.4)
WBC: 3.2 10*3/uL — ABNORMAL LOW (ref 3.4–10.8)

## 2019-07-05 LAB — CERVICOVAGINAL ANCILLARY ONLY
Bacterial Vaginitis (gardnerella): POSITIVE — AB
Candida Glabrata: NEGATIVE
Candida Vaginitis: POSITIVE — AB
Comment: NEGATIVE
Comment: NEGATIVE
Comment: NEGATIVE
Comment: NEGATIVE
Trichomonas: NEGATIVE

## 2019-07-05 LAB — COMPREHENSIVE METABOLIC PANEL
ALT: 8 IU/L (ref 0–32)
AST: 16 IU/L (ref 0–40)
Albumin/Globulin Ratio: 1.4 (ref 1.2–2.2)
Albumin: 4.4 g/dL (ref 3.8–4.8)
Alkaline Phosphatase: 96 IU/L (ref 48–121)
BUN/Creatinine Ratio: 8 — ABNORMAL LOW (ref 9–23)
BUN: 6 mg/dL (ref 6–20)
Bilirubin Total: 0.3 mg/dL (ref 0.0–1.2)
CO2: 22 mmol/L (ref 20–29)
Calcium: 9 mg/dL (ref 8.7–10.2)
Chloride: 105 mmol/L (ref 96–106)
Creatinine, Ser: 0.71 mg/dL (ref 0.57–1.00)
GFR calc Af Amer: 129 mL/min/{1.73_m2} (ref >59)
GFR calc non Af Amer: 111 mL/min/{1.73_m2} (ref >59)
Globulin, Total: 3.2 g/dL (ref 1.5–4.5)
Glucose: 89 mg/dL (ref 65–99)
Potassium: 4 mmol/L (ref 3.5–5.2)
Sodium: 140 mmol/L (ref 134–144)
Total Protein: 7.6 g/dL (ref 6.0–8.5)

## 2019-07-05 LAB — HEPATITIS C ANTIBODY: Hep C Virus Ab: <0.1 s/co ratio (ref 0.0–0.9)

## 2019-07-05 LAB — LIPID PANEL
Chol/HDL Ratio: 2.8 ratio (ref 0.0–4.4)
Cholesterol, Total: 154 mg/dL (ref 100–199)
HDL: 55 mg/dL (ref >39)
LDL Chol Calc (NIH): 90 mg/dL (ref 0–99)
Triglycerides: 38 mg/dL (ref 0–149)
VLDL Cholesterol Cal: 9 mg/dL (ref 5–40)

## 2019-07-05 LAB — CYTOLOGY - PAP
Chlamydia: NEGATIVE
Comment: NEGATIVE
Comment: NEGATIVE
Comment: NORMAL
Diagnosis: NEGATIVE
High risk HPV: NEGATIVE
Neisseria Gonorrhea: NEGATIVE

## 2019-07-05 LAB — HIV ANTIBODY (ROUTINE TESTING W REFLEX): HIV Screen 4th Generation wRfx: NONREACTIVE

## 2019-07-05 LAB — TSH: TSH: 1.17 u[IU]/mL (ref 0.450–4.500)

## 2019-07-05 LAB — HEMOGLOBIN A1C
Est. average glucose Bld gHb Est-mCnc: 111 mg/dL
Hgb A1c MFr Bld: 5.5 % (ref 4.8–5.6)

## 2019-07-05 LAB — RPR: RPR Ser Ql: NONREACTIVE

## 2019-07-05 LAB — HEPATITIS B SURFACE ANTIGEN: Hepatitis B Surface Ag: NEGATIVE

## 2019-07-05 NOTE — Addendum Note (Signed)
Addended by: [place] Bing on: 07/05/2019 04:08 PM   Modules accepted: Orders

## 2019-07-06 ENCOUNTER — Telehealth: Payer: Self-pay | Admitting: *Deleted

## 2019-07-06 NOTE — Telephone Encounter (Signed)
-----   Message from [place] Bing, MD sent at 07/05/2019  4:09 PM EDT ----- Can you have the lab add on an anemia panel? Order is in. thanks

## 2019-07-06 NOTE — Telephone Encounter (Signed)
Notified lab to add on anemia panel per Dr. Vergie Living.  Hawley Pavia,RN

## 2019-07-08 ENCOUNTER — Telehealth: Payer: Self-pay

## 2019-07-08 ENCOUNTER — Telehealth (INDEPENDENT_AMBULATORY_CARE_PROVIDER_SITE_OTHER): Payer: Self-pay | Admitting: Obstetrics and Gynecology

## 2019-07-08 DIAGNOSIS — N760 Acute vaginitis: Secondary | ICD-10-CM

## 2019-07-08 DIAGNOSIS — B9689 Other specified bacterial agents as the cause of diseases classified elsewhere: Secondary | ICD-10-CM

## 2019-07-08 MED ORDER — FLUCONAZOLE 150 MG PO TABS: 150.0000 mg | ORAL_TABLET | Freq: Once | ORAL | 0 refills | Status: AC

## 2019-07-08 MED ORDER — METRONIDAZOLE 500 MG PO TABS: 500.0000 mg | ORAL_TABLET | Freq: Two times a day (BID) | ORAL | 0 refills | Status: DC

## 2019-07-08 NOTE — Telephone Encounter (Signed)
Pt called about test results that she received last night.  Pt reports that her concern was already addressed.    Addison Naegeli, RN

## 2019-07-08 NOTE — Telephone Encounter (Addendum)
Returned patients call about lab results. Patient would like treatment for BV and Yeast infections. Prescriptions sent to pharmacy.   Patient reports she is not on Iron currenlty. Will forward massage to Dr. Vergie Living for evaluation.

## 2019-07-08 NOTE — Telephone Encounter (Signed)
Pt called and stated that she has questions about test results that she received last night. Opened in Error.

## 2019-07-08 NOTE — Telephone Encounter (Signed)
Patient saw her results last night in her MyChart, and she would like to et something called in for this. Patient is requesting something be called in today. She searched on Google, and she does not want to wait.

## 2019-07-13 ENCOUNTER — Telehealth: Payer: Self-pay | Admitting: Obstetrics and Gynecology

## 2019-07-13 DIAGNOSIS — D649 Anemia, unspecified: Secondary | ICD-10-CM

## 2019-07-13 NOTE — Telephone Encounter (Signed)
GYN Telephone Note I don't see the prior anemia panel done so I don't think they were able to run it off the old samples. Pt called and d/w her and she confirms that her periods are not heavy; she does have some chronic anemia but not this bad. Pt not symptomatic. D/w her I recommend more lab work and she will then likely need iv iron and may need referral to a specialist to see why she is chronically anemic. Patient amenable to plan.

## 2019-07-14 ENCOUNTER — Other Ambulatory Visit: Payer: Medicaid Other

## 2019-07-14 ENCOUNTER — Other Ambulatory Visit: Payer: Self-pay

## 2019-07-16 LAB — ANEMIA PROFILE B
Basophils Absolute: 0 10*3/uL (ref 0.0–0.2)
Basos: 1 %
EOS (ABSOLUTE): 0.2 10*3/uL (ref 0.0–0.4)
Eos: 4 %
Ferritin: 3 ng/mL — ABNORMAL LOW (ref 15–150)
Folate: 10.7 ng/mL (ref >3.0)
Hematocrit: 23.4 % — ABNORMAL LOW (ref 34.0–46.6)
Hemoglobin: 6.9 g/dL — CL (ref 11.1–15.9)
Immature Grans (Abs): 0 10*3/uL (ref 0.0–0.1)
Immature Granulocytes: 1 %
Iron Saturation: 3 % — CL (ref 15–55)
Iron: 12 ug/dL — ABNORMAL LOW (ref 27–159)
Lymphocytes Absolute: 1.3 10*3/uL (ref 0.7–3.1)
Lymphs: 32 %
MCH: 21 pg — ABNORMAL LOW (ref 26.6–33.0)
MCHC: 29.5 g/dL — ABNORMAL LOW (ref 31.5–35.7)
MCV: 71 fL — ABNORMAL LOW (ref 79–97)
Monocytes Absolute: 0.4 10*3/uL (ref 0.1–0.9)
Monocytes: 9 %
Neutrophils Absolute: 2.1 10*3/uL (ref 1.4–7.0)
Neutrophils: 53 %
Platelets: 248 10*3/uL (ref 150–450)
RBC: 3.29 x10E6/uL — ABNORMAL LOW (ref 3.77–5.28)
RDW: 19.6 % — ABNORMAL HIGH (ref 11.7–15.4)
Retic Ct Pct: 1.2 % (ref 0.6–2.6)
Total Iron Binding Capacity: 410 ug/dL (ref 250–450)
UIBC: 398 ug/dL (ref 131–425)
Vitamin B-12: 297 pg/mL (ref 232–1245)
WBC: 3.9 10*3/uL (ref 3.4–10.8)

## 2019-07-16 LAB — HEMOGLOBPATHY+FER W/A THAL RFX
Hgb A2: 1.7 % — ABNORMAL LOW (ref 1.8–3.2)
Hgb A: 97.6 % (ref 96.4–98.8)
Hgb F: 0.7 % (ref 0.0–2.0)
Hgb S: 0 %

## 2019-07-21 ENCOUNTER — Telehealth: Payer: Self-pay | Admitting: Lactation Services

## 2019-07-21 ENCOUNTER — Other Ambulatory Visit: Payer: Self-pay | Admitting: Obstetrics and Gynecology

## 2019-07-21 NOTE — Telephone Encounter (Signed)
Called Short Stay Unit to schedule Feraheme infusion x 2. LM for Short Stay to call the office at [phone] to schedule.

## 2019-07-21 NOTE — Telephone Encounter (Signed)
-----   Message from [place] Bing, MD sent at 07/21/2019 10:53 AM EDT ----- Can you schedule her for IV feraheme, two doses a week apart? Orders are in. Thanks!

## 2019-07-22 NOTE — Telephone Encounter (Signed)
I called Short stay to schedule fereheme.  Scheduled for first available 07/28/19 at 10am.  I called Gabryelle and notified her of anemia and orders by Dr. Vergie Living for fereheme. She confirms she has seen her lab results. I informed her of 7'21/21 appt and she asked if I can change to 08/03/19 because she started a new job and is off  08/03/19.  I called Short Stay and changed appt to 08/03/19 at 1pm. I notified Shaniya of new appt and to go to [facility] Entrance A admitting .  I explained they will schedule 2nd appointment with her that day. She voices understanding. Rhemi Balbach,RN

## 2019-07-26 ENCOUNTER — Ambulatory Visit: Payer: BC Managed Care – PPO | Admitting: Obstetrics and Gynecology

## 2019-07-28 ENCOUNTER — Encounter (HOSPITAL_COMMUNITY): Payer: Medicaid Other

## 2019-07-28 LAB — ANEMIA PANEL
Ferritin: 3 ng/mL — ABNORMAL LOW (ref 15–150)
Hematocrit: 24.9 % — ABNORMAL LOW (ref 34.0–46.6)
Iron Saturation: 3 % — CL (ref 15–55)
Iron: 15 ug/dL — ABNORMAL LOW (ref 27–159)
Retic Ct Pct: 1.4 % (ref 0.6–2.6)
Total Iron Binding Capacity: 450 ug/dL (ref 250–450)
UIBC: 435 ug/dL — ABNORMAL HIGH (ref 131–425)
Vitamin B-12: 321 pg/mL (ref 232–1245)

## 2019-07-28 LAB — SPECIMEN STATUS REPORT

## 2019-08-01 IMAGING — US US OB COMP LESS 14 WK
1 series · 15 of 28 positions shown · non-contrast
Comparison: No prior scans from this gestation.

CLINICAL DATA: 31-year-old pregnant female with 2 weeks of
abdominal pain. Quantitative beta HCG level is pending.

EDC by LMP: 06/06/2017, projecting to an expected gestational age of
6 weeks 3 days.
EXAM:
OBSTETRIC <14 WK US AND TRANSVAGINAL OB US
TECHNIQUE: Both transabdominal and transvaginal ultrasound examinations were
performed for complete evaluation of the gestation as well as the
maternal uterus, adnexal regions, and pelvic cul-de-sac.
Transvaginal technique was performed to assess early pregnancy.

[Series 1: us ob comp less 14 wk · 36 acquisitions, 15 frames shown]
[im 1/36]
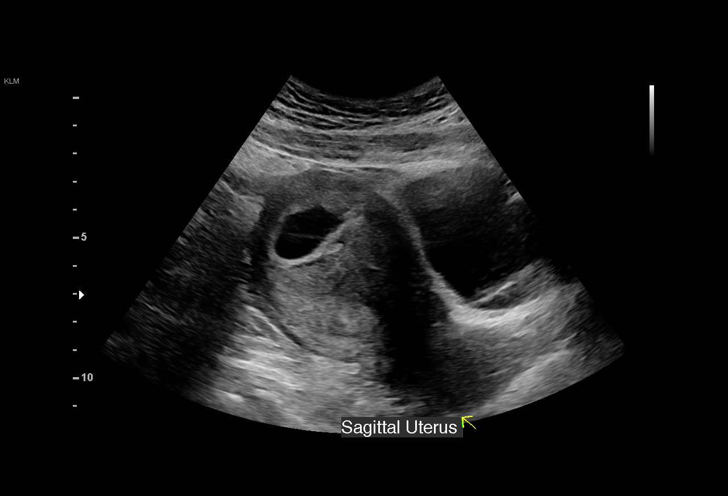
[im 3/36]
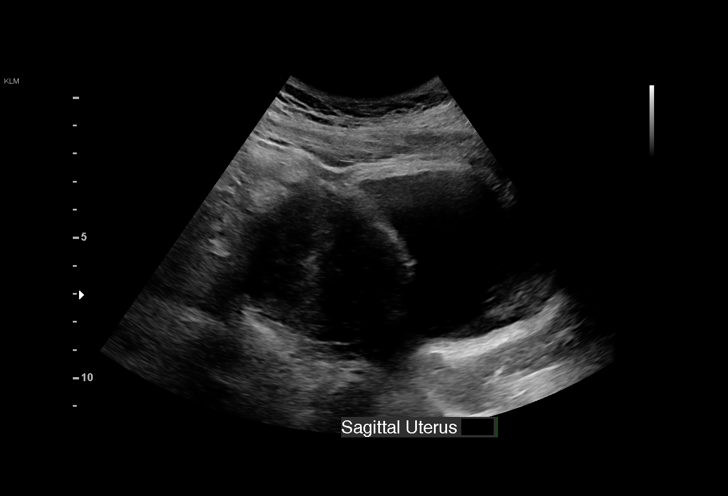
[im 6/36]
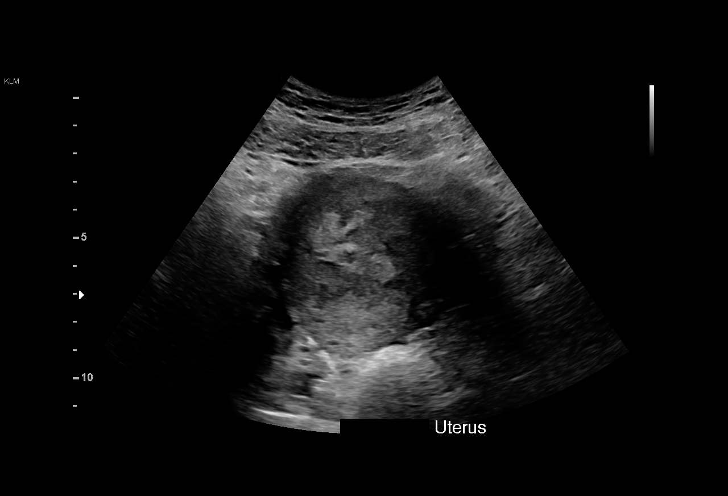
[im 8/36]
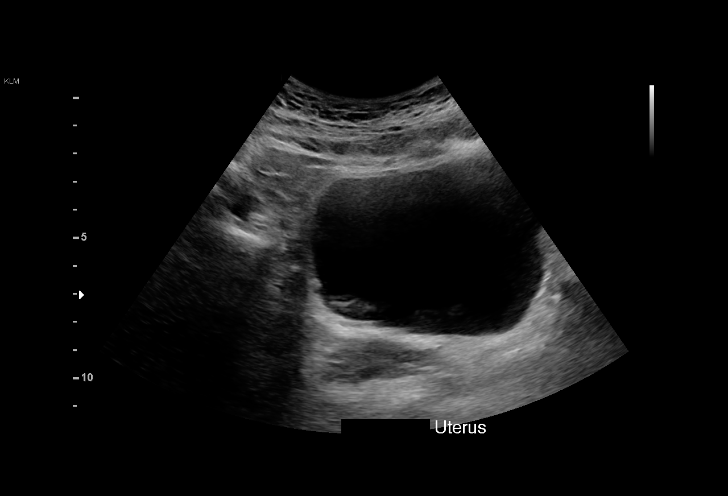
[im 11/36]
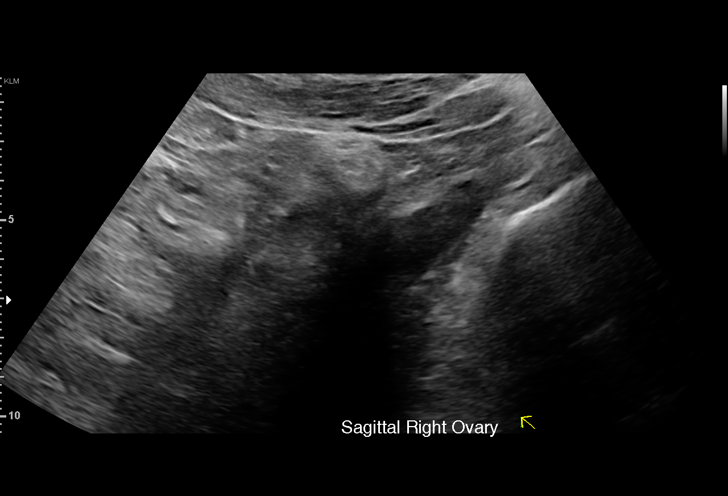
[im 13/36]
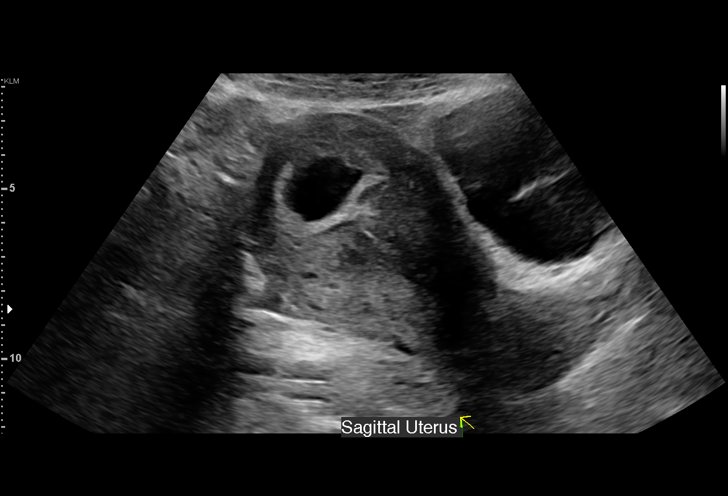
[im 16/36]
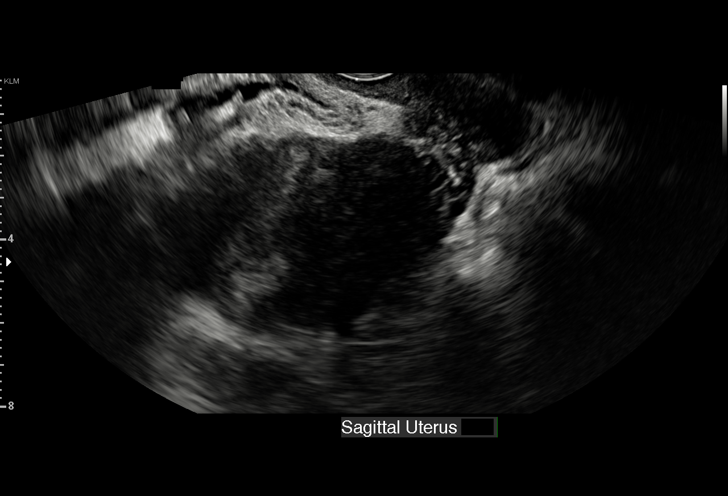
[im 19/36]
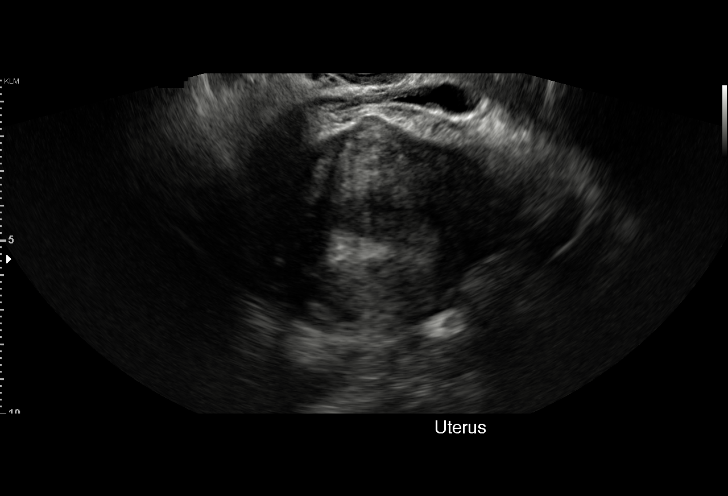
[im 20/36]
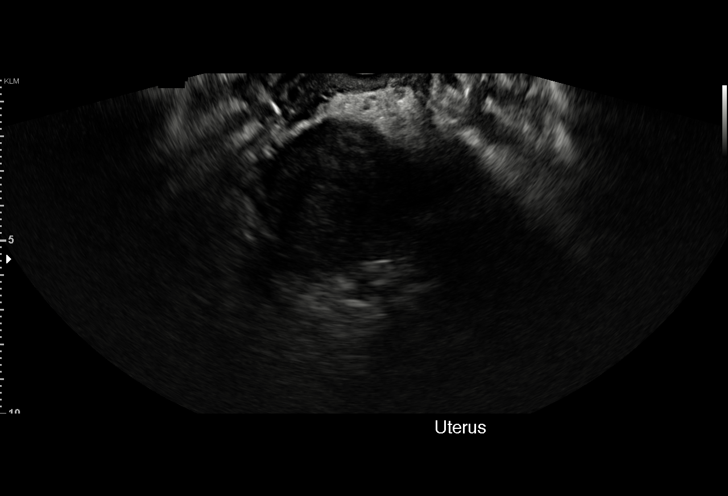
[im 23/36]
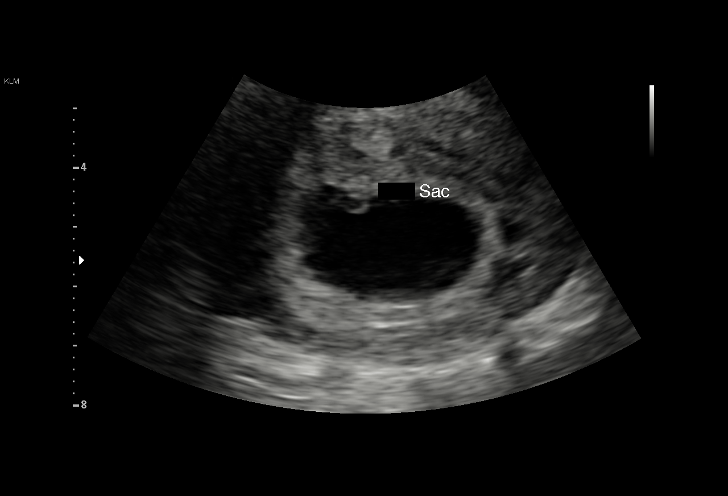
[im 25/36]
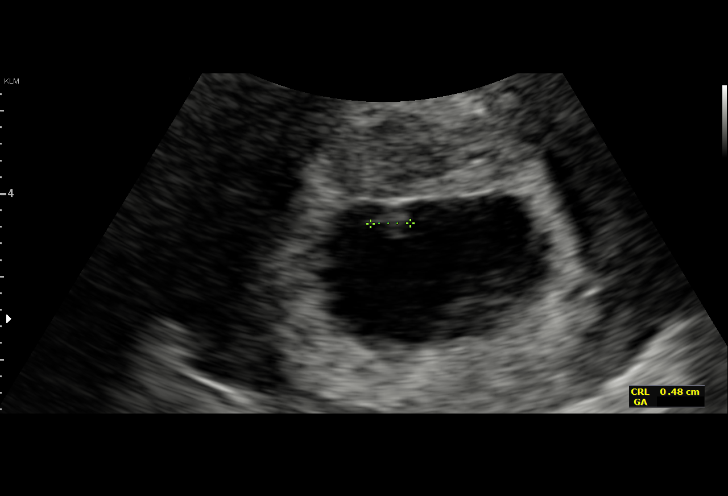
[im 28/36]
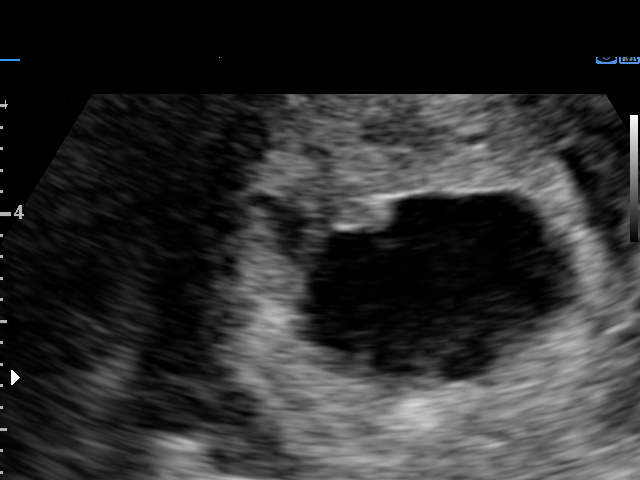
[im 30/36]
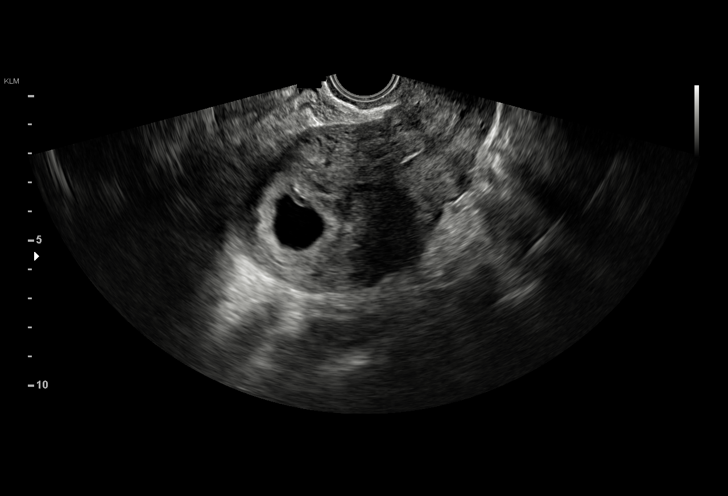
[im 33/36]
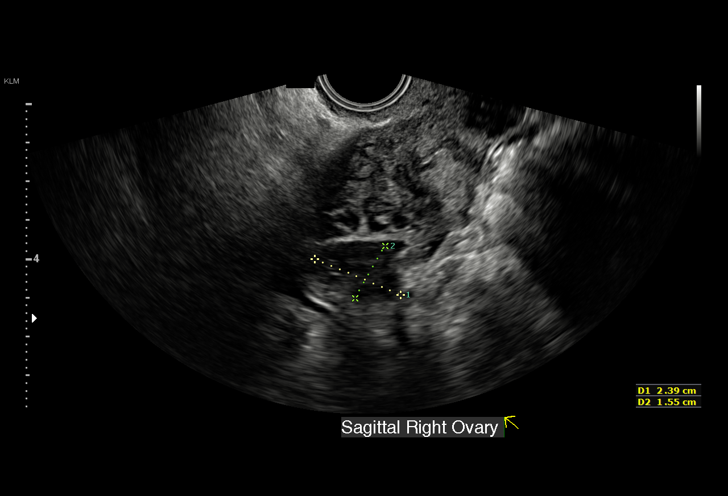
[im 36/36]
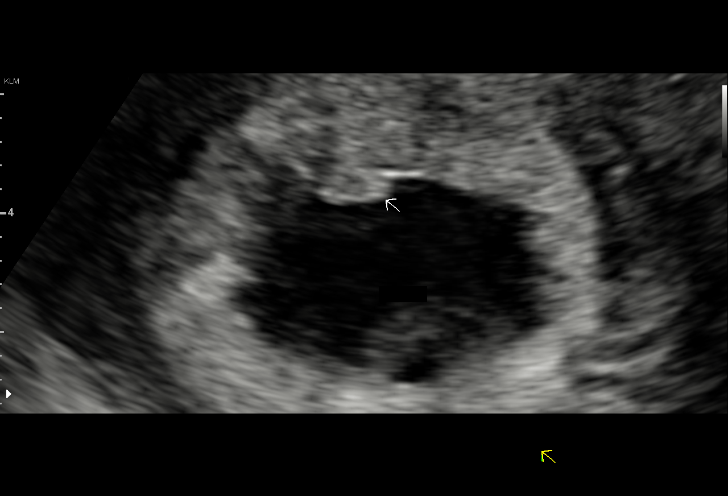

[15 of 28 positions shown; findings below may reference images not displayed]

FINDINGS: Intrauterine gestational sac: Single intrauterine gestational sac
appears normal in size, shape and position.

Yolk sac:  Visualized.

Embryo:  Visualized.

Embryonic Cardiac Activity: Visualized.

Embryonic Heart Rate: 111  bpm

CRL:  5.7  mm   6 w   2 d                  US EDC: 06/07/2017

Subchorionic hemorrhage:  None visualized.

Maternal uterus/adnexae: Anteverted uterus with no uterine fibroids.
Left ovary measures 3.5 x 2.1 x 1.6 cm and contains a corpus luteal
cyst. Right ovary measures 2.4 x 1.6 x 1.4 cm. No suspicious ovarian
or adnexal masses. No abnormal free fluid in the pelvis.
IMPRESSION: 1. Single living intrauterine gestation at 6 weeks 2 days by
crown-rump length, concordant with provided menstrual dating.
2. No acute early first-trimester gestational abnormality. Embryonic
heart rate 111 bpm, low normal at this early gestational age.
3. No ovarian or adnexal abnormality.

## 2019-08-03 ENCOUNTER — Other Ambulatory Visit: Payer: Self-pay

## 2019-08-03 ENCOUNTER — Encounter (HOSPITAL_COMMUNITY)
Admission: RE | Admit: 2019-08-03 | Discharge: 2019-08-03 | Disposition: A | Discharge status: Home / Self Care | Payer: Medicaid Other | Source: Ambulatory Visit | Attending: Obstetrics and Gynecology | Admitting: Obstetrics and Gynecology

## 2019-08-03 DIAGNOSIS — D500 Iron deficiency anemia secondary to blood loss (chronic): Secondary | ICD-10-CM | POA: Insufficient documentation

## 2019-08-03 MED ORDER — FERUMOXYTOL INJECTION 510 MG/17 ML
510.0000 mg | INTRAVENOUS | Status: DC
  Administered 2019-08-03: 13:00:00 510 mg via INTRAVENOUS
  Filled 2019-08-03: qty 17

## 2019-08-03 NOTE — Discharge Instructions (Signed)

## 2019-10-18 ENCOUNTER — Other Ambulatory Visit: Payer: Self-pay | Admitting: Obstetrics and Gynecology

## 2019-10-18 DIAGNOSIS — D649 Anemia, unspecified: Secondary | ICD-10-CM

## 2020-09-21 ENCOUNTER — Ambulatory Visit
Admission: EM | Admit: 2020-09-21 | Discharge: 2020-09-21 | Disposition: A | Discharge status: Home / Self Care | Payer: Medicaid Other | Attending: Internal Medicine | Admitting: Internal Medicine

## 2020-09-21 ENCOUNTER — Other Ambulatory Visit: Payer: Self-pay

## 2020-09-21 DIAGNOSIS — Z20822 Contact with and (suspected) exposure to covid-19: Secondary | ICD-10-CM

## 2020-09-21 DIAGNOSIS — J069 Acute upper respiratory infection, unspecified: Secondary | ICD-10-CM

## 2020-09-21 DIAGNOSIS — R0602 Shortness of breath: Secondary | ICD-10-CM

## 2020-09-21 MED ORDER — CETIRIZINE HCL 10 MG PO TABS: 10.0000 mg | ORAL_TABLET | Freq: Every day | ORAL | 0 refills | Status: DC

## 2020-09-21 MED ORDER — FLUTICASONE PROPIONATE 50 MCG/ACT NA SUSP: 1.0000 | Freq: Every day | NASAL | 0 refills | Status: DC

## 2020-09-21 MED ORDER — PREDNISONE 20 MG PO TABS: 40.0000 mg | ORAL_TABLET | Freq: Every day | ORAL | 0 refills | Status: AC

## 2020-09-21 MED ORDER — ALBUTEROL SULFATE HFA 108 (90 BASE) MCG/ACT IN AERS: 1.0000 | INHALATION_SPRAY | Freq: Four times a day (QID) | RESPIRATORY_TRACT | 0 refills | Status: DC | PRN

## 2020-09-21 NOTE — ED Provider Notes (Addendum)
[facility]    CSN: [id number] Arrival date & time: 09/21/20  1409      History   Chief Complaint Chief Complaint  Patient presents with   Cough    HPI OREATHA FABRY is a 35 y.o. female.   Patient presents with a nonproductive cough, nasal congestion, nausea without vomiting, intermittent shortness of breath, body aches that have been present for approximately 5 to 6 days.  Denies any known fevers.  Son has COVID-19.  Shortness breath is intermittent.  Denies any chest pain.  Denies any chronic lung disease such as asthma or COPD.  Has taken over-the-counter cough and cold medications with minimal relief of symptoms.   Cough  Past Medical History:  Diagnosis Date   Abnormal Pap smear    abn paps since 2000. no additional testing done.    Chlamydia 07/2010   treated. hasn't been with that partner since.    Gonorrhea    treated in July 2012. hasn't been with that partner since.    Vaginal Pap smear, abnormal     Patient Active Problem List   Diagnosis Date Noted   Elevated blood pressure reading 07/02/2019   Hyperlipidemia 01/30/2017   Obesity (BMI 30-39.9) 01/16/2017   Anemia 01/16/2017   Tinea corporis 01/16/2017   Postpartum depression 10/19/2015   Marijuana smoker 03/04/2015   Smoker 10/13/2012   Obesity, unspecified 10/13/2012    Past Surgical History:  Procedure Laterality Date   DILATION AND CURETTAGE OF UTERUS  12/2016   EAB done in [place]    OB History     Gravida  2   Para  1   Term  1   Preterm  0   AB  1   Living  1      SAB  0   IAB  1   Ectopic  0   Multiple  0   Live Births  1            Home Medications    Prior to Admission medications   Medication Sig Start Date End Date Taking? Authorizing Provider  albuterol (VENTOLIN HFA) 108 (90 Base) MCG/ACT inhaler Inhale 1-2 puffs into the lungs every 6 (six) hours as needed for wheezing or shortness of breath. 09/21/20  Yes Lance Muss, FNP   cetirizine (ZYRTEC) 10 MG tablet Take 1 tablet (10 mg total) by mouth daily. 09/21/20  Yes Lance Muss, FNP  fluticasone (FLONASE) 50 MCG/ACT nasal spray Place 1 spray into both nostrils daily. 09/21/20  Yes Lance Muss, FNP  predniSONE (DELTASONE) 20 MG tablet Take 2 tablets (40 mg total) by mouth daily for 5 days. 09/21/20 09/26/20 Yes Lance Muss, FNP  Clotrimazole 1 % OINT Apply to affected areas bid 01/16/17   [place] Bing, MD  metoCLOPramide (REGLAN) 10 MG tablet Take 0.5-1 tablets (5-10 mg total) by mouth 3 (three) times daily as needed for nausea. 10/14/16   Leftwich-Kirby, Wilmer Floor, CNM  metroNIDAZOLE (FLAGYL) 500 MG tablet Take 1 tablet (500 mg total) by mouth 2 (two) times daily. 07/08/19   [place] Bing, MD  Prenatal MV & Min w/FA-DHA (PRENATAL ADULT GUMMY/DHA/FA) 0.4-25 MG CHEW Chew 2 tablets by mouth daily.    [provider]    Family History Family History  Problem Relation Age of Onset   Hypertension Mother    Miscarriages / Stillbirths Sister    Hypertension Sister    Stroke Maternal Grandmother    Diabetes Maternal Grandmother  Social History Social History   Tobacco Use   Smoking status: Former    Packs/day: 0.15    Types: Cigarettes    Quit date: 01/01/2015    Years since quitting: 5.7   Smokeless tobacco: Never  Substance Use Topics   Alcohol use: No   Drug use: No     Allergies   Patient has no known allergies.   Review of Systems Review of Systems Per HPI  Physical Exam Triage Vital Signs ED Triage Vitals [09/21/20 1523]  Enc Vitals Group     BP 125/81     Pulse Rate 88     Resp 18     Temp 98.3 F (36.8 C)     Temp Source Oral     SpO2 98 %     Weight      Height      Head Circumference      Peak Flow      Pain Score 8     Pain Loc      Pain Edu?      Excl. in GC?    No data found.  Updated Vital Signs BP 125/81 (BP Location: Left Arm)   Pulse 88   Temp 98.3 F (36.8 C) (Oral)   Resp 18   SpO2 98%    Visual Acuity Right Eye Distance:   Left Eye Distance:   Bilateral Distance:    Right Eye Near:   Left Eye Near:    Bilateral Near:     Physical Exam Constitutional:      General: She is not in acute distress.    Appearance: Normal appearance. She is not ill-appearing, toxic-appearing or diaphoretic.  HENT:     Head: Normocephalic and atraumatic.     Right Ear: Ear canal normal. A middle ear effusion is present. Tympanic membrane is not erythematous or bulging.     Left Ear: Ear canal normal. A middle ear effusion is present. Tympanic membrane is not erythematous or bulging.     Nose: Congestion present.     Mouth/Throat:     Mouth: Mucous membranes are moist.     Pharynx: No posterior oropharyngeal erythema.  Eyes:     Extraocular Movements: Extraocular movements intact.     Conjunctiva/sclera: Conjunctivae normal.     Pupils: Pupils are equal, round, and reactive to light.  Cardiovascular:     Rate and Rhythm: Normal rate and regular rhythm.     Pulses: Normal pulses.     Heart sounds: Normal heart sounds.  Pulmonary:     Effort: Pulmonary effort is normal. No respiratory distress.     Breath sounds: Normal breath sounds. No stridor. No wheezing or rhonchi.  Abdominal:     General: Abdomen is flat. Bowel sounds are normal.     Palpations: Abdomen is soft.  Musculoskeletal:        General: Normal range of motion.     Cervical back: Normal range of motion.  Skin:    General: Skin is warm and dry.  Neurological:     General: No focal deficit present.     Mental Status: She is alert and oriented to person, place, and time. Mental status is at baseline.  Psychiatric:        Mood and Affect: Mood normal.        Behavior: Behavior normal.     UC Treatments / Results  Labs (all labs ordered are listed, but only abnormal results are displayed) Labs Reviewed  NOVEL CORONAVIRUS, NAA    EKG   Radiology No results found.  Procedures Procedures (including  critical care time)  Medications Ordered in UC Medications - No data to display  Initial Impression / Assessment and Plan / UC Course  I have reviewed the triage vital signs and the nursing notes.  Pertinent labs & imaging results that were available during my care of the patient were reviewed by me and considered in my medical decision making (see chart for details).     Patient presents with symptoms likely from a viral upper respiratory infection. Differential includes bacterial pneumonia, sinusitis, allergic rhinitis, Covid 19.  Highly suspicious of COVID-19 due to close exposure.  Do not suspect underlying cardiopulmonary process. Symptoms seem unlikely related to ACS, CHF or COPD exacerbations, pneumonia, pneumothorax. Patient is nontoxic appearing and not in need of emergent medical intervention.  Recommended symptom control with over the counter medications: Daily oral anti-histamine, Oral decongestant or IN corticosteroid, saline irrigations, cepacol lozenges, Robitussin, Delsym, honey tea.  Prescribed prednisone to decrease inflammation in chest and help with shortness of breath as well as albuterol inhaler.  Cetirizine and Flonase to help with middle ear effusion.  Do not think chest imaging is necessary at this time. Covid 19 viral swab pending.  Return if symptoms fail to improve in 1-2 weeks or you develop shortness of breath, chest pain, severe headache. Patient states understanding and is agreeable.  Discharged with PCP followup.  Final Clinical Impressions(s) / UC Diagnoses   Final diagnoses:  Viral upper respiratory tract infection with cough  Close exposure to COVID-19 virus  Shortness of breath     Discharge Instructions      COVID test is pending. we will call if it is positive.  You have been prescribed several medications to help with your symptoms.  Please go to the hospital if shortness of breath worsens.     ED Prescriptions     Medication Sig Dispense  Auth. Provider   predniSONE (DELTASONE) 20 MG tablet Take 2 tablets (40 mg total) by mouth daily for 5 days. 10 tablet Lance Muss, FNP   cetirizine (ZYRTEC) 10 MG tablet Take 1 tablet (10 mg total) by mouth daily. 30 tablet Lance Muss, FNP   fluticasone (FLONASE) 50 MCG/ACT nasal spray Place 1 spray into both nostrils daily. 16 g Lance Muss, FNP   albuterol (VENTOLIN HFA) 108 (90 Base) MCG/ACT inhaler Inhale 1-2 puffs into the lungs every 6 (six) hours as needed for wheezing or shortness of breath. 1 each Lance Muss, FNP      PDMP not reviewed this encounter.   Lance Muss, FNP 09/21/20 1608    Lance Muss, FNP 09/21/20 [phone]

## 2020-09-21 NOTE — Discharge Instructions (Addendum)
COVID test is pending. we will call if it is positive.  You have been prescribed several medications to help with your symptoms.  Please go to the hospital if shortness of breath worsens.

## 2020-09-21 NOTE — ED Triage Notes (Signed)
C/o cough, headache, chest congestion, nausea, diarrhea, body aches, chest is sore during deep breathing. Onset last Sunday. Child is COVID(+). Denies fever, vomiting.

## 2020-09-22 LAB — NOVEL CORONAVIRUS, NAA: SARS-CoV-2, NAA: DETECTED — AB

## 2020-09-22 LAB — SARS-COV-2, NAA 2 DAY TAT

## 2022-11-06 ENCOUNTER — Encounter (HOSPITAL_COMMUNITY): Payer: Self-pay | Admitting: Emergency Medicine

## 2022-11-06 ENCOUNTER — Emergency Department (HOSPITAL_COMMUNITY): Payer: Medicaid Other

## 2022-11-06 ENCOUNTER — Other Ambulatory Visit: Payer: Self-pay

## 2022-11-06 ENCOUNTER — Inpatient Hospital Stay (HOSPITAL_COMMUNITY)
Admission: EM | Admit: 2022-11-06 | Discharge: 2022-11-08 | DRG: 812 | Disposition: A | Discharge status: Home / Self Care | Payer: Medicaid Other | Attending: Internal Medicine | Admitting: Internal Medicine

## 2022-11-06 ENCOUNTER — Observation Stay (HOSPITAL_COMMUNITY): Payer: Medicaid Other

## 2022-11-06 DIAGNOSIS — I517 Cardiomegaly: Secondary | ICD-10-CM | POA: Diagnosis present

## 2022-11-06 DIAGNOSIS — R9431 Abnormal electrocardiogram [ECG] [EKG]: Secondary | ICD-10-CM | POA: Diagnosis present

## 2022-11-06 DIAGNOSIS — Z6834 Body mass index (BMI) 34.0-34.9, adult: Secondary | ICD-10-CM

## 2022-11-06 DIAGNOSIS — E8809 Other disorders of plasma-protein metabolism, not elsewhere classified: Secondary | ICD-10-CM | POA: Diagnosis present

## 2022-11-06 DIAGNOSIS — E669 Obesity, unspecified: Secondary | ICD-10-CM | POA: Diagnosis present

## 2022-11-06 DIAGNOSIS — Z720 Tobacco use: Secondary | ICD-10-CM | POA: Diagnosis present

## 2022-11-06 DIAGNOSIS — D649 Anemia, unspecified: Principal | ICD-10-CM | POA: Diagnosis present

## 2022-11-06 DIAGNOSIS — Z8616 Personal history of COVID-19: Secondary | ICD-10-CM

## 2022-11-06 DIAGNOSIS — D696 Thrombocytopenia, unspecified: Secondary | ICD-10-CM | POA: Diagnosis present

## 2022-11-06 DIAGNOSIS — E876 Hypokalemia: Secondary | ICD-10-CM | POA: Diagnosis present

## 2022-11-06 DIAGNOSIS — Z79899 Other long term (current) drug therapy: Secondary | ICD-10-CM

## 2022-11-06 DIAGNOSIS — I340 Nonrheumatic mitral (valve) insufficiency: Secondary | ICD-10-CM

## 2022-11-06 DIAGNOSIS — E785 Hyperlipidemia, unspecified: Secondary | ICD-10-CM | POA: Diagnosis present

## 2022-11-06 DIAGNOSIS — N920 Excessive and frequent menstruation with regular cycle: Secondary | ICD-10-CM | POA: Diagnosis present

## 2022-11-06 DIAGNOSIS — D250 Submucous leiomyoma of uterus: Secondary | ICD-10-CM | POA: Diagnosis present

## 2022-11-06 DIAGNOSIS — Z8249 Family history of ischemic heart disease and other diseases of the circulatory system: Secondary | ICD-10-CM

## 2022-11-06 DIAGNOSIS — E66811 Obesity, class 1: Secondary | ICD-10-CM | POA: Diagnosis present

## 2022-11-06 DIAGNOSIS — Z87891 Personal history of nicotine dependence: Secondary | ICD-10-CM

## 2022-11-06 HISTORY — DX: Nonrheumatic mitral (valve) insufficiency: I34.0

## 2022-11-06 LAB — CBC WITH DIFFERENTIAL/PLATELET
Abs Immature Granulocytes: 0.02 10*3/uL (ref 0.00–0.07)
Basophils Absolute: 0 10*3/uL (ref 0.0–0.1)
Basophils Relative: 0 %
Eosinophils Absolute: 0.1 10*3/uL (ref 0.0–0.5)
Eosinophils Relative: 2 %
HCT: 9.8 % — ABNORMAL LOW (ref 36.0–46.0)
Hemoglobin: 2.6 g/dL — CL (ref 12.0–15.0)
Immature Granulocytes: 1 %
Lymphocytes Relative: 21 %
Lymphs Abs: 0.9 10*3/uL (ref 0.7–4.0)
MCH: 19 pg — ABNORMAL LOW (ref 26.0–34.0)
MCHC: 26.5 g/dL — ABNORMAL LOW (ref 30.0–36.0)
MCV: 71.5 fL — ABNORMAL LOW (ref 80.0–100.0)
Monocytes Absolute: 0.3 10*3/uL (ref 0.1–1.0)
Monocytes Relative: 8 %
Neutro Abs: 2.8 10*3/uL (ref 1.7–7.7)
Neutrophils Relative %: 68 %
Platelets: 117 10*3/uL — ABNORMAL LOW (ref 150–400)
RBC: 1.37 MIL/uL — ABNORMAL LOW (ref 3.87–5.11)
RDW: 21.2 % — ABNORMAL HIGH (ref 11.5–15.5)
WBC: 4.1 10*3/uL (ref 4.0–10.5)
nRBC: 0 % (ref 0.0–0.2)

## 2022-11-06 LAB — D-DIMER, QUANTITATIVE: D-Dimer, Quant: 1.01 ug{FEU}/mL — ABNORMAL HIGH (ref 0.00–0.50)

## 2022-11-06 LAB — COMPREHENSIVE METABOLIC PANEL
ALT: 17 U/L (ref 0–44)
AST: 27 U/L (ref 15–41)
Albumin: 3.4 g/dL — ABNORMAL LOW (ref 3.5–5.0)
Alkaline Phosphatase: 66 U/L (ref 38–126)
Anion gap: 6 (ref 5–15)
BUN: <5 mg/dL — ABNORMAL LOW (ref 6–20)
CO2: 22 mmol/L (ref 22–32)
Calcium: 8.2 mg/dL — ABNORMAL LOW (ref 8.9–10.3)
Chloride: 110 mmol/L (ref 98–111)
Creatinine, Ser: 0.38 mg/dL — ABNORMAL LOW (ref 0.44–1.00)
GFR, Estimated: >60 mL/min (ref >60)
Glucose, Bld: 84 mg/dL (ref 70–99)
Potassium: 3.3 mmol/L — ABNORMAL LOW (ref 3.5–5.1)
Sodium: 138 mmol/L (ref 135–145)
Total Bilirubin: 0.7 mg/dL (ref 0.3–1.2)
Total Protein: 6.7 g/dL (ref 6.5–8.1)

## 2022-11-06 LAB — PREPARE RBC (CROSSMATCH)

## 2022-11-06 LAB — URINALYSIS, W/ REFLEX TO CULTURE (INFECTION SUSPECTED)
Bilirubin Urine: NEGATIVE
Glucose, UA: NEGATIVE mg/dL
Hgb urine dipstick: NEGATIVE
Ketones, ur: NEGATIVE mg/dL
Nitrite: NEGATIVE
Protein, ur: NEGATIVE mg/dL
Specific Gravity, Urine: 1.006 (ref 1.005–1.030)
pH: 7 (ref 5.0–8.0)

## 2022-11-06 LAB — PREGNANCY, URINE: Preg Test, Ur: NEGATIVE

## 2022-11-06 MED ORDER — SODIUM CHLORIDE 0.9% IV SOLUTION: Freq: Once | INTRAVENOUS | Status: AC

## 2022-11-06 MED ORDER — POTASSIUM CHLORIDE CRYS ER 20 MEQ PO TBCR
40.0000 meq | EXTENDED_RELEASE_TABLET | Freq: Once | ORAL | Status: AC
  Administered 2022-11-06: 40 meq via ORAL
  Filled 2022-11-06: qty 2

## 2022-11-06 MED ORDER — NORETHIN-ETH ESTRADIOL-FE 0.4-35 MG-MCG PO CHEW
1.0000 | CHEWABLE_TABLET | Freq: Every day | ORAL | Status: DC
Start: 2022-11-06 — End: 2022-11-08

## 2022-11-06 MED ORDER — ACETAMINOPHEN 650 MG RE SUPP: 650.0000 mg | Freq: Four times a day (QID) | RECTAL | Status: DC | PRN

## 2022-11-06 MED ORDER — ACETAMINOPHEN 325 MG PO TABS: 650.0000 mg | ORAL_TABLET | Freq: Four times a day (QID) | ORAL | Status: DC | PRN

## 2022-11-06 MED ORDER — NICOTINE 14 MG/24HR TD PT24: 14.0000 mg | MEDICATED_PATCH | Freq: Every day | TRANSDERMAL | Status: DC | PRN

## 2022-11-06 MED ORDER — ONDANSETRON HCL 4 MG PO TABS: 4.0000 mg | ORAL_TABLET | Freq: Four times a day (QID) | ORAL | Status: DC | PRN

## 2022-11-06 MED ORDER — ONDANSETRON HCL 4 MG/2ML IJ SOLN: 4.0000 mg | Freq: Four times a day (QID) | INTRAMUSCULAR | Status: DC | PRN

## 2022-11-06 NOTE — ED Notes (Signed)
CRITICAL VALUE STICKER  CRITICAL VALUE: HGB 2.6  RECEIVER (on-site recipient of call): Zach T.  DATE & TIME NOTIFIED: 11/06/2022  MESSENGER (representative from lab):  MD NOTIFIED: MD Belfi  TIME OF NOTIFICATION: 1042  RESPONSE: Orders recieved

## 2022-11-06 NOTE — ED Triage Notes (Signed)
Patient arrives ambulatory by POV c/o upper chest pain and soreness in chest since having covid end of August and beginning of September. Patient concerned for pneumonia. States she symptoms worsen when walking. Reports dry cough and feeling like her heart is racing. Patient also adds that her menstrual cycle is now longer and heavier.

## 2022-11-06 NOTE — ED Provider Notes (Signed)
[place] EMERGENCY DEPARTMENT AT [facility] Provider Note   CSN: [id number] Arrival date & time: 11/06/22  4098     History  Chief Complaint  Patient presents with   Chest Pain    Darlene Hayes is a 37 y.o. female.  Patient is a 37 year old female who presents with palpitations and chest pain.  She states over the last year she has had issues with feeling like her heart is beating fast.  With any activity she says it beats fast.  She is also been having heavy periods for the last year.  She feels fatigued with minimal activity.  She did have COVID in August and since that time she feels like her symptoms have been worse.  She does have an ongoing cough although its mostly nonproductive.  No recent fevers.  She periodically will have chest pain which she describes as a burning pain in the center of her chest/throat and worse with coughing.  No leg pain or swelling.  No vomiting or diarrhea.  She does get lightheaded when she walks around.  She has a prior history of anemia and is supposed to be taking iron supplements but does not take them regularly.  She currently does not have a primary care doctor.       Home Medications Prior to Admission medications   Medication Sig Start Date End Date Taking? Authorizing Provider  albuterol (VENTOLIN HFA) 108 (90 Base) MCG/ACT inhaler Inhale 1-2 puffs into the lungs every 6 (six) hours as needed for wheezing or shortness of breath. 09/21/20   Gustavus Bryant, FNP  cetirizine (ZYRTEC) 10 MG tablet Take 1 tablet (10 mg total) by mouth daily. 09/21/20   Gustavus Bryant, FNP  Clotrimazole 1 % OINT Apply to affected areas bid 01/16/17   Parole Bing, MD  fluticasone [facility]) 50 MCG/ACT nasal spray Place 1 spray into both nostrils daily. 09/21/20   Gustavus Bryant, FNP  metoCLOPramide (REGLAN) 10 MG tablet Take 0.5-1 tablets (5-10 mg total) by mouth 3 (three) times daily as needed for nausea. 10/14/16   Leftwich-Kirby, Wilmer Floor, CNM   metroNIDAZOLE (FLAGYL) 500 MG tablet Take 1 tablet (500 mg total) by mouth 2 (two) times daily. 07/08/19   Haysi Bing, MD  Prenatal MV & Min w/FA-DHA (PRENATAL ADULT GUMMY/DHA/FA) 0.4-25 MG CHEW Chew 2 tablets by mouth daily.    [provider]      Allergies    Patient has no known allergies.    Review of Systems   Review of Systems  Constitutional:  Positive for fatigue. Negative for chills, diaphoresis and fever.  HENT:  Negative for congestion, rhinorrhea and sneezing.   Eyes: Negative.   Respiratory:  Positive for cough. Negative for chest tightness and shortness of breath.   Cardiovascular:  Positive for chest pain and palpitations. Negative for leg swelling.  Gastrointestinal:  Negative for abdominal pain, blood in stool, diarrhea, nausea and vomiting.  Genitourinary:  Negative for difficulty urinating, flank pain, frequency and hematuria.  Musculoskeletal:  Negative for arthralgias and back pain.  Skin:  Negative for rash.  Neurological:  Positive for light-headedness. Negative for speech difficulty, weakness, numbness and headaches.    Physical Exam Updated Vital Signs BP 116/72   Pulse 75   Temp 98.4 F (36.9 C) (Oral)   Resp 15   Ht 5\' 5"  (1.651 m)   Wt 94.3 kg   LMP 10/19/2022   SpO2 100%   BMI 34.61 kg/m  Physical Exam  Constitutional:      Appearance: She is well-developed.  HENT:     Head: Normocephalic and atraumatic.  Eyes:     Pupils: Pupils are equal, round, and reactive to light.  Cardiovascular:     Rate and Rhythm: Normal rate and regular rhythm.     Heart sounds: Normal heart sounds.  Pulmonary:     Effort: Pulmonary effort is normal. No respiratory distress.     Breath sounds: Normal breath sounds. No wheezing or rales.  Chest:     Chest wall: No tenderness.  Abdominal:     General: Bowel sounds are normal.     Palpations: Abdomen is soft.     Tenderness: There is no abdominal tenderness. There is no guarding or rebound.   Musculoskeletal:        General: Normal range of motion.     Cervical back: Normal range of motion and neck supple.     Comments: No edema or calf tenderness  Lymphadenopathy:     Cervical: No cervical adenopathy.  Skin:    General: Skin is warm and dry.     Findings: No rash.  Neurological:     Mental Status: She is alert and oriented to person, place, and time.     ED Results / Procedures / Treatments   Labs (all labs ordered are listed, but only abnormal results are displayed) Labs Reviewed  URINALYSIS, W/ REFLEX TO CULTURE (INFECTION SUSPECTED) - Abnormal; Notable for the following components:      Result Value   APPearance CLOUDY (*)    Leukocytes,Ua MODERATE (*)    Bacteria, UA FEW (*)    All other components within normal limits  CBC WITH DIFFERENTIAL/PLATELET - Abnormal; Notable for the following components:   RBC 1.37 (*)    Hemoglobin 2.6 (*)    HCT 9.8 (*)    MCV 71.5 (*)    MCH 19.0 (*)    MCHC 26.5 (*)    RDW 21.2 (*)    Platelets 117 (*)    All other components within normal limits  COMPREHENSIVE METABOLIC PANEL - Abnormal; Notable for the following components:   Potassium 3.3 (*)    BUN <5 (*)    Creatinine, Ser 0.38 (*)    Calcium 8.2 (*)    Albumin 3.4 (*)    All other components within normal limits  D-DIMER, QUANTITATIVE (NOT AT [facility]) - Abnormal; Notable for the following components:   D-Dimer, Quant 1.01 (*)    All other components within normal limits  PREGNANCY, URINE  D-DIMER, QUANTITATIVE  CBC WITH DIFFERENTIAL/PLATELET  IRON AND TIBC  FERRITIN  TYPE AND SCREEN  PREPARE RBC (CROSSMATCH)    EKG EKG Interpretation Date/Time:  Wednesday November 06 2022 07:57:31 EDT Ventricular Rate:  113 PR Interval:  122 QRS Duration:  73 QT Interval:  329 QTC Calculation: 452 R Axis:   46  Text Interpretation: Sinus tachycardia ** Nonstandard lead placement, ECG interpretation not available ** Abnormal R-wave progression, early transition  Borderline repolarization abnormality No old tracing to compare Confirmed by Rolan Bucco [phone]) on 11/06/2022 8:46:32 AM  Radiology DG Chest 2 View  Result Date: 11/06/2022 CLINICAL DATA:  37 year old female with cough, chest pain status post COVID-19. In August. Palpitations. EXAM: CHEST - 2 VIEW COMPARISON:  None Available. FINDINGS: PA and lateral views 1013 hours. Smaller trace layering pleural effusions in the posterior costophrenic sulci. Mild cardiomegaly. Other mediastinal contours are within normal limits. Visualized tracheal air column is within normal  limits. No consolidation. No pneumothorax. Mild increased pulmonary interstitium but no overt edema. No confluent opacity. No acute osseous abnormality identified. Negative visible bowel gas. IMPRESSION: 1. Borderline to mild cardiomegaly with trace pleural effusions. 2. Pulmonary vascularity appears increased but no overt edema. Viral/atypical respiratory infection felt less likely. Electronically Signed   By: Odessa Fleming M.D.   On: 11/06/2022 10:21    Procedures Procedures    Medications Ordered in ED Medications  0.9 %  sodium chloride infusion (Manually program via Guardrails IV Fluids) (has no administration in time range)  acetaminophen (TYLENOL) tablet 650 mg (has no administration in time range)    Or  acetaminophen (TYLENOL) suppository 650 mg (has no administration in time range)  ondansetron (ZOFRAN) tablet 4 mg (has no administration in time range)    Or  ondansetron (ZOFRAN) injection 4 mg (has no administration in time range)  potassium chloride SA (KLOR-CON M) CR tablet 40 mEq (40 mEq Oral Given 11/06/22 1301)    ED Course/ Medical Decision Making/ A&P                                 Medical Decision Making Amount and/or Complexity of Data Reviewed Labs: ordered. Radiology: ordered.  Risk Prescription drug management. Decision regarding hospitalization.   Patient is a 37 year old who presents with  shortness of breath, tachycardia and fatigue that is worsening over the last few months.  On chart review, it does look like she has a history of anemia in the past with hemoglobin little over 6 and was getting iron transfusions that was arranged by her OB/GYN.  It was felt that this was likely related to her heavy menstrual cycles which she does say that she still has heavy menstrual cycles.  She did not follow-up with heme-onc.  Today her hemoglobin is 2.6.  She denies any GI bleeding.  Her EKG does not show any ischemic changes.  She was typed and crossed for 2 units of packed red cells.  Will plan admission.  I spoke with Dr. Robb Matar who will admit the patient for further treatment.  CRITICAL CARE Performed by: Rolan Bucco Total critical care time: 60 minutes Critical care time was exclusive of separately billable procedures and treating other patients. Critical care was necessary to treat or prevent imminent or life-threatening deterioration. Critical care was time spent personally by me on the following activities: development of treatment plan with patient and/or surrogate as well as nursing, discussions with consultants, evaluation of patient's response to treatment, examination of patient, obtaining history from patient or surrogate, ordering and performing treatments and interventions, ordering and review of laboratory studies, ordering and review of radiographic studies, pulse oximetry and re-evaluation of patient's condition.   Final Clinical Impression(s) / ED Diagnoses Final diagnoses:  Anemia, unspecified type    Rx / DC Orders ED Discharge Orders     None         Rolan Bucco, MD 11/06/22 1331

## 2022-11-06 NOTE — ED Notes (Signed)
Pt does not display any signs of a reaction at this time.

## 2022-11-06 NOTE — H&P (Signed)
History and Physical    Patient: Darlene Hayes UKG:[id number] DOB: [birth year] DOA: 11/06/2022 DOS: the patient was seen and examined on 11/06/2022 PCP: Patient, No Pcp Per  Patient coming from: Home  Chief Complaint:  Chief Complaint  Patient presents with   Chest Pain   HPI: Darlene Hayes is a 37 y.o. female with medical history significant of abnormal Pap smear, chlamydia, gonorrhea, class I obesity, progressively worse menorrhagia now lasting about 2 weeks/month, requiring large amount of pads, the patient stated that on her previous period she also had to use a diaper due to the large amount of blood.  She usually gets some decreased of dysmenorrhea, but stated that pain has not being out of the ordinary.  However, for the last few days, she has had progressively worse dyspnea associated with chest tightness when she exerts.  She denied fever, chills, rhinorrhea, sore throat, wheezing or hemoptysis.  No chest pain, palpitations, diaphoresis, PND, orthopnea or pitting edema of the lower extremities.  No abdominal pain, nausea, emesis, diarrhea, constipation, melena or hematochezia.  No flank pain, dysuria, frequency or hematuria.  No polyuria, polydipsia, polyphagia or blurred vision.   Lab work: Urinalysis was cloudy with moderate leukocyte esterase and few bacteria.  CBC showed a white count of 4.1, hemoglobin 2.6 g/dL platelets 762.  D-dimer was 1.01.  CMP showed an albumin of 3.4 g/dL, a potassium of 3.3 mmol/L, BUN less than 5, creatinine 0.38 and corrected calcium 8.8 mg/dL.  The rest of the LFTs and electrolytes were normal.  Imaging: 2 view chest radiograph with borderline to mild cardiomegaly with trace pleural effusions.  Pulmonary vascularity appears increased but no overt edema.  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  Endometrium mildly distorted by fibroids,  otherwise negative.  Normal ovaries.  ED course: Initial vital signs were temperature 98.5 F, pulse 79, respirations 16, BP 146/72 mmHg O2 sat 100% on room air.  The patient received a 2 unit blood transfusion in the emergency department.   Review of Systems: As mentioned in the history of present illness. All other systems reviewed and are negative. Past Medical History:  Diagnosis Date   Abnormal Pap smear    abn paps since 2000. no additional testing done.    Chlamydia 07/2010   treated. hasn't been with that partner since.    Gonorrhea    treated in July 2012. hasn't been with that partner since.    Vaginal Pap smear, abnormal    Past Surgical History:  Procedure Laterality Date   DILATION AND CURETTAGE OF UTERUS  12/2016   EAB done in [place]   Social History:  reports that she quit smoking about 7 years ago. Her smoking use included cigarettes. She has never used smokeless tobacco. She reports that she does not drink alcohol and does not use drugs.  No Known Allergies  Family History  Problem Relation Age of Onset   Hypertension Mother    Miscarriages / Stillbirths Sister    Hypertension Sister    Stroke Maternal Grandmother    Diabetes Maternal Grandmother     Prior to Admission medications   Medication Sig Start Date End Date Taking? Authorizing Provider  albuterol (VENTOLIN HFA) 108 (90 Base) MCG/ACT inhaler Inhale 1-2 puffs into the lungs every 6 (six) hours as needed for wheezing or shortness of breath. 09/21/20   Gustavus Bryant, FNP  cetirizine (ZYRTEC) 10  MG tablet Take 1 tablet (10 mg total) by mouth daily. 09/21/20   Gustavus Bryant, FNP  Clotrimazole 1 % OINT Apply to affected areas bid 01/16/17   Greer Bing, MD  fluticasone [facility]) 50 MCG/ACT nasal spray Place 1 spray into both nostrils daily. 09/21/20   Gustavus Bryant, FNP  metoCLOPramide (REGLAN) 10 MG tablet Take 0.5-1 tablets (5-10 mg total) by mouth 3 (three) times daily as needed for nausea. 10/14/16    Leftwich-Kirby, Wilmer Floor, CNM  metroNIDAZOLE (FLAGYL) 500 MG tablet Take 1 tablet (500 mg total) by mouth 2 (two) times daily. 07/08/19   [place] Bing, MD  Prenatal MV & Min w/FA-DHA (PRENATAL ADULT GUMMY/DHA/FA) 0.4-25 MG CHEW Chew 2 tablets by mouth daily.    [provider]    Physical Exam: Vitals:   11/06/22 0756 11/06/22 0757 11/06/22 0900  BP:  (!) 146/72 118/74  Pulse:  (!) 108 92  Resp:  16 20  Temp:  98.5 F (36.9 C)   TempSrc:  Oral   SpO2:  100% 100%  Weight: 94.3 kg    Height: 5\' 5"  (1.651 m)     Physical Exam Vitals and nursing note reviewed.  Constitutional:      General: She is awake. She is not in acute distress.    Appearance: She is well-developed. She is obese. She is ill-appearing.  HENT:     Head: Normocephalic.     Nose: No rhinorrhea.     Mouth/Throat:     Mouth: Mucous membranes are moist.  Eyes:     General: No scleral icterus.    Pupils: Pupils are equal, round, and reactive to light.  Neck:     Vascular: No JVD.  Cardiovascular:     Rate and Rhythm: Normal rate and regular rhythm.     Heart sounds: S1 normal and S2 normal.  Pulmonary:     Breath sounds: No decreased breath sounds, wheezing, rhonchi or rales.  Abdominal:     General: Bowel sounds are normal.     Palpations: Abdomen is soft.     Tenderness: There is no abdominal tenderness.  Musculoskeletal:     Cervical back: Neck supple.     Right lower leg: No edema.     Left lower leg: No edema.  Skin:    General: Skin is warm and dry.     Coloration: Skin is pale.  Neurological:     General: No focal deficit present.     Mental Status: She is alert and oriented to person, place, and time.  Psychiatric:        Mood and Affect: Mood normal.        Behavior: Behavior normal. Behavior is cooperative.     Data Reviewed:  Results are pending, will review when available. EKG: rate 113 BPM PR interval 122 ms QRS duration 73 ms QT/QTcB 329/452 ms P-R-T axes 57 46  4 Sinus tachycardia  Nonstandard lead placement, ECG interpretation not available Abnormal R-wave progression, early transition Borderline repolarization abnormality No old tracing to compare?  Assessment and Plan: Principal Problem:   Symptomatic anemia In the setting of longstanding:   Menorrhagia Observation/telemetry. Add on ferritin/iron studies to pretransfusion labs. Continue PRBC transfusion. Monitor H&H. Transfuse further as needed. Case discussed with GYN on-call (Dr. Vergie Living). -He recommended and already ordered OCP therapy. -He is making arrangements to follow her in the next week or 2.  Active Problems:   Cardiomegaly with abnormal EKG Secondary to profound anemia. Check  echocardiogram.    Obesity, unspecified Current BMI 34.61 kg/m. Lifestyle modifications. Follow-up with closely PCP and/or bariatric clinic.    Hyperlipidemia Currently not on medical therapy. Needs to establish with a primary care provider.    Hypokalemia Replacing. Follow potassium level.    Tobacco use Tobacco cessation advised. Nicotine replacement therapy ordered.    Advance Care Planning:   Code Status: Full Code   Consults:   Family Communication: Her mother was at bedside.  Severity of Illness: The appropriate patient status for this patient is OBSERVATION. Observation status is judged to be reasonable and necessary in order to provide the required intensity of service to ensure the patient's safety. The patient's presenting symptoms, physical exam findings, and initial radiographic and laboratory data in the context of their medical condition is felt to place them at decreased risk for further clinical deterioration. Furthermore, it is anticipated that the patient will be medically stable for discharge from the hospital within 2 midnights of admission.   Author: Bobette Mo, MD 11/06/2022 11:48 AM  For on call review [email or web address].   This document was prepared  using Dragon voice recognition software and may contain some unintended transcription errors.

## 2022-11-06 NOTE — ED Notes (Signed)
Phlebotomy called for assistance with labs.  

## 2022-11-07 ENCOUNTER — Ambulatory Visit: Payer: Medicaid Other | Admitting: Obstetrics and Gynecology

## 2022-11-07 ENCOUNTER — Observation Stay (HOSPITAL_COMMUNITY): Payer: Medicaid Other

## 2022-11-07 DIAGNOSIS — E876 Hypokalemia: Secondary | ICD-10-CM | POA: Diagnosis present

## 2022-11-07 DIAGNOSIS — R9431 Abnormal electrocardiogram [ECG] [EKG]: Secondary | ICD-10-CM | POA: Diagnosis present

## 2022-11-07 DIAGNOSIS — Z720 Tobacco use: Secondary | ICD-10-CM

## 2022-11-07 DIAGNOSIS — Z6834 Body mass index (BMI) 34.0-34.9, adult: Secondary | ICD-10-CM | POA: Diagnosis not present

## 2022-11-07 DIAGNOSIS — Z8249 Family history of ischemic heart disease and other diseases of the circulatory system: Secondary | ICD-10-CM | POA: Diagnosis not present

## 2022-11-07 DIAGNOSIS — I517 Cardiomegaly: Secondary | ICD-10-CM | POA: Diagnosis not present

## 2022-11-07 DIAGNOSIS — E785 Hyperlipidemia, unspecified: Secondary | ICD-10-CM | POA: Diagnosis present

## 2022-11-07 DIAGNOSIS — E8809 Other disorders of plasma-protein metabolism, not elsewhere classified: Secondary | ICD-10-CM | POA: Diagnosis present

## 2022-11-07 DIAGNOSIS — N920 Excessive and frequent menstruation with regular cycle: Secondary | ICD-10-CM

## 2022-11-07 DIAGNOSIS — D696 Thrombocytopenia, unspecified: Secondary | ICD-10-CM | POA: Diagnosis present

## 2022-11-07 DIAGNOSIS — D649 Anemia, unspecified: Secondary | ICD-10-CM | POA: Diagnosis present

## 2022-11-07 DIAGNOSIS — E66811 Obesity, class 1: Secondary | ICD-10-CM

## 2022-11-07 DIAGNOSIS — Z79899 Other long term (current) drug therapy: Secondary | ICD-10-CM | POA: Diagnosis not present

## 2022-11-07 DIAGNOSIS — Z8616 Personal history of COVID-19: Secondary | ICD-10-CM | POA: Diagnosis not present

## 2022-11-07 DIAGNOSIS — Z87891 Personal history of nicotine dependence: Secondary | ICD-10-CM | POA: Diagnosis not present

## 2022-11-07 DIAGNOSIS — D250 Submucous leiomyoma of uterus: Secondary | ICD-10-CM | POA: Diagnosis present

## 2022-11-07 DIAGNOSIS — I340 Nonrheumatic mitral (valve) insufficiency: Secondary | ICD-10-CM | POA: Diagnosis present

## 2022-11-07 LAB — CBC WITH DIFFERENTIAL/PLATELET
Abs Immature Granulocytes: 0.02 10*3/uL (ref 0.00–0.07)
Basophils Absolute: 0 10*3/uL (ref 0.0–0.1)
Basophils Relative: 1 %
Eosinophils Absolute: 0.2 10*3/uL (ref 0.0–0.5)
Eosinophils Relative: 3 %
HCT: 26 % — ABNORMAL LOW (ref 36.0–46.0)
Hemoglobin: 8.4 g/dL — ABNORMAL LOW (ref 12.0–15.0)
Immature Granulocytes: 0 %
Lymphocytes Relative: 25 %
Lymphs Abs: 1.4 10*3/uL (ref 0.7–4.0)
MCH: 26.3 pg (ref 26.0–34.0)
MCHC: 32.3 g/dL (ref 30.0–36.0)
MCV: 81.3 fL (ref 80.0–100.0)
Monocytes Absolute: 0.5 10*3/uL (ref 0.1–1.0)
Monocytes Relative: 9 %
Neutro Abs: 3.3 10*3/uL (ref 1.7–7.7)
Neutrophils Relative %: 62 %
Platelets: 123 10*3/uL — ABNORMAL LOW (ref 150–400)
RBC: 3.2 MIL/uL — ABNORMAL LOW (ref 3.87–5.11)
RDW: 19.9 % — ABNORMAL HIGH (ref 11.5–15.5)
WBC: 5.4 10*3/uL (ref 4.0–10.5)
nRBC: 0 % (ref 0.0–0.2)

## 2022-11-07 LAB — HEMOGLOBIN AND HEMATOCRIT, BLOOD
HCT: 18.5 % — ABNORMAL LOW (ref 36.0–46.0)
Hemoglobin: 5.7 g/dL — CL (ref 12.0–15.0)

## 2022-11-07 LAB — COMPREHENSIVE METABOLIC PANEL
ALT: 15 U/L (ref 0–44)
AST: 24 U/L (ref 15–41)
Albumin: 3.5 g/dL (ref 3.5–5.0)
Alkaline Phosphatase: 71 U/L (ref 38–126)
Anion gap: 7 (ref 5–15)
BUN: 8 mg/dL (ref 6–20)
CO2: 21 mmol/L — ABNORMAL LOW (ref 22–32)
Calcium: 8.5 mg/dL — ABNORMAL LOW (ref 8.9–10.3)
Chloride: 109 mmol/L (ref 98–111)
Creatinine, Ser: 0.68 mg/dL (ref 0.44–1.00)
GFR, Estimated: >60 mL/min (ref >60)
Glucose, Bld: 91 mg/dL (ref 70–99)
Potassium: 3.7 mmol/L (ref 3.5–5.1)
Sodium: 137 mmol/L (ref 135–145)
Total Bilirubin: 0.9 mg/dL (ref 0.3–1.2)
Total Protein: 7.1 g/dL (ref 6.5–8.1)

## 2022-11-07 LAB — PREPARE RBC (CROSSMATCH)

## 2022-11-07 LAB — ECHOCARDIOGRAM COMPLETE
AR max vel: 1.76 cm2
AV Peak grad: 16.3 mm[Hg]
Ao pk vel: 2.02 m/s
Area-P 1/2: 3.72 cm2
Height: 65 in
MV M vel: 5.43 m/s
MV Peak grad: 117.7 mm[Hg]
S' Lateral: 4.2 cm
Weight: 3328 [oz_av]

## 2022-11-07 LAB — FERRITIN: Ferritin: 2 ng/mL — ABNORMAL LOW (ref 11–307)

## 2022-11-07 LAB — PHOSPHORUS: Phosphorus: 3.8 mg/dL (ref 2.5–4.6)

## 2022-11-07 LAB — HIV ANTIBODY (ROUTINE TESTING W REFLEX): HIV Screen 4th Generation wRfx: NONREACTIVE

## 2022-11-07 LAB — IRON AND TIBC
Iron: 19 ug/dL — ABNORMAL LOW (ref 28–170)
Saturation Ratios: 3 % — ABNORMAL LOW (ref 10.4–31.8)
TIBC: 573 ug/dL — ABNORMAL HIGH (ref 250–450)
UIBC: 554 ug/dL

## 2022-11-07 LAB — MAGNESIUM: Magnesium: 2.4 mg/dL (ref 1.7–2.4)

## 2022-11-07 MED ORDER — SODIUM CHLORIDE 0.9% IV SOLUTION: Freq: Once | INTRAVENOUS | Status: DC

## 2022-11-07 MED ORDER — ORAL CARE MOUTH RINSE: 15.0000 mL | OROMUCOSAL | Status: DC | PRN

## 2022-11-07 NOTE — Plan of Care (Signed)
  Problem: Health Behavior/Discharge Planning: Goal: Ability to manage health-related needs will improve Outcome: Progressing   Problem: Clinical Measurements: Goal: Ability to maintain clinical measurements within normal limits will improve Outcome: Progressing Goal: Will remain free from infection Outcome: Progressing Goal: Diagnostic test results will improve Outcome: Progressing   Problem: Education: Goal: Knowledge of General Education information will improve Description: Including pain rating scale, medication(s)/side effects and non-pharmacologic comfort measures Outcome: Adequate for Discharge   Problem: Clinical Measurements: Goal: Respiratory complications will improve Outcome: Adequate for Discharge Goal: Cardiovascular complication will be avoided Outcome: Adequate for Discharge   Problem: Activity: Goal: Risk for activity intolerance will decrease Outcome: Adequate for Discharge   Problem: Nutrition: Goal: Adequate nutrition will be maintained Outcome: Adequate for Discharge   Problem: Coping: Goal: Level of anxiety will decrease Outcome: Adequate for Discharge   Problem: Elimination: Goal: Will not experience complications related to bowel motility Outcome: Adequate for Discharge Goal: Will not experience complications related to urinary retention Outcome: Adequate for Discharge   Problem: Pain Management: Goal: General experience of comfort will improve Outcome: Adequate for Discharge   Problem: Safety: Goal: Ability to remain free from injury will improve Outcome: Adequate for Discharge   Problem: Skin Integrity: Goal: Risk for impaired skin integrity will decrease Outcome: Adequate for Discharge

## 2022-11-07 NOTE — Progress Notes (Signed)
PROGRESS NOTE    Darlene Hayes  ZHY:[id number] DOB: [birth year] DOA: 11/06/2022 PCP: Patient, No Pcp Per   Brief Narrative:  The patient is a 37 year old obese African-American female with a past medical history significant for but not limited to abdominal Pap smear which history of chlamydia and gonorrhea and other comorbidities who has worsening menorrhagia that last about 2 weeks a month required a large amount of pads who presented with chest discomfort and dyspnea.  The patient states that she gets some abdominal pain due to dysmenorrhea but states the pain has not been out of ordinary hour for the last few days is progressively had worsening dyspnea associated with chest tightness when she exerts herself.  She denied any fevers or chills and no chest pain, diaphoresis or palpitation.  States that she has very heavy bleeding normally and sometimes even wears a diaper due to the large amount of blood.  Given her dyspnea associated chest tightness on exertion she presented to the ED for further workup and chest x-ray was done which showed borderline mild cardiomegaly with trace pleural effusions and pulmonary vascularity appear to be increased but no overt edema noted.  She underwent pelvic ultrasound as well and as below.  Given her symptoms she is found to have significant symptomatic anemia and the case was discussed with the OB/GYN who recommended starting her on OCP therapy and she was typed and screened and transfused 3 units of blood yesterday and will be getting 2 units of blood today.  Patient is to also get an echocardiogram done given her cardiomegaly.  Assessment and Plan:  Symptomatic Anemia in the setting of longstanding Menorrhagia  -Hgb/Hct Trend: Recent Labs  Lab 11/06/22 1002 11/07/22 0313  HGB 2.6* 5.7*  HCT 9.8* 18.5*  MCV 71.5*  --   -Anemia Panel was checked and showed an iron level of 19, UIBC 554, TIBC 573, saturation ratios of 3%, ferritin level 2 -Continue to  Monitor For S/Sx of Bleeding; No overt bleeding noted -Continue PRBC transfusion; s/p 3 units on 11/06/22 and will be getting an additional 2 units today. 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  The endometrium was also mildly distorted by fibroids and otherwise negative with normal ovaries. -Continue to Monitor H&H and Transfuse further as needed. -Case discussed with GYN on-call (Dr. Vergie Living) and He recommended and already ordered OCP therapy with Norethin-Eth Estrodiol-Fe tablet 1 tablet Daily -He is making arrangements to follow her in the next week or 2. -Repeat CBC in the AM  Hypokalemia -Patient's K+ Level Trend: Recent Labs  Lab 11/06/22 1002  K 3.3*  -Replete with with po KCL 40 mEQ x1 yesterday -Continue to Monitor and Replete as Necessary -Repeat CMP in the AM   Thrombocytopenia -Platelet Count Trend: Recent Labs  Lab 11/06/22 1002  PLT 117*  -Continue to Monitor for S/Sx of Bleeding; No overt bleeding noted -Repeat CBC in the AM   Cardiomegaly with abnormal EKG -Secondary to profound anemia. -Checking Echocardiogram.  Hyperlipidemia -Currently not on medical therapy. -Needs to establish with a primary care provider.   Tobacco Use -Tobacco cessation advised. -Nicotine replacement therapy ordered with Nicotine Patch 14 mg TD   Hypoalbuminemia -Patient's Albumin Trend: Recent Labs  Lab 11/06/22 1002  ALBUMIN 3.4*  -Continue to Monitor and Trend and repeat CMP in the AM  Obesity -Complicates overall prognosis  and care -Estimated body mass index is 34.61 kg/m as calculated from the following:   Height as of this encounter: 5\' 5"  (1.651 m).   Weight as of this encounter: 94.3 kg.  -Weight Loss and Dietary Counseling given and will need further lifestyle modifications and Follow-up with closely PCP and/or bariatric  clinic.   DVT prophylaxis: SCDs Start: 11/06/22 1154    Code Status: Full Code Family Communication: Discussed with family at bedside  Disposition Plan:  Level of care: Telemetry Status is: Observation The patient will require care spanning > 2 midnights and should be moved to inpatient because: Needs further workup and will be getting more blood and needs an echocardiogram   Consultants:  My colleague discussed with Dr. Vergie Living  Procedures:  As delineated as above  Antimicrobials:  Anti-infectives (From admission, onward)    None       Subjective: Seen and examined at bedside and states that she is feeling better and did not really have any more dyspnea.  Feels little bit stronger today.  States that she has had a very long standing heavy menstrual bleeding.  No nausea or vomiting and no other concerns or complaints at this time.  Objective: Vitals:   11/07/22 0554 11/07/22 0858 11/07/22 0915 11/07/22 1123  BP: (!) 170/100 (!) 140/65 119/73 136/72  Pulse: 73 63 83 (!) 52  Resp: 16 16 16 16   Temp: 98 F (36.7 C) 98.3 F (36.8 C) 97.9 F (36.6 C) 98.3 F (36.8 C)  TempSrc: Oral Oral Oral Oral  SpO2: 100% 100% 100% 100%  Weight:      Height:        Intake/Output Summary (Last 24 hours) at 11/07/2022 1224 Last data filed at 11/06/2022 2254 Gross per 24 hour  Intake 1168 ml  Output --  Net 1168 ml   Filed Weights   11/06/22 0756  Weight: 94.3 kg   Examination: Physical Exam:  Constitutional: WN/WD obese African-American female in no acute distress Respiratory: Diminished to auscultation bilaterally with some coarse breath sounds, no wheezing, rales, rhonchi or crackles. Normal respiratory effort and patient is not tachypenic. No accessory muscle use.  Unlabored breathing Cardiovascular: RRR, no murmurs / rubs / gallops. S1 and S2 auscultated. No extremity edema.  Abdomen: Soft, non-tender, distended secondary to body habitus. Bowel sounds positive.  GU:  Deferred. Musculoskeletal: No clubbing / cyanosis of digits/nails. No joint deformity upper and lower extremities.  Skin: No rashes, lesions, ulcers on limited skin evaluation. No induration; Warm and dry.  Neurologic: CN 2-12 grossly intact with no focal deficits. Romberg sign and cerebellar reflexes not assessed.  Psychiatric: Normal judgment and insight. Alert and oriented x 3. Normal mood and appropriate affect.   Data Reviewed: I have personally reviewed following labs and imaging studies  CBC: Recent Labs  Lab 11/06/22 1002 11/07/22 0313  WBC 4.1  --   NEUTROABS 2.8  --   HGB 2.6* 5.7*  HCT 9.8* 18.5*  MCV 71.5*  --   PLT 117*  --    Basic Metabolic Panel: Recent Labs  Lab 11/06/22 1002  NA 138  K 3.3*  CL 110  CO2 22  GLUCOSE 84  BUN <5*  CREATININE 0.38*  CALCIUM 8.2*   GFR: Estimated Creatinine Clearance: 108.2 mL/min (A) (by C-G formula based on SCr of 0.38 mg/dL (L)). Liver Function Tests: Recent Labs  Lab 11/06/22 1002  AST 27  ALT 17  ALKPHOS 66  BILITOT 0.7  PROT 6.7  ALBUMIN  3.4*   No results for input(s): "LIPASE", "AMYLASE" in the last 168 hours. No results for input(s): "AMMONIA" in the last 168 hours. Coagulation Profile: No results for input(s): "INR", "PROTIME" in the last 168 hours. Cardiac Enzymes: No results for input(s): "CKTOTAL", "CKMB", "CKMBINDEX", "TROPONINI" in the last 168 hours. BNP (last 3 results) No results for input(s): "PROBNP" in the last 8760 hours. HbA1C: No results for input(s): "HGBA1C" in the last 72 hours. CBG: No results for input(s): "GLUCAP" in the last 168 hours. Lipid Profile: No results for input(s): "CHOL", "HDL", "LDLCALC", "TRIG", "CHOLHDL", "LDLDIRECT" in the last 72 hours. Thyroid Function Tests: No results for input(s): "TSH", "T4TOTAL", "FREET4", "T3FREE", "THYROIDAB" in the last 72 hours. Anemia Panel: Recent Labs    11/06/22 0901  FERRITIN 2*  TIBC 573*  IRON 19*   Sepsis Labs: No  results for input(s): "PROCALCITON", "LATICACIDVEN" in the last 168 hours.  No results found for this or any previous visit (from the past 240 hour(s)).   Radiology Studies: US PELVIC COMPLETE WITH TRANSVAGINAL  Result Date: 11/06/2022 CLINICAL DATA:  37 year old female with cough, chest pain, palpitations. Dysfunctional uterine bleeding. LMP 10/19/2022. EXAM: TRANSABDOMINAL AND TRANSVAGINAL ULTRASOUND OF PELVIS TECHNIQUE: Both transabdominal and transvaginal ultrasound examinations of the pelvis were performed. Transabdominal technique was performed for global imaging of the pelvis including uterus, ovaries, adnexal regions, and pelvic cul-de-sac. It was necessary to proceed with endovaginal exam following the transabdominal exam to visualize the ovaries. COMPARISON:  Ob ultrasound 10/24/2016. FINDINGS: Uterus Measurements: 12.9 x 7.4 x 9.4 cm = volume: 468 mL. Multiple uterine fibroids with hypervascularity (image 97) ranging from 3.0 to 7.3 cm diameter (images 64 and 65. These generally appear to be intramural, and there is distortion of the endometrial contour (image 22) suggesting submucosal location of some lesions. Endometrium Thickness: 5 mm (image 122). Partially obscured by fibroid disease. No discrete endometrial lesion. Right ovary Only identified transabdominally. Measurements: 2.8 x 1.9 x 2.9 cm = volume: 8 mL. Normal appearance/no adnexal mass. Left ovary Only identified transabdominally. Measurements: 3.1 x 2.5 x 2.5 = volume: 10 mL. Normal appearance. Normal follicle (image 53 no follow-up imaging recommended), no adnexal mass. Other findings No free fluid. IMPRESSION: 1. Enlarged fibroid uterus. 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 2. Endometrium mildly distorted by fibroids, otherwise negative. 3. Normal ovaries. Electronically Signed   By: Odessa Fleming M.D.   On: 11/06/2022 16:39   DG Chest 2 View  Result Date: 11/06/2022 CLINICAL  DATA:  37 year old female with cough, chest pain status post COVID-19. In August. Palpitations. EXAM: CHEST - 2 VIEW COMPARISON:  None Available. FINDINGS: PA and lateral views 1013 hours. Smaller trace layering pleural effusions in the posterior costophrenic sulci. Mild cardiomegaly. Other mediastinal contours are within normal limits. Visualized tracheal air column is within normal limits. No consolidation. No pneumothorax. Mild increased pulmonary interstitium but no overt edema. No confluent opacity. No acute osseous abnormality identified. Negative visible bowel gas. IMPRESSION: 1. Borderline to mild cardiomegaly with trace pleural effusions. 2. Pulmonary vascularity appears increased but no overt edema. Viral/atypical respiratory infection felt less likely. Electronically Signed   By: Odessa Fleming M.D.   On: 11/06/2022 10:21  cheduled Meds:  sodium chloride   Intravenous Once   Norethin-Eth Estradiol-Fe  1 tablet Oral Daily   Continuous Infusions:   LOS: 0 days   Marguerita Merles, DO [facility] Available via Epic secure chat 7am-7pm After these hours, please refer  to coverage provider listed on [email or web address] 11/07/2022, 12:24 PM

## 2022-11-07 NOTE — ED Notes (Signed)
ED TO INPATIENT HANDOFF REPORT  ED Nurse Name and Phone #: Morgaine Kimball  S Name/Age/Gender Darlene Hayes 37 y.o. female Room/Bed: WA22/WA22  Code Status   Code Status: Full Code  Home/SNF/Other Home Patient oriented to: self, situation, place, time Is this baseline? Yes   Triage Complete: Triage complete  Chief Complaint Symptomatic anemia [D64.9]  Triage Note Patient arrives ambulatory by POV c/o upper chest pain and soreness in chest since having covid end of August and beginning of September. Patient concerned for pneumonia. States she symptoms worsen when walking. Reports dry cough and feeling like her heart is racing. Patient also adds that her menstrual cycle is now longer and heavier.    Allergies No Known Allergies  Level of Care/Admitting Diagnosis ED Disposition     ED Disposition  Admit   Condition  --   Comment  Hospital Area: [facility] [[id number]]  Level of Care: Telemetry [5]  Admit to tele based on following criteria: Monitor for Ischemic changes  May place patient in observation at [facility] or [facility] if equivalent level of care is available:: No  Covid Evaluation: Asymptomatic - no recent exposure (last 10 days) testing not required  Diagnosis: Symptomatic anemia [4696295]  Admitting Physician: Bobette Mo [[id number]]  Attending Physician: Bobette Mo [[id number]]          B Medical/Surgery History Past Medical History:  Diagnosis Date   Abnormal Pap smear    abn paps since 2000. no additional testing done.    Chlamydia 07/2010   treated. hasn't been with that partner since.    Gonorrhea    treated in July 2012. hasn't been with that partner since.    Vaginal Pap smear, abnormal    Past Surgical History:  Procedure Laterality Date   DILATION AND CURETTAGE OF UTERUS  12/2016   EAB done in [place]     A IV Location/Drains/Wounds Patient Lines/Drains/Airways Status     Active  Line/Drains/Airways     Name Placement date Placement time Site Days   Peripheral IV 11/06/22 20 G Right Antecubital 11/06/22  1057  Antecubital  1            Intake/Output Last 24 hours  Intake/Output Summary (Last 24 hours) at 11/07/2022 1108 Last data filed at 11/06/2022 2254 Gross per 24 hour  Intake 1168 ml  Output --  Net 1168 ml    Labs/Imaging Results for orders placed or performed during the hospital encounter of 11/06/22 (from the past 48 hour(s))  D-dimer, quantitative     Status: None   Collection Time: 11/06/22  8:46 AM  Result Value Ref Range   D-Dimer, Quant  0.00 - 0.50 ug/mL-FEU    QUESTIONABLE RESULTS, RECOMMEND RECOLLECT TO VERIFY    Comment: INFORMED KAREN @0955  ON 10.30.2024 BY [facility] Performed at [facility], [address], [place], [place] [address] CORRECTED ON 10/30 AT 0957: PREVIOUSLY REPORTED AS 0.95   Iron and TIBC     Status: Abnormal   Collection Time: 11/06/22  9:01 AM  Result Value Ref Range   Iron 19 (L) 28 - 170 ug/dL   TIBC 403 (H) 474 - 259 ug/dL   Saturation Ratios 3 (L) 10.4 - 31.8 %   UIBC 554 ug/dL    Comment: Performed at [facility], [address] [place], [place] [address]  Ferritin     Status: Abnormal   Collection Time: 11/06/22  9:01 AM  Result Value Ref  Range   Ferritin 2 (L) 11 - 307 ng/mL    Comment: Performed at [facility], [address], [place], [place] [address]  CBC with Differential/Platelet     Status: Abnormal   Collection Time: 11/06/22 10:02 AM  Result Value Ref Range   WBC 4.1 4.0 - 10.5 K/uL   RBC 1.37 (L) 3.87 - 5.11 MIL/uL   Hemoglobin 2.6 (LL) 12.0 - 15.0 g/dL    Comment: REPEATED TO VERIFY Reticulocyte Hemoglobin testing may be clinically indicated, consider ordering this additional test [id number] THIS CRITICAL RESULT HAS VERIFIED AND BEEN CALLED TO TEETERS,Z. RN BY NICOLE MCCOY ON 10 30 2024 AT 1038, AND HAS BEEN READ BACK.     HCT  9.8 (L) 36.0 - 46.0 %   MCV 71.5 (L) 80.0 - 100.0 fL   MCH 19.0 (L) 26.0 - 34.0 pg   MCHC 26.5 (L) 30.0 - 36.0 g/dL   RDW 25.9 (H) 56.3 - 87.5 %   Platelets 117 (L) 150 - 400 K/uL   nRBC 0.0 0.0 - 0.2 %   Neutrophils Relative % 68 %   Neutro Abs 2.8 1.7 - 7.7 K/uL   Lymphocytes Relative 21 %   Lymphs Abs 0.9 0.7 - 4.0 K/uL   Monocytes Relative 8 %   Monocytes Absolute 0.3 0.1 - 1.0 K/uL   Eosinophils Relative 2 %   Eosinophils Absolute 0.1 0.0 - 0.5 K/uL   Basophils Relative 0 %   Basophils Absolute 0.0 0.0 - 0.1 K/uL   Immature Granulocytes 1 %   Abs Immature Granulocytes 0.02 0.00 - 0.07 K/uL   Tear Drop Cells PRESENT    Polychromasia PRESENT    Target Cells PRESENT     Comment: Performed at [facility], [address] [place], [place] [address]  Comprehensive metabolic panel     Status: Abnormal   Collection Time: 11/06/22 10:02 AM  Result Value Ref Range   Sodium 138 135 - 145 mmol/L   Potassium 3.3 (L) 3.5 - 5.1 mmol/L   Chloride 110 98 - 111 mmol/L   CO2 22 22 - 32 mmol/L   Glucose, Bld 84 70 - 99 mg/dL    Comment: Glucose reference range applies only to samples taken after fasting for at least 8 hours.   BUN <5 (L) 6 - 20 mg/dL   Creatinine, Ser 9.51 (L) 0.44 - 1.00 mg/dL   Calcium 8.2 (L) 8.9 - 10.3 mg/dL   Total Protein 6.7 6.5 - 8.1 g/dL   Albumin 3.4 (L) 3.5 - 5.0 g/dL   AST 27 15 - 41 U/L   ALT 17 0 - 44 U/L   Alkaline Phosphatase 66 38 - 126 U/L   Total Bilirubin 0.7 0.3 - 1.2 mg/dL   GFR, Estimated >88 >41 mL/min    Comment: (NOTE) Calculated using the CKD-EPI Creatinine Equation (2021)    Anion gap 6 5 - 15    Comment: Performed at [facility], [address], [place], [place] [address]  D-dimer, quantitative     Status: Abnormal   Collection Time: 11/06/22 10:02 AM  Result Value Ref Range   D-Dimer, Quant 1.01 (H) 0.00 - 0.50 ug/mL-FEU    Comment: (NOTE) At the manufacturer cut-off value of 0.5 g/mL FEU,  this assay has a negative predictive value of 95-100%.This assay is intended for use in conjunction with a clinical pretest probability (PTP) assessment model to exclude pulmonary embolism (PE) and deep venous thrombosis (DVT)  in outpatients suspected of PE or DVT. Results should be correlated with clinical presentation. Performed at [facility], [address], [place], [place] [address]   Urinalysis, w/ Reflex to Culture (Infection Suspected) -Urine, Clean Catch     Status: Abnormal   Collection Time: 11/06/22 10:27 AM  Result Value Ref Range   Specimen Source URINE, CLEAN CATCH    Color, Urine YELLOW YELLOW   APPearance CLOUDY (A) CLEAR   Specific Gravity, Urine 1.006 1.005 - 1.030   pH 7.0 5.0 - 8.0   Glucose, UA NEGATIVE NEGATIVE mg/dL   Hgb urine dipstick NEGATIVE NEGATIVE   Bilirubin Urine NEGATIVE NEGATIVE   Ketones, ur NEGATIVE NEGATIVE mg/dL   Protein, ur NEGATIVE NEGATIVE mg/dL   Nitrite NEGATIVE NEGATIVE   Leukocytes,Ua MODERATE (A) NEGATIVE   RBC / HPF 0-5 0 - 5 RBC/hpf   WBC, UA 11-20 0 - 5 WBC/hpf    Comment:        Reflex urine culture not performed if WBC <=10, OR if Squamous epithelial cells >5. If Squamous epithelial cells >5 suggest recollection.    Bacteria, UA FEW (A) NONE SEEN   Squamous Epithelial / HPF 21-50 0 - 5 /HPF    Comment: Performed at [facility], [address], [place], [place] [address]  Pregnancy, urine     Status: None   Collection Time: 11/06/22 10:27 AM  Result Value Ref Range   Preg Test, Ur NEGATIVE NEGATIVE    Comment:        THE SENSITIVITY OF THIS METHODOLOGY IS >25 mIU/mL. Performed at [facility], [address], [place], [place] [address]   Type and screen [facility]     Status: None (Preliminary result)   Collection Time: 11/06/22 11:14 AM  Result Value Ref Range   ABO/RH(D) A POS    Antibody Screen NEG    Sample Expiration 11/09/2022,2359     Unit Number [id number]    Blood Component Type RED CELLS,LR    Unit division 00    Status of Unit ISSUED,FINAL    Transfusion Status OK TO TRANSFUSE    Crossmatch Result Compatible    Unit Number [id number]    Blood Component Type RED CELLS,LR    Unit division 00    Status of Unit ISSUED,FINAL    Transfusion Status OK TO TRANSFUSE    Crossmatch Result Compatible    Unit Number [id number]    Blood Component Type RED CELLS,LR    Unit division 00    Status of Unit ISSUED,FINAL    Transfusion Status OK TO TRANSFUSE    Crossmatch Result      Compatible Performed at [facility], [address], [place], [place] [address]    Unit Number [id number]    Blood Component Type RED CELLS,LR    Unit division 00    Status of Unit ALLOCATED    Transfusion Status OK TO TRANSFUSE    Crossmatch Result Compatible    Unit Number [id number]    Blood Component Type RED CELLS,LR    Unit division 00    Status of Unit ALLOCATED    Transfusion Status OK TO TRANSFUSE    Crossmatch Result Compatible    Unit Number [id number]    Blood Component Type RED CELLS,LR    Unit division 00    Status of Unit ISSUED    Transfusion Status OK TO TRANSFUSE    Crossmatch Result Compatible   Prepare  RBC (crossmatch)     Status: None   Collection Time: 11/06/22 11:14 AM  Result Value Ref Range   Order Confirmation      ORDER PROCESSED BY BLOOD BANK Performed at [facility], [address] [place], [place] [address]   Prepare RBC (crossmatch)     Status: None   Collection Time: 11/06/22  7:32 PM  Result Value Ref Range   Order Confirmation      ORDER PROCESSED BY BLOOD BANK Performed at [facility], [address] [place], [place] [address]   Hemoglobin and hematocrit, blood     Status: Abnormal   Collection Time: 11/07/22  3:13 AM  Result Value Ref Range   Hemoglobin 5.7 (LL) 12.0 - 15.0 g/dL    Comment: CRITICAL VALUE  NOTED.  VALUE IS CONSISTENT WITH PREVIOUSLY REPORTED AND CALLED VALUE. REPEATED TO VERIFY POST TRANSFUSION SPECIMEN    HCT 18.5 (L) 36.0 - 46.0 %    Comment: Performed at [facility], [address] [place], [place] [address]  Prepare RBC (crossmatch)     Status: None   Collection Time: 11/07/22  8:18 AM  Result Value Ref Range   Order Confirmation      ORDER PROCESSED BY BLOOD BANK Performed at [facility], [address] [place], [place] [address]    US PELVIC COMPLETE WITH TRANSVAGINAL  Result Date: 11/06/2022 CLINICAL DATA:  37 year old female with cough, chest pain, palpitations. Dysfunctional uterine bleeding. LMP 10/19/2022. EXAM: TRANSABDOMINAL AND TRANSVAGINAL ULTRASOUND OF PELVIS TECHNIQUE: Both transabdominal and transvaginal ultrasound examinations of the pelvis were performed. Transabdominal technique was performed for global imaging of the pelvis including uterus, ovaries, adnexal regions, and pelvic cul-de-sac. It was necessary to proceed with endovaginal exam following the transabdominal exam to visualize the ovaries. COMPARISON:  Ob ultrasound 10/24/2016. FINDINGS: Uterus Measurements: 12.9 x 7.4 x 9.4 cm = volume: 468 mL. Multiple uterine fibroids with hypervascularity (image 97) ranging from 3.0 to 7.3 cm diameter (images 64 and 65. These generally appear to be intramural, and there is distortion of the endometrial contour (image 22) suggesting submucosal location of some lesions. Endometrium Thickness: 5 mm (image 122). Partially obscured by fibroid disease. No discrete endometrial lesion. Right ovary Only identified transabdominally. Measurements: 2.8 x 1.9 x 2.9 cm = volume: 8 mL. Normal appearance/no adnexal mass. Left ovary Only identified transabdominally. Measurements: 3.1 x 2.5 x 2.5 = volume: 10 mL. Normal appearance. Normal follicle (image 53 no follow-up imaging recommended), no adnexal mass. Other findings No free fluid.  IMPRESSION: 1. Enlarged fibroid uterus. 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 2. Endometrium mildly distorted by fibroids, otherwise negative. 3. Normal ovaries. Electronically Signed   By: Odessa Fleming M.D.   On: 11/06/2022 16:39   DG Chest 2 View  Result Date: 11/06/2022 CLINICAL DATA:  37 year old female with cough, chest pain status post COVID-19. In August. Palpitations. EXAM: CHEST - 2 VIEW COMPARISON:  None Available. FINDINGS: PA and lateral views 1013 hours. Smaller trace layering pleural effusions in the posterior costophrenic sulci. Mild cardiomegaly. Other mediastinal contours are within normal limits. Visualized tracheal air column is within normal limits. No consolidation. No pneumothorax. Mild increased pulmonary interstitium but no overt edema. No confluent opacity. No acute osseous abnormality identified. Negative visible bowel gas. IMPRESSION: 1. Borderline to mild cardiomegaly with trace pleural effusions. 2. Pulmonary vascularity appears increased but no overt edema. Viral/atypical respiratory infection felt less  likely. Electronically Signed   By: Odessa Fleming M.D.   On: 11/06/2022 10:21    Pending Labs Unresulted Labs (From admission, onward)     Start     Ordered   11/07/22 0929  Comprehensive metabolic panel  Once,   R        11/07/22 0928   11/07/22 0929  Magnesium  Once,   R        11/07/22 0928   11/07/22 0929  Phosphorus  Once,   R        11/07/22 0928   11/07/22 0928  CBC with Differential/Platelet  Once,   R        11/07/22 0928   11/07/22 0800  CBC  Once,   STAT        11/07/22 0800   11/07/22 0645  CBC  Once,   R        11/07/22 0645   11/07/22 0645  Comprehensive metabolic panel  Once,   R        11/07/22 0645   11/07/22 0500  HIV Antibody (routine testing w rflx)  (HIV Antibody (Routine testing w reflex) panel)  Tomorrow morning,   R        11/06/22 1154   11/06/22 0846  CBC with Differential  Once,   STAT         11/06/22 0845            Vitals/Pain Today's Vitals   11/07/22 0554 11/07/22 0734 11/07/22 0858 11/07/22 0915  BP: (!) 170/100  (!) 140/65 119/73  Pulse: 73  63 83  Resp: 16  16 16   Temp: 98 F (36.7 C)  98.3 F (36.8 C) 97.9 F (36.6 C)  TempSrc: Oral  Oral Oral  SpO2: 100%  100% 100%  Weight:      Height:      PainSc: 6  6       Isolation Precautions No active isolations  Medications Medications  acetaminophen (TYLENOL) tablet 650 mg (has no administration in time range)    Or  acetaminophen (TYLENOL) suppository 650 mg (has no administration in time range)  ondansetron (ZOFRAN) tablet 4 mg (has no administration in time range)    Or  ondansetron (ZOFRAN) injection 4 mg (has no administration in time range)  Norethin-Eth Estradiol-Fe [facility] FE) tablet 1 tablet (1 tablet Oral Not Given 11/06/22 2254)  nicotine (NICODERM CQ - dosed in mg/24 hours) patch 14 mg (has no administration in time range)  0.9 %  sodium chloride infusion (Manually program via Guardrails IV Fluids) (has no administration in time range)  0.9 %  sodium chloride infusion (Manually program via Guardrails IV Fluids) (0 mLs Intravenous Stopped 11/06/22 2254)  potassium chloride SA (KLOR-CON M) CR tablet 40 mEq (40 mEq Oral Given 11/06/22 1301)  0.9 %  sodium chloride infusion (Manually program via Guardrails IV Fluids) (0 mLs Intravenous Stopped 11/06/22 2254)    Mobility walks     Focused Assessments    R Recommendations: See Admitting Provider Note  Report given to:   Additional Notes:

## 2022-11-07 NOTE — Hospital Course (Addendum)
The patient is a 37 year old obese African-American female with a past medical history significant for but not limited to abdominal Pap smear which history of chlamydia and gonorrhea and other comorbidities who has worsening menorrhagia that last about 2 weeks a month required a large amount of pads who presented with chest discomfort and dyspnea.  The patient states that she gets some abdominal pain due to dysmenorrhea but states the pain has not been out of ordinary hour for the last few days is progressively had worsening dyspnea associated with chest tightness when she exerts herself.  She denied any fevers or chills and no chest pain, diaphoresis or palpitation.  States that she has very heavy bleeding normally and sometimes even wears a diaper due to the large amount of blood.  Given her dyspnea associated chest tightness on exertion she presented to the ED for further workup and chest x-ray was done which showed borderline mild cardiomegaly with trace pleural effusions and pulmonary vascularity appear to be increased but no overt edema noted.  She underwent pelvic ultrasound as well and as below.  Given her symptoms she is found to have significant symptomatic anemia and the case was discussed with the OB/GYN who recommended starting her on OCP therapy and she was typed and screened and transfused 3 units of blood yesterday and will be getting 2 units of blood today.  Patient is to also get an echocardiogram done given her cardiomegaly.  She improved after the blood transfusion echocardiogram done and showed "Left ventricular ejection fraction, by estimation, is 50 to 55%. The  left ventricle has low normal function. The left ventricle has no regional wall motion abnormalities. Left ventricular diastolic parameters are  indeterminate. Right ventricular systolic function is normal. The right ventricular size is normal. There is mildly elevated pulmonary artery systolic pressure. The estimated right  ventricular systolic pressure is 36.9 mmHg. Left atrial size was moderate to severe.  Moderate to severe mitral valve regurgitation. The aortic valve is tricuspid. Aortic valve regurgitation is not visualized.  The inferior vena cava is dilated in size with <50% respiratory variability, suggesting right atrial pressure of 15 mmHg. "  Given her moderate to severe mitral valve regurgitation the case was discussed with cardiology who recommends outpatient follow-up.  She is improved and did not desaturate on home O2 screen and will need to follow-up with PCP, cardiology and OB/GYN within 1 to 2 weeks.  Assessment and Plan:  Symptomatic Anemia in the setting of longstanding Menorrhagia  -Hgb/Hct Trend: Recent Labs  Lab 11/06/22 1002 11/07/22 0313 11/07/22 1935 11/08/22 0352  HGB 2.6* 5.7* 8.4* 8.2*  HCT 9.8* 18.5* 26.0* 26.2*  MCV 71.5*  --  81.3 83.7  -Anemia Panel was checked and showed an iron level of 19, UIBC 554, TIBC 573, saturation ratios of 3%, ferritin level 2 -Continue to Monitor For S/Sx of Bleeding; No overt bleeding noted -Continue PRBC transfusion; s/p 3 units on 11/06/22 and will be getting an additional 2 units today. 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  The endometrium was also mildly distorted by fibroids and otherwise negative with normal ovaries. -Continue to Monitor H&H and Transfuse further as needed. -Case discussed with GYN on-call (Dr. Vergie Living) and He recommended and already ordered OCP therapy with Norethin-Eth Estrodiol-Fe tablet 1 tablet Daily -He is making arrangements to follow her in the next week  or 2. -Repeat CBC within 1 week  Hypokalemia -Patient's K+ Level Trend: Recent Labs  Lab 11/06/22 1002 11/07/22 1935 11/08/22 0352  K 3.3* 3.7 3.9  -Continue to Monitor and Replete as Necessary -Repeat CMP in the AM    Thrombocytopenia -Platelet Count Trend: Recent Labs  Lab 11/06/22 1002 11/07/22 1935 11/08/22 0352  PLT 117* 123* 119*  -Continue to Monitor for S/Sx of Bleeding; No overt bleeding noted -Repeat CBC within 1 week   Cardiomegaly with abnormal EKG Moderate to severe mitral valve regurgitation -Secondary to profound anemia. -Echocardiogram was done and showed ""Left ventricular ejection fraction, by estimation, is 50 to 55%. The left ventricle has low normal function. The left ventricle has no regional wall motion abnormalities. Left ventricular diastolic parameters are  indeterminate. Right ventricular systolic function is normal. The right ventricular size is normal. There is mildly elevated pulmonary artery systolic pressure. The estimated right ventricular systolic pressure is 36.9 mmHg. Left atrial size was moderate to severe.  Moderate to severe mitral valve regurgitation. The aortic valve is tricuspid. Aortic valve regurgitation is not visualized.  The inferior vena cava is dilated in size with <50% respiratory variability, suggesting right atrial pressure of 15 mmHg. " -Case was discussed with Dr. Cristal Deer of cardiology who recommends outpatient follow-up  Hyperlipidemia -Currently not on medical therapy. -Needs to establish with a primary care provider and follow-up within 1 to 2 weeks   Tobacco Use -Tobacco cessation advised. -Nicotine replacement therapy ordered with Nicotine Patch 14 mg TD   Hypoalbuminemia -Patient's Albumin Trend: Recent Labs  Lab 11/06/22 1002 11/07/22 1935 11/08/22 0352  ALBUMIN 3.4* 3.5 3.2*  -Continue to Monitor and Trend and repeat CMP in the AM  Obesity -Complicates overall prognosis and care -Estimated body mass index is 34.61 kg/m as calculated from the following:   Height as of this encounter: 5\' 5"  (1.651 m).   Weight as of this encounter: 94.3 kg.  -Weight Loss and Dietary Counseling given and will need further lifestyle  modifications and Follow-up with closely PCP and/or bariatric clinic.

## 2022-11-07 NOTE — Progress Notes (Signed)
Echocardiogram 2D Echocardiogram has been performed.  Darlene Hayes 11/07/2022, 12:48 PM

## 2022-11-08 DIAGNOSIS — E785 Hyperlipidemia, unspecified: Secondary | ICD-10-CM | POA: Diagnosis not present

## 2022-11-08 DIAGNOSIS — D649 Anemia, unspecified: Secondary | ICD-10-CM | POA: Diagnosis not present

## 2022-11-08 DIAGNOSIS — I517 Cardiomegaly: Secondary | ICD-10-CM | POA: Diagnosis not present

## 2022-11-08 DIAGNOSIS — R9431 Abnormal electrocardiogram [ECG] [EKG]: Secondary | ICD-10-CM | POA: Diagnosis not present

## 2022-11-08 LAB — COMPREHENSIVE METABOLIC PANEL
ALT: 31 U/L (ref 0–44)
AST: 72 U/L — ABNORMAL HIGH (ref 15–41)
Albumin: 3.2 g/dL — ABNORMAL LOW (ref 3.5–5.0)
Alkaline Phosphatase: 82 U/L (ref 38–126)
Anion gap: 9 (ref 5–15)
BUN: 10 mg/dL (ref 6–20)
CO2: 19 mmol/L — ABNORMAL LOW (ref 22–32)
Calcium: 8.5 mg/dL — ABNORMAL LOW (ref 8.9–10.3)
Chloride: 110 mmol/L (ref 98–111)
Creatinine, Ser: 0.44 mg/dL (ref 0.44–1.00)
GFR, Estimated: >60 mL/min (ref >60)
Glucose, Bld: 94 mg/dL (ref 70–99)
Potassium: 3.9 mmol/L (ref 3.5–5.1)
Sodium: 138 mmol/L (ref 135–145)
Total Bilirubin: 0.9 mg/dL (ref 0.3–1.2)
Total Protein: 6.5 g/dL (ref 6.5–8.1)

## 2022-11-08 LAB — CBC WITH DIFFERENTIAL/PLATELET
Abs Immature Granulocytes: 0.03 10*3/uL (ref 0.00–0.07)
Basophils Absolute: 0 10*3/uL (ref 0.0–0.1)
Basophils Relative: 1 %
Eosinophils Absolute: 0.2 10*3/uL (ref 0.0–0.5)
Eosinophils Relative: 3 %
HCT: 26.2 % — ABNORMAL LOW (ref 36.0–46.0)
Hemoglobin: 8.2 g/dL — ABNORMAL LOW (ref 12.0–15.0)
Immature Granulocytes: 0 %
Lymphocytes Relative: 18 %
Lymphs Abs: 1.2 10*3/uL (ref 0.7–4.0)
MCH: 26.2 pg (ref 26.0–34.0)
MCHC: 31.3 g/dL (ref 30.0–36.0)
MCV: 83.7 fL (ref 80.0–100.0)
Monocytes Absolute: 0.5 10*3/uL (ref 0.1–1.0)
Monocytes Relative: 7 %
Neutro Abs: 4.8 10*3/uL (ref 1.7–7.7)
Neutrophils Relative %: 71 %
Platelets: 119 10*3/uL — ABNORMAL LOW (ref 150–400)
RBC: 3.13 MIL/uL — ABNORMAL LOW (ref 3.87–5.11)
RDW: 20.2 % — ABNORMAL HIGH (ref 11.5–15.5)
WBC: 6.7 10*3/uL (ref 4.0–10.5)
nRBC: 0 % (ref 0.0–0.2)

## 2022-11-08 LAB — PHOSPHORUS: Phosphorus: 3.6 mg/dL (ref 2.5–4.6)

## 2022-11-08 LAB — MAGNESIUM: Magnesium: 2.2 mg/dL (ref 1.7–2.4)

## 2022-11-08 MED ORDER — NORETHIN-ETH ESTRADIOL-FE 0.4-35 MG-MCG PO CHEW: 1.0000 | CHEWABLE_TABLET | Freq: Every day | ORAL | 11 refills | Status: DC

## 2022-11-08 MED ORDER — SODIUM BICARBONATE 650 MG PO TABS
650.0000 mg | ORAL_TABLET | Freq: Two times a day (BID) | ORAL | Status: DC
  Administered 2022-11-08: 650 mg via ORAL
  Filled 2022-11-08: qty 1

## 2022-11-08 MED ORDER — ACETAMINOPHEN 325 MG PO TABS: 650.0000 mg | ORAL_TABLET | Freq: Four times a day (QID) | ORAL | 0 refills | Status: DC | PRN

## 2022-11-08 MED ORDER — ONDANSETRON HCL 4 MG PO TABS: 4.0000 mg | ORAL_TABLET | Freq: Four times a day (QID) | ORAL | 0 refills | Status: DC | PRN

## 2022-11-08 MED ORDER — NICOTINE 14 MG/24HR TD PT24: 14.0000 mg | MEDICATED_PATCH | Freq: Every day | TRANSDERMAL | 0 refills | Status: DC | PRN

## 2022-11-08 NOTE — Discharge Instructions (Addendum)
Patient given list of [facility] Primary Care Providers, and [facility] Patient given [facility] [employer] [facility] in [place], and [employer] in [place]

## 2022-11-08 NOTE — Plan of Care (Signed)
  Problem: Clinical Measurements: Goal: Ability to maintain clinical measurements within normal limits will improve Outcome: Progressing Goal: Diagnostic test results will improve Outcome: Progressing Goal: Cardiovascular complication will be avoided Outcome: Progressing   Problem: Education: Goal: Knowledge of General Education information will improve Description: Including pain rating scale, medication(s)/side effects and non-pharmacologic comfort measures Outcome: Adequate for Discharge   Problem: Health Behavior/Discharge Planning: Goal: Ability to manage health-related needs will improve Outcome: Adequate for Discharge   Problem: Clinical Measurements: Goal: Will remain free from infection Outcome: Adequate for Discharge Goal: Respiratory complications will improve Outcome: Adequate for Discharge   Problem: Activity: Goal: Risk for activity intolerance will decrease Outcome: Adequate for Discharge   Problem: Nutrition: Goal: Adequate nutrition will be maintained Outcome: Adequate for Discharge   Problem: Coping: Goal: Level of anxiety will decrease Outcome: Adequate for Discharge   Problem: Elimination: Goal: Will not experience complications related to bowel motility Outcome: Adequate for Discharge Goal: Will not experience complications related to urinary retention Outcome: Adequate for Discharge   Problem: Pain Management: Goal: General experience of comfort will improve Outcome: Adequate for Discharge   Problem: Safety: Goal: Ability to remain free from injury will improve Outcome: Adequate for Discharge   Problem: Skin Integrity: Goal: Risk for impaired skin integrity will decrease Outcome: Adequate for Discharge

## 2022-11-08 NOTE — Discharge Summary (Signed)
Physician Discharge Summary   Patient: Darlene Hayes MRN: [id number] DOB: [birth year]  Admit date:     11/06/2022  Discharge date: 11/08/22  Discharge Physician: Marguerita Merles, DO   PCP: Patient, No Pcp Per   Recommendations at discharge:  {Tip this will not be part of the note when signed- Example include specific recommendations for outpatient follow-up, pending tests to follow-up on. (Optional):26781}  ***  Discharge Diagnoses: Principal Problem:   Symptomatic anemia Active Problems:   Obesity, unspecified   Hyperlipidemia   Hypokalemia   Tobacco use   Menorrhagia   Cardiomegaly   Abnormal EKG  Resolved Problems:   * No resolved hospital problems. [facility] Course: The patient is a 37 year old obese African-American female with a past medical history significant for but not limited to abdominal Pap smear which history of chlamydia and gonorrhea and other comorbidities who has worsening menorrhagia that last about 2 weeks a month required a large amount of pads who presented with chest discomfort and dyspnea.  The patient states that she gets some abdominal pain due to dysmenorrhea but states the pain has not been out of ordinary hour for the last few days is progressively had worsening dyspnea associated with chest tightness when she exerts herself.  She denied any fevers or chills and no chest pain, diaphoresis or palpitation.  States that she has very heavy bleeding normally and sometimes even wears a diaper due to the large amount of blood.  Given her dyspnea associated chest tightness on exertion she presented to the ED for further workup and chest x-ray was done which showed borderline mild cardiomegaly with trace pleural effusions and pulmonary vascularity appear to be increased but no overt edema noted.  She underwent pelvic ultrasound as well and as below.  Given her symptoms she is found to have significant symptomatic anemia and the case was discussed with the OB/GYN  who recommended starting her on OCP therapy and she was typed and screened and transfused 3 units of blood yesterday and will be getting 2 units of blood today.  Patient is to also get an echocardiogram done given her cardiomegaly.  Assessment and Plan:  Symptomatic Anemia in the setting of longstanding Menorrhagia  -Hgb/Hct Trend: Recent Labs  Lab 11/06/22 1002 11/07/22 0313  HGB 2.6* 5.7*  HCT 9.8* 18.5*  MCV 71.5*  --   -Anemia Panel was checked and showed an iron level of 19, UIBC 554, TIBC 573, saturation ratios of 3%, ferritin level 2 -Continue to Monitor For S/Sx of Bleeding; No overt bleeding noted -Continue PRBC transfusion; s/p 3 units on 11/06/22 and will be getting an additional 2 units today. 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  The endometrium was also mildly distorted by fibroids and otherwise negative with normal ovaries. -Continue to Monitor H&H and Transfuse further as needed. -Case discussed with GYN on-call (Dr. Vergie Living) and He recommended and already ordered OCP therapy with Norethin-Eth Estrodiol-Fe tablet 1 tablet Daily -He is making arrangements to follow her in the next week or 2. -Repeat CBC in the AM  Hypokalemia -Patient's K+ Level Trend: Recent Labs  Lab 11/06/22 1002  K 3.3*  -Replete with with po KCL 40 mEQ x1 yesterday -Continue to Monitor and Replete as Necessary -Repeat CMP in the AM   Thrombocytopenia -Platelet Count Trend: Recent Labs  Lab 11/06/22 1002  PLT 117*  -Continue to Monitor for S/Sx of Bleeding; No overt bleeding noted -Repeat CBC in the AM   Cardiomegaly with abnormal EKG -Secondary to profound anemia. -Checking Echocardiogram.  Hyperlipidemia -Currently not on medical therapy. -Needs to establish with a primary care provider.   Tobacco Use -Tobacco cessation  advised. -Nicotine replacement therapy ordered with Nicotine Patch 14 mg TD   Hypoalbuminemia -Patient's Albumin Trend: Recent Labs  Lab 11/06/22 1002  ALBUMIN 3.4*  -Continue to Monitor and Trend and repeat CMP in the AM  Obesity -Complicates overall prognosis and care -Estimated body mass index is 34.61 kg/m as calculated from the following:   Height as of this encounter: 5\' 5"  (1.651 m).   Weight as of this encounter: 94.3 kg.  -Weight Loss and Dietary Counseling given and will need further lifestyle modifications and Follow-up with closely PCP and/or bariatric clinic.     Assessment and Plan: No notes have been filed under this hospital service. Service: Hospitalist     {Tip this will not be part of the note when signed Body mass index is 34.61 kg/m. , ,  (Optional):26781}  {(NOTE) Pain control PDMP Statment (Optional):26782} Consultants: *** Procedures performed: ***  Disposition: {Plan; Disposition:26390} Diet recommendation:  Discharge Diet Orders (From admission, onward)     Start     Ordered   11/08/22 0000  Diet - low sodium heart healthy        11/08/22 1257           {Diet_Plan:26776} DISCHARGE MEDICATION: Allergies as of 11/08/2022   No Known Allergies      Medication List     TAKE these medications    acetaminophen 325 MG tablet Commonly known as: TYLENOL Take 2 tablets (650 mg total) by mouth every 6 (six) hours as needed for mild pain (pain score 1-3) (or Fever >/= 101).   nicotine 14 mg/24hr patch Commonly known as: NICODERM CQ - dosed in mg/24 hours Place 1 patch (14 mg total) onto the skin daily as needed (Nicotine withdrawal).   Norethin-Eth Estradiol-Fe 0.4-35 MG-MCG tablet Commonly known as: FEMCON FE Chew 1 tablet by mouth daily. Start taking on: November 09, 2022   ondansetron 4 MG tablet Commonly known as: ZOFRAN Take 1 tablet (4 mg total) by mouth every 6 (six) hours as needed for nausea.        Follow-up  Information     [facility]. Go on 11/13/2022.   Specialty: Obstetrics and Gynecology Why: at 1:55 Contact information: [address] [place] [address] [phone]        Jodelle Red, MD Follow up.   Specialty: Cardiology Why: cardiology scheduler will contact you to arrange a new patient visit to discuss work up for the leakage valve seen on echocardiogram and outpatient workup. If you ddo not hear from our scheduler in 3 bussiness days, please give [place] a call. Contact information: [address] [place] [address] [phone]                Discharge Exam: Ceasar Mons Weights   11/06/22 0756  Weight: 94.3 kg   ***  Condition at discharge: {DC Condition:26389}  The results of significant diagnostics from this hospitalization (including imaging, microbiology, ancillary and laboratory) are listed below for reference.   Imaging Studies: ECHOCARDIOGRAM COMPLETE  Result Date: 11/07/2022    ECHOCARDIOGRAM REPORT   Patient Name:   Darlene Hayes Date of Exam: 11/07/2022 Medical Rec #:  [id number]          Height:       65.0 in Accession #:    [id number]         Weight:       208.0 lb Date of Birth:  December 16, 1985          BSA:          2.011 m Patient Age:    37 years           BP:           170/100 mmHg Patient Gender: F                  HR:           53 bpm. Exam Location:  Inpatient Procedure: 2D Echo, Cardiac Doppler and Color Doppler Indications:    Cardiomegaly I51.7  History:        Patient has no prior history of Echocardiogram examinations.                 Cardiomegaly; Risk Factors:Dyslipidemia and Current Smoker.  Sonographer:    Lucendia Herrlich RCS Referring Phys: [phone] DAVID MANUEL ORTIZ IMPRESSIONS  1. Left ventricular ejection fraction, by estimation, is 50 to 55%. The left ventricle has low normal function. The left ventricle has no regional wall motion abnormalities. Left  ventricular diastolic parameters are indeterminate.  2. Right ventricular systolic function is normal. The right ventricular size is normal. There is mildly elevated pulmonary artery systolic pressure. The estimated right ventricular systolic pressure is 36.9 mmHg.  3. Left atrial size was moderate to severe.  4. Moderate to severe mitral valve regurgitation.  5. The aortic valve is tricuspid. Aortic valve regurgitation is not visualized.  6. The inferior vena cava is dilated in size with <50% respiratory variability, suggesting right atrial pressure of 15 mmHg. Conclusion(s)/Recommendation(s): MR is at least moderate. 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 recommend a TEE. FINDINGS  Left Ventricle: Left ventricular ejection fraction, by estimation, is 50 to 55%. The left ventricle has low normal function. The left ventricle has no regional wall motion abnormalities. The left ventricular internal cavity size was normal in size. There is no left ventricular hypertrophy. Left ventricular diastolic parameters are indeterminate. Right Ventricle: The right ventricular size is normal. Right ventricular systolic function is normal. There is mildly elevated pulmonary artery systolic pressure. The tricuspid regurgitant velocity is 2.34 m/s, and with an assumed right atrial pressure of 15 mmHg, the estimated right ventricular systolic pressure is 36.9 mmHg. Left Atrium: Left atrial size was moderate to severe. Right Atrium: Right atrial size was normal in size. Pericardium: There is no evidence of pericardial effusion. Mitral Valve: Moderate to severe mitral valve regurgitation. Tricuspid Valve: Tricuspid valve regurgitation is mild. Aortic Valve: The aortic valve is tricuspid. Aortic valve regurgitation is not visualized. Aortic valve peak gradient measures 16.3 mmHg. Aorta: The aortic root and ascending aorta are structurally normal, with no evidence of dilitation. Venous: The inferior vena cava is dilated in size with less  than 50% respiratory variability, suggesting right atrial pressure of 15 mmHg. IAS/Shunts: The interatrial septum was not well visualized.  LEFT VENTRICLE PLAX 2D LVIDd:         5.70 cm   Diastology LVIDs:         4.20 cm   LV e' medial:    9.46 cm/s LV PW:         1.10 cm   LV E/e' medial:  15.1  LV IVS:        0.90 cm   LV e' lateral:   9.25 cm/s LVOT diam:     2.00 cm   LV E/e' lateral: 15.5 LV SV:         77 LV SV Index:   38 LVOT Area:     3.14 cm  RIGHT VENTRICLE             IVC RV S prime:     10.70 cm/s  IVC diam: 2.80 cm TAPSE (M-mode): 2.8 cm LEFT ATRIUM            Index        RIGHT ATRIUM           Index LA diam:      5.20 cm  2.59 cm/m   RA Area:     12.30 cm LA Vol (A2C): 72.0 ml  35.80 ml/m  RA Volume:   25.10 ml  12.48 ml/m LA Vol (A4C): 117.0 ml 58.17 ml/m  AORTIC VALVE AV Area (Vmax): 1.76 cm AV Vmax:        202.00 cm/s AV Peak Grad:   16.3 mmHg LVOT Vmax:      113.00 cm/s LVOT Vmean:     73.700 cm/s LVOT VTI:       0.246 m  AORTA Ao Root diam: 2.40 cm Ao Asc diam:  3.10 cm MITRAL VALVE                TRICUSPID VALVE MV Area (PHT): 3.72 cm     TR Peak grad:   21.9 mmHg MV Decel Time: 204 msec     TR Vmax:        234.00 cm/s MR Peak grad: 117.7 mmHg MR Vmax:      542.50 cm/s   SHUNTS MV E velocity: 143.00 cm/s  Systemic VTI:  0.25 m MV A velocity: 46.70 cm/s   Systemic Diam: 2.00 cm MV E/A ratio:  3.06 [place] signed by Carolan Clines Signature Date/Time: 11/07/2022/2:17:32 PM    Final    US PELVIC COMPLETE WITH TRANSVAGINAL  Result Date: 11/06/2022 CLINICAL DATA:  37 year old female with cough, chest pain, palpitations. Dysfunctional uterine bleeding. LMP 10/19/2022. EXAM: TRANSABDOMINAL AND TRANSVAGINAL ULTRASOUND OF PELVIS TECHNIQUE: Both transabdominal and transvaginal ultrasound examinations of the pelvis were performed. Transabdominal technique was performed for global imaging of the pelvis including uterus, ovaries, adnexal regions, and pelvic cul-de-sac. It was  necessary to proceed with endovaginal exam following the transabdominal exam to visualize the ovaries. COMPARISON:  Ob ultrasound 10/24/2016. FINDINGS: Uterus Measurements: 12.9 x 7.4 x 9.4 cm = volume: 468 mL. Multiple uterine fibroids with hypervascularity (image 97) ranging from 3.0 to 7.3 cm diameter (images 64 and 65. These generally appear to be intramural, and there is distortion of the endometrial contour (image 22) suggesting submucosal location of some lesions. Endometrium Thickness: 5 mm (image 122). Partially obscured by fibroid disease. No discrete endometrial lesion. Right ovary Only identified transabdominally. Measurements: 2.8 x 1.9 x 2.9 cm = volume: 8 mL. Normal appearance/no adnexal mass. Left ovary Only identified transabdominally. Measurements: 3.1 x 2.5 x 2.5 = volume: 10 mL. Normal appearance. Normal follicle (image 53 no follow-up imaging recommended), no adnexal mass. Other findings No free fluid. IMPRESSION: 1. Enlarged fibroid uterus. 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 2. Endometrium mildly distorted by fibroids, otherwise negative. 3. Normal ovaries. Electronically Signed   By: Althea Grimmer.D.  On: 11/06/2022 16:39   DG Chest 2 View  Result Date: 11/06/2022 CLINICAL DATA:  37 year old female with cough, chest pain status post COVID-19. In August. Palpitations. EXAM: CHEST - 2 VIEW COMPARISON:  None Available. FINDINGS: PA and lateral views 1013 hours. Smaller trace layering pleural effusions in the posterior costophrenic sulci. Mild cardiomegaly. Other mediastinal contours are within normal limits. Visualized tracheal air column is within normal limits. No consolidation. No pneumothorax. Mild increased pulmonary interstitium but no overt edema. No confluent opacity. No acute osseous abnormality identified. Negative visible bowel gas. IMPRESSION: 1. Borderline to mild cardiomegaly with trace pleural effusions. 2. Pulmonary  vascularity appears increased but no overt edema. Viral/atypical respiratory infection felt less likely. Electronically Signed   By: Odessa Fleming M.D.   On: 11/06/2022 10:21    Microbiology: Results for orders placed or performed during the hospital encounter of 09/21/20  Novel Coronavirus, NAA ([facility])     Status: Abnormal   Collection Time: 09/21/20  3:45 PM   Specimen: Nasopharyngeal Swab; Nasopharyngeal(NP) swabs in vial transport medium   Nasopharynge  Result Value Ref Range Status   SARS-CoV-2, NAA Detected (A) Not Detected Final    Comment: Patients who have a positive COVID-19 test result may now have treatment options. Treatment options are available for patients with mild to moderate symptoms and for hospitalized patients. Visit our website at [email or web address] for resources and information. This nucleic acid amplification test was developed and its performance characteristics determined by [employer]. Nucleic acid amplification tests include RT-PCR and TMA. This test has not been FDA cleared or approved. This test has been authorized by FDA under an Emergency Use Authorization (EUA). This test is only authorized for the duration of time the declaration that circumstances exist justifying the authorization of the emergency use of in vitro diagnostic tests for detection of SARS-CoV-2 virus and/or diagnosis of COVID-19 infection under section 564(b)(1) of the Act, 21 U.S.C. 409WJX-9(J) (1), unless the authorization is terminated or revoked sooner. When diagnostic testing is negativ e, the possibility of a false negative result should be considered in the context of a patient's recent exposures and the presence of clinical signs and symptoms consistent with COVID-19. An individual without symptoms of COVID-19 and who is not shedding SARS-CoV-2 virus would expect to have a negative (not detected) result in this assay.   SARS-COV-2, NAA 2 DAY TAT     Status:  None   Collection Time: 09/21/20  3:45 PM   Nasopharynge  Result Value Ref Range Status   SARS-CoV-2, NAA 2 DAY TAT Performed  Final    Labs: CBC: Recent Labs  Lab 11/06/22 1002 11/07/22 0313 11/07/22 1935 11/08/22 0352  WBC 4.1  --  5.4 6.7  NEUTROABS 2.8  --  3.3 4.8  HGB 2.6* 5.7* 8.4* 8.2*  HCT 9.8* 18.5* 26.0* 26.2*  MCV 71.5*  --  81.3 83.7  PLT 117*  --  123* 119*   Basic Metabolic Panel: Recent Labs  Lab 11/06/22 1002 11/07/22 1935 11/08/22 0352  NA 138 137 138  K 3.3* 3.7 3.9  CL 110 109 110  CO2 22 21* 19*  GLUCOSE 84 91 94  BUN <5* 8 10  CREATININE 0.38* 0.68 0.44  CALCIUM 8.2* 8.5* 8.5*  MG  --  2.4 2.2  PHOS  --  3.8 3.6   Liver Function Tests: Recent Labs  Lab 11/06/22 1002 11/07/22 1935 11/08/22 0352  AST 27 24 72*  ALT 17 15 31   ALKPHOS 66 71  82  BILITOT 0.7 0.9 0.9  PROT 6.7 7.1 6.5  ALBUMIN 3.4* 3.5 3.2*   CBG: No results for input(s): "GLUCAP" in the last 168 hours.  Discharge time spent: {LESS THAN/GREATER BJYN:82956} 30 minutes.  Signed: Merlene Laughter, DO [facility] 11/08/2022

## 2022-11-08 NOTE — TOC Initial Note (Signed)
Transition of Care [facility]) - Initial/Assessment Note    Patient Details  Name: Darlene Hayes MRN: [id number] Date of Birth: March 04, 1985  Transition [facility]) CM/SW Contact:    Adrian Prows, RN Phone Number: 11/08/2022, 10:10 AM  Clinical Narrative:                 TOC for SDOH risks; spoke w/ pt and her mother in room; her mother Kinzlie Harney [phone]); pt says she lives at home w/ her son; she plans to return at d/c; pt says she is unemployed and she difficulty paying for food, housing, and utilities; pt says she receives food stamps; pt says she has transportation; she does not have HH services, DME, or home oxygen; pt also confirms she does not have a PCP; pt advised she can contact her insurance for list of in-network PCPs; she also agrees to receive resources; resources placed in d/c instructions: guides to social services, and financial assistance, [facility] PCPs, [employer], [employer] [place] [facility] of [employer] in [place], and [employer] Book [employer] in [place]; copies or resources also given to pt; she will contact agencies of choice; no TOC needs; TOC signing off; please place consult if needed.  Expected Discharge Plan: Home/Self Care Barriers to Discharge: Continued Medical Work up   Patient Goals and CMS Choice Patient states their goals for this hospitalization and ongoing recovery are:: home CMS Medicare.gov Compare Post Acute Care list provided to:: Patient        Expected Discharge Plan and Services   Discharge Planning Services: CM Consult   Living arrangements for the past 2 months: Apartment                                      Prior Living Arrangements/Services Living arrangements for the past 2 months: Apartment Lives with:: Minor Children Patient language and need for interpreter reviewed:: Yes        Need for Family Participation in Patient Care: Yes (Comment) Care giver support system in  place?: Yes (comment) Current home services:  (n/a) Criminal Activity/Legal Involvement Pertinent to Current Situation/Hospitalization: No - Comment as needed  Activities of Daily Living   ADL Screening (condition at time of admission) Independently performs ADLs?: Yes (appropriate for developmental age) Is the patient deaf or have difficulty hearing?: No Does the patient have difficulty seeing, even when wearing glasses/contacts?: No Does the patient have difficulty concentrating, remembering, or making decisions?: No  Permission Sought/Granted Permission sought to share information with : Case Manager Permission granted to share information with : Yes, Verbal Permission Granted  Share Information with NAME: Case Manager     Permission granted to share info w Relationship: Denissa Cozart (mother) [phone]     Emotional Assessment Appearance:: Appears stated age Attitude/Demeanor/Rapport: Gracious Affect (typically observed): Accepting Orientation: : Oriented to Self, Oriented to Place, Oriented to  Time, Oriented to Situation Alcohol / Substance Use: Not Applicable Psych Involvement: No (comment)  Admission diagnosis:  Symptomatic anemia [D64.9] Anemia, unspecified type [D64.9] Patient Active Problem List   Diagnosis Date Noted   Symptomatic anemia 11/06/2022   Hypokalemia 11/06/2022   Tobacco use 11/06/2022   Menorrhagia 11/06/2022   Cardiomegaly 11/06/2022   Abnormal EKG 11/06/2022   Elevated blood pressure reading 07/02/2019   Hyperlipidemia 01/30/2017   Obesity (BMI 30-39.9) 01/16/2017   Anemia 01/16/2017   Tinea corporis 01/16/2017  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 [place] [address] Phone: [phone] Fax: [phone]     Social Determinants of  Health (SDOH) Social History: SDOH Screenings   Food Insecurity: Food Insecurity Present (11/08/2022)  Housing: Medium Risk (11/08/2022)  Transportation Needs: No Transportation Needs (11/08/2022)  Utilities: Not At Risk (11/08/2022)  Depression (PHQ2-9): Low Risk  (07/14/2019)  Tobacco Use: Medium Risk (11/06/2022)   SDOH Interventions: Food Insecurity Interventions: Inpatient TOC, Other (Comment) (resources given) Housing Interventions: Inpatient TOC, Other (Comment) (resources given) Transportation Interventions: Intervention Not Indicated, Inpatient TOC Utilities Interventions: Inpatient TOC (resources)   Readmission Risk Interventions     No data to display

## 2022-11-08 NOTE — Progress Notes (Signed)
AVS reviewed w/ pt and family who verbalized an understanding. PIV removed as noted. Pt dressed, to lobby via w/c - home w/ mom

## 2022-11-09 ENCOUNTER — Other Ambulatory Visit: Payer: Self-pay | Admitting: Internal Medicine

## 2022-11-09 MED ORDER — NICOTINE 14 MG/24HR TD PT24: 14.0000 mg | MEDICATED_PATCH | Freq: Every day | TRANSDERMAL | 0 refills | Status: DC | PRN

## 2022-11-09 MED ORDER — NORETHIN-ETH ESTRADIOL-FE 0.4-35 MG-MCG PO CHEW: 1.0000 | CHEWABLE_TABLET | Freq: Every day | ORAL | 11 refills | Status: DC

## 2022-11-09 MED ORDER — ACETAMINOPHEN 325 MG PO TABS: 650.0000 mg | ORAL_TABLET | Freq: Four times a day (QID) | ORAL | 0 refills | Status: DC | PRN

## 2022-11-09 MED ORDER — ONDANSETRON HCL 4 MG PO TABS: 4.0000 mg | ORAL_TABLET | Freq: Four times a day (QID) | ORAL | 0 refills | Status: DC | PRN

## 2022-11-09 NOTE — Progress Notes (Signed)
Called by nurse stating Patient went to go pick up scripts but were not there and unclear what happened as they were sent yesterday. Recalled Scripts in.

## 2022-11-10 LAB — TYPE AND SCREEN
ABO/RH(D): A POS
Antibody Screen: NEGATIVE
Unit division: 0
Unit division: 0
Unit division: 0
Unit division: 0
Unit division: 0
Unit division: 0

## 2022-11-10 LAB — BPAM RBC
Blood Product Expiration Date: 202411212359
Blood Product Expiration Date: 202411232359
Blood Product Expiration Date: 202411212359
Blood Product Expiration Date: 202411212359
Blood Product Expiration Date: 202411232359
Blood Product Expiration Date: 202411232359
ISSUE DATE / TIME: 202410301547
ISSUE DATE / TIME: 202410301244
ISSUE DATE / TIME: 202410302026
ISSUE DATE / TIME: 202410310850
ISSUE DATE / TIME: 202410311336
Unit Type and Rh: 6200
Unit Type and Rh: 6200
Unit Type and Rh: 6200
Unit Type and Rh: 6200
Unit Type and Rh: 6200
Unit Type and Rh: 6200

## 2022-11-13 ENCOUNTER — Other Ambulatory Visit (HOSPITAL_COMMUNITY)
Admission: RE | Admit: 2022-11-13 | Discharge: 2022-11-13 | Disposition: A | Discharge status: Home / Self Care | Payer: Medicaid Other | Source: Ambulatory Visit | Attending: Obstetrics and Gynecology | Admitting: Obstetrics and Gynecology

## 2022-11-13 ENCOUNTER — Ambulatory Visit: Payer: Medicaid Other | Admitting: Obstetrics and Gynecology

## 2022-11-13 ENCOUNTER — Encounter: Payer: Self-pay | Admitting: Obstetrics and Gynecology

## 2022-11-13 VITALS — BP 119/87 | HR 68 | Wt 199.0 lb

## 2022-11-13 DIAGNOSIS — N939 Abnormal uterine and vaginal bleeding, unspecified: Secondary | ICD-10-CM

## 2022-11-13 DIAGNOSIS — Z1331 Encounter for screening for depression: Secondary | ICD-10-CM | POA: Diagnosis not present

## 2022-11-13 DIAGNOSIS — D500 Iron deficiency anemia secondary to blood loss (chronic): Secondary | ICD-10-CM | POA: Diagnosis not present

## 2022-11-13 DIAGNOSIS — R9431 Abnormal electrocardiogram [ECG] [EKG]: Secondary | ICD-10-CM | POA: Diagnosis not present

## 2022-11-13 DIAGNOSIS — Z113 Encounter for screening for infections with a predominantly sexual mode of transmission: Secondary | ICD-10-CM

## 2022-11-13 DIAGNOSIS — I517 Cardiomegaly: Secondary | ICD-10-CM

## 2022-11-13 DIAGNOSIS — R7401 Elevation of levels of liver transaminase levels: Secondary | ICD-10-CM

## 2022-11-13 DIAGNOSIS — D251 Intramural leiomyoma of uterus: Secondary | ICD-10-CM | POA: Diagnosis not present

## 2022-11-13 DIAGNOSIS — D250 Submucous leiomyoma of uterus: Secondary | ICD-10-CM | POA: Insufficient documentation

## 2022-11-13 DIAGNOSIS — I340 Nonrheumatic mitral (valve) insufficiency: Secondary | ICD-10-CM

## 2022-11-13 MED ORDER — MEGESTROL ACETATE 40 MG PO TABS: 40.0000 mg | ORAL_TABLET | Freq: Two times a day (BID) | ORAL | 5 refills | Status: DC

## 2022-11-13 MED ORDER — TRANEXAMIC ACID 650 MG PO TABS
1300.0000 mg | ORAL_TABLET | Freq: Three times a day (TID) | ORAL | 0 refills | Status: AC
Start: 2022-11-13 — End: 2022-11-18

## 2022-11-13 NOTE — Progress Notes (Signed)
Patient scored positive on PHQ-9 and GAD-7. Patient denies any S.I. Informed patient about in house [facility] services. Patient would like to schedule an virtual visit with Asher Muir. Front desk to schedule appointment with Asher Muir.   Marcelino Duster, RN

## 2022-11-14 LAB — ANEMIA PROFILE B
Basophils Absolute: 0.1 10*3/uL (ref 0.0–0.2)
Basos: 1 %
EOS (ABSOLUTE): 0.2 10*3/uL (ref 0.0–0.4)
Eos: 5 %
Ferritin: 12 ng/mL — ABNORMAL LOW (ref 15–150)
Folate: 7.5 ng/mL (ref >3.0)
Hematocrit: 35.1 % (ref 34.0–46.6)
Hemoglobin: 10.8 g/dL — ABNORMAL LOW (ref 11.1–15.9)
Immature Grans (Abs): 0 10*3/uL (ref 0.0–0.1)
Immature Granulocytes: 0 %
Iron Saturation: 5 % — CL (ref 15–55)
Iron: 22 ug/dL — ABNORMAL LOW (ref 27–159)
Lymphocytes Absolute: 1.8 10*3/uL (ref 0.7–3.1)
Lymphs: 33 %
MCH: 26 pg — ABNORMAL LOW (ref 26.6–33.0)
MCHC: 30.8 g/dL — ABNORMAL LOW (ref 31.5–35.7)
MCV: 84 fL (ref 79–97)
Monocytes Absolute: 0.6 10*3/uL (ref 0.1–0.9)
Monocytes: 11 %
Neutrophils Absolute: 2.7 10*3/uL (ref 1.4–7.0)
Neutrophils: 50 %
Platelets: 278 10*3/uL (ref 150–450)
RBC: 4.16 x10E6/uL (ref 3.77–5.28)
RDW: 21.1 % — ABNORMAL HIGH (ref 11.7–15.4)
Retic Ct Pct: 2.9 % — ABNORMAL HIGH (ref 0.6–2.6)
Total Iron Binding Capacity: 424 ug/dL (ref 250–450)
UIBC: 402 ug/dL (ref 131–425)
Vitamin B-12: 333 pg/mL (ref 232–1245)
WBC: 5.3 10*3/uL (ref 3.4–10.8)

## 2022-11-14 LAB — COMPREHENSIVE METABOLIC PANEL
ALT: 14 [IU]/L (ref 0–32)
AST: 21 [IU]/L (ref 0–40)
Albumin: 4.3 g/dL (ref 3.9–4.9)
Alkaline Phosphatase: 97 [IU]/L (ref 44–121)
BUN/Creatinine Ratio: 16 (ref 9–23)
BUN: 10 mg/dL (ref 6–20)
Bilirubin Total: 0.4 mg/dL (ref 0.0–1.2)
CO2: 20 mmol/L (ref 20–29)
Calcium: 9.3 mg/dL (ref 8.7–10.2)
Chloride: 104 mmol/L (ref 96–106)
Creatinine, Ser: 0.64 mg/dL (ref 0.57–1.00)
Globulin, Total: 3.1 g/dL (ref 1.5–4.5)
Glucose: 77 mg/dL (ref 70–99)
Potassium: 4.4 mmol/L (ref 3.5–5.2)
Sodium: 139 mmol/L (ref 134–144)
Total Protein: 7.4 g/dL (ref 6.0–8.5)
eGFR: 116 mL/min/{1.73_m2} (ref >59)

## 2022-11-14 LAB — CERVICOVAGINAL ANCILLARY ONLY
Bacterial Vaginitis (gardnerella): POSITIVE — AB
Candida Glabrata: NEGATIVE
Candida Vaginitis: NEGATIVE
Comment: NEGATIVE
Comment: NEGATIVE
Comment: NEGATIVE

## 2022-11-14 LAB — TSH RFX ON ABNORMAL TO FREE T4: TSH: 2.16 u[IU]/mL (ref 0.450–4.500)

## 2022-11-14 LAB — RPR+HBSAG+HCVAB+...
HIV Screen 4th Generation wRfx: NONREACTIVE
Hep C Virus Ab: NONREACTIVE
Hepatitis B Surface Ag: NEGATIVE
RPR Ser Ql: NONREACTIVE

## 2022-11-15 LAB — CYTOLOGY - PAP
Chlamydia: NEGATIVE
Comment: NEGATIVE
Comment: NEGATIVE
Comment: NEGATIVE
Comment: NORMAL
Diagnosis: NEGATIVE
High risk HPV: NEGATIVE
Neisseria Gonorrhea: NEGATIVE
Trichomonas: NEGATIVE

## 2022-11-16 ENCOUNTER — Encounter: Payer: Self-pay | Admitting: Obstetrics and Gynecology

## 2022-11-16 HISTORY — PX: ENDOMETRIAL BIOPSY: PRO73

## 2022-11-16 NOTE — Progress Notes (Signed)
Obstetrics and Gynecology New Patient Evaluation  Appointment Date: 11/13/2022  OBGYN Clinic: [facility] for [facility]  Primary Care Provider: None  Referring Provider: [facility]  Chief Complaint: inpatient hospitalization f/u for blood transfusion for anemia and fibroids  History of Present Illness: Darlene Hayes is a 37 y.o. African-American G2P1011 (Patient's last menstrual period was 10/19/2022 (exact date).), seen for the above chief complaint. Her past medical history is significant for MV regurg, ?cardiomegaly, possible HTN, fibroids, recent Hgb of 2.6, BMI 30s, tobacco abuse  Patient presented to [facility] ED on 10/30 with s/s of anemia, no bleeding. Hgb 2.6 and patient subsequently transfused. U/s showed multiple fibroids, including submucosal ones with overall uterine size 13 x 7.5 x 8.5cm. She was subsequently transfused and discharged to home. GYN consulted and recommended patient start combined OCPs and follow up as an outpatient. She also received an echo for chest s/s which showed MVR; she was discharged on 11/1.   Interval History: She states she feels well and started having some vaginal staining today. She did not start the combined OCPs due to concerns for side effects given what they found on her echo.   Review of Systems: Pertinent items noted in HPI and remainder of comprehensive ROS otherwise negative.   Patient Active Problem List   Diagnosis Date Noted   Mitral valve insufficiency 11/13/2022   Elevated AST (SGOT) 11/13/2022   Intramural and submucous leiomyoma of uterus 11/13/2022   Chronic blood loss anemia 11/13/2022   Symptomatic anemia 11/06/2022   Tobacco use 11/06/2022   Menorrhagia 11/06/2022   Cardiomegaly 11/06/2022   Abnormal EKG 11/06/2022   Elevated blood pressure reading 07/02/2019   Hyperlipidemia 01/30/2017   Obesity (BMI 30-39.9) 01/16/2017   Anemia 01/16/2017   Tinea corporis 01/16/2017   Marijuana  smoker 03/04/2015   Smoker 10/13/2012    Past Medical History:  Past Medical History:  Diagnosis Date   Abnormal Pap smear    abn paps since 2000. no additional testing done.    Chlamydia 07/2010   treated. hasn't been with that partner since.    Gonorrhea    treated in July 2012. hasn't been with that partner since.    Postpartum depression 10/19/2015   Vaginal Pap smear, abnormal     Past Surgical History:  Past Surgical History:  Procedure Laterality Date   DILATION AND CURETTAGE OF UTERUS  12/2016   EAB done in [place]    Past Obstetrical History:  OB History  Gravida Para Term Preterm AB Living  2 1 1  0 1 1  SAB IAB Ectopic Multiple Live Births  0 1 0 0 1    # Outcome Date GA Lbr Len/2nd Weight Sex Type Anes PTL Lv  2 IAB 2019 [redacted]w[redacted]d         1 Term 09/03/15 [redacted]w[redacted]d 11:48 / 01:16 7 lb 14.8 oz (3.595 kg) M Vag-Spont EPI  LIV   Past Gynecological History: As per HPI. Periods: before January 2024, they were qmonth, short and not heavy or painful; after that, they are still qmonth but last up to 2 weeks and are heavy and painful. She did not start or stop any new medicines around this time History of Pap Smear(s): Yes.   Last pap 2021, which was negative She is currently using no method for contraception.   Social History:  Social History   Socioeconomic History   Marital status: Single    Spouse name: Not on file   Number of  children: Not on file   Years of education: Not on file   Highest education level: Not on file  Occupational History   Not on file  Tobacco Use   Smoking status: Former    Current packs/day: 0.00    Types: Cigarettes    Quit date: 01/01/2015    Years since quitting: 7.8   Smokeless tobacco: Never  Substance and Sexual Activity   Alcohol use: No   Drug use: No   Sexual activity: Not Currently    Birth control/protection: None  Other Topics Concern   Not on file  Social History Narrative   Not on file   Social Determinants of  Health   Financial Resource Strain: Not on file  Food Insecurity: Food Insecurity Present (11/13/2022)   Hunger Vital Sign    Worried About Running Out of Food in the Last Year: Sometimes true    Ran Out of Food in the Last Year: Sometimes true  Transportation Needs: No Transportation Needs (11/13/2022)   PRAPARE - Administrator, Civil Service (Medical): No    Lack of Transportation (Non-Medical): No  Physical Activity: Not on file  Stress: Not on file  Social Connections: Not on file  Intimate Partner Violence: Not At Risk (11/08/2022)   Humiliation, Afraid, Rape, and Kick questionnaire    Fear of Current or Ex-Partner: No    Emotionally Abused: No    Physically Abused: No    Sexually Abused: No    Family History:  Family History  Problem Relation Age of Onset   Hypertension Mother    Miscarriages / Stillbirths Sister    Hypertension Sister    Stroke Maternal Grandmother    Diabetes Maternal Grandmother     Medications Brittiany R. Caddell had no medications administered during this visit. Current Outpatient Medications  Medication Sig Dispense Refill   acetaminophen (TYLENOL) 325 MG tablet Take 2 tablets (650 mg total) by mouth every 6 (six) hours as needed for mild pain (pain score 1-3) (or Fever >/= 101). (Patient not taking: Reported on 11/13/2022) 20 tablet 0   nicotine (NICODERM CQ - DOSED IN MG/24 HOURS) 14 mg/24hr patch Place 1 patch (14 mg total) onto the skin daily as needed (Nicotine withdrawal). (Patient not taking: Reported on 11/13/2022) 28 patch 0   No current facility-administered medications for this visit.    Allergies Patient has no known allergies.   Physical Exam:  BP 119/87   Pulse 68   Wt 199 lb (90.3 kg)   LMP 10/19/2022 (Exact Date) Comment: lasted 7 days  BMI 33.12 kg/m  Body mass index is 33.12 kg/m. General appearance: Well nourished, well developed female in no acute distress.  Neck:  Supple, normal appearance, and no  thyromegaly  Cardiovascular: normal s1 and s2.  No murmurs, rubs or gallops. Respiratory:  Clear to auscultation bilateral. Normal respiratory effort Abdomen: positive bowel sounds and no masses, hernias; diffusely non tender to palpation, non distended Neuro/Psych:  Normal mood and affect.  Skin:  Warm and dry.  Lymphatic:  No inguinal lymphadenopathy.   Cervical exam performed in the presence of a chaperone Pelvic exam: is not limited by body habitus EGBUS: within normal limits Vagina: within normal limits and with  scant old  blood in vault; no d/c Cervix: normal appearing cervix without tenderness, discharge or lesions. Uterus:  enlarged, c/w 14 week size and non tender, mobile Adnexa:  normal adnexa and no mass, fullness, tenderness Rectovaginal: deferred  See procedure note for  embx and pap details  Laboratory: as per HPI  Radiology:  Narrative & Impression  CLINICAL DATA:  37 year old female with cough, chest pain, palpitations. Dysfunctional uterine bleeding. LMP 10/19/2022.   EXAM: TRANSABDOMINAL AND TRANSVAGINAL ULTRASOUND OF PELVIS   TECHNIQUE: Both transabdominal and transvaginal ultrasound examinations of the pelvis were performed. Transabdominal technique was performed for global imaging of the pelvis including uterus, ovaries, adnexal regions, and pelvic cul-de-sac. It was necessary to proceed with endovaginal exam following the transabdominal exam to visualize the ovaries.   COMPARISON:  Ob ultrasound 10/24/2016.   FINDINGS: Uterus   Measurements: 12.9 x 7.4 x 9.4 cm = volume: 468 mL. Multiple uterine fibroids with hypervascularity (image 97) ranging from 3.0 to 7.3 cm diameter (images 64 and 65. These generally appear to be intramural, and there is distortion of the endometrial contour (image 22) suggesting submucosal location of some lesions.   Endometrium   Thickness: 5 mm (image 122). Partially obscured by fibroid disease. No discrete endometrial  lesion.   Right ovary   Only identified transabdominally. Measurements: 2.8 x 1.9 x 2.9 cm = volume: 8 mL. Normal appearance/no adnexal mass.   Left ovary   Only identified transabdominally. Measurements: 3.1 x 2.5 x 2.5 = volume: 10 mL. Normal appearance. Normal follicle (image 53 no follow-up imaging recommended), no adnexal mass.   Other findings   No free fluid.   IMPRESSION: 1. Enlarged fibroid uterus. 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 2. Endometrium mildly distorted by fibroids, otherwise negative. 3. Normal ovaries.     Electronically Signed   By: Odessa Fleming M.D.   On: 11/06/2022 16:39   Narrative & Impression  CLINICAL DATA:  37 year old female with cough, chest pain status post COVID-19. In August. Palpitations.   EXAM: CHEST - 2 VIEW   COMPARISON:  None Available.   FINDINGS: PA and lateral views 1013 hours. Smaller trace layering pleural effusions in the posterior costophrenic sulci. Mild cardiomegaly. Other mediastinal contours are within normal limits. Visualized tracheal air column is within normal limits. No consolidation. No pneumothorax. Mild increased pulmonary interstitium but no overt edema. No confluent opacity.   No acute osseous abnormality identified. Negative visible bowel gas.   IMPRESSION: 1. Borderline to mild cardiomegaly with trace pleural effusions. 2. Pulmonary vascularity appears increased but no overt edema. Viral/atypical respiratory infection felt less likely.     Electronically Signed   By: Odessa Fleming M.D.   On: 11/06/2022 10:21   ECHOCARDIOGRAM REPORT       Patient Name:   Darlene Hayes Date of Exam: 11/07/2022 Medical Rec #:  [id number]          Height:       65.0 in Accession #:    [id number]         Weight:       208.0 lb Date of Birth:  01-13-1985          BSA:          2.011 m Patient Age:    37 years           BP:           170/100 mmHg Patient  Gender: F                  HR:           53 bpm. Exam Location:  Inpatient  Procedure: 2D Echo, Cardiac Doppler and Color Doppler  Indications:  Cardiomegaly I51.7   History:        Patient has no prior history of Echocardiogram examinations.                 Cardiomegaly; Risk Factors:Dyslipidemia and Current Smoker.   Sonographer:    Lucendia Herrlich RCS Referring Phys: [phone] DAVID MANUEL ORTIZ  IMPRESSIONS    1. Left ventricular ejection fraction, by estimation, is 50 to 55%. The left ventricle has low normal function. The left ventricle has no regional wall motion abnormalities. Left ventricular diastolic parameters are indeterminate.  2. Right ventricular systolic function is normal. The right ventricular size is normal. There is mildly elevated pulmonary artery systolic pressure. The estimated right ventricular systolic pressure is 36.9 mmHg.  3. Left atrial size was moderate to severe.  4. Moderate to severe mitral valve regurgitation.  5. The aortic valve is tricuspid. Aortic valve regurgitation is not visualized.  6. The inferior vena cava is dilated in size with <50% respiratory variability, suggesting right atrial pressure of 15 mmHg.  Conclusion(s)/Recommendation(s): MR is at least moderate. 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 recommend a TEE.  FINDINGS  Left Ventricle: Left ventricular ejection fraction, by estimation, is 50 to 55%. The left ventricle has low normal function. The left ventricle has no regional wall motion abnormalities. The left ventricular internal cavity size was normal in size. There is no left ventricular hypertrophy. Left ventricular diastolic parameters are indeterminate.  Right Ventricle: The right ventricular size is normal. Right ventricular systolic function is normal. There is mildly elevated pulmonary artery systolic pressure. The tricuspid regurgitant velocity is 2.34 m/s, and with an assumed right atrial pressure of 15  mmHg, the estimated right ventricular systolic pressure is 36.9 mmHg.  Left Atrium: Left atrial size was moderate to severe.  Right Atrium: Right atrial size was normal in size.  Pericardium: There is no evidence of pericardial effusion.  Mitral Valve: Moderate to severe mitral valve regurgitation.  Tricuspid Valve: Tricuspid valve regurgitation is mild.  Aortic Valve: The aortic valve is tricuspid. Aortic valve regurgitation is not visualized. Aortic valve peak gradient measures 16.3 mmHg.  Aorta: The aortic root and ascending aorta are structurally normal, with no evidence of dilitation.  Venous: The inferior vena cava is dilated in size with less than 50% respiratory variability, suggesting right atrial pressure of 15 mmHg.  IAS/Shunts: The interatrial septum was not well visualized.    LEFT VENTRICLE PLAX 2D LVIDd:         5.70 cm   Diastology LVIDs:         4.20 cm   LV e' medial:    9.46 cm/s LV PW:         1.10 cm   LV E/e' medial:  15.1 LV IVS:        0.90 cm   LV e' lateral:   9.25 cm/s LVOT diam:     2.00 cm   LV E/e' lateral: 15.5 LV SV:         77 LV SV Index:   38 LVOT Area:     3.14 cm    RIGHT VENTRICLE             IVC RV S prime:     10.70 cm/s  IVC diam: 2.80 cm TAPSE (M-mode): 2.8 cm  LEFT ATRIUM            Index        RIGHT ATRIUM  Index LA diam:      5.20 cm  2.59 cm/m   RA Area:     12.30 cm LA Vol (A2C): 72.0 ml  35.80 ml/m  RA Volume:   25.10 ml  12.48 ml/m LA Vol (A4C): 117.0 ml 58.17 ml/m  AORTIC VALVE AV Area (Vmax): 1.76 cm AV Vmax:        202.00 cm/s AV Peak Grad:   16.3 mmHg LVOT Vmax:      113.00 cm/s LVOT Vmean:     73.700 cm/s LVOT VTI:       0.246 m   AORTA Ao Root diam: 2.40 cm Ao Asc diam:  3.10 cm  MITRAL VALVE                TRICUSPID VALVE MV Area (PHT): 3.72 cm     TR Peak grad:   21.9 mmHg MV Decel Time: 204 msec     TR Vmax:        234.00 cm/s MR Peak grad: 117.7 mmHg MR Vmax:      542.50 cm/s    SHUNTS MV E velocity: 143.00 cm/s  Systemic VTI:  0.25 m MV A velocity: 46.70 cm/s   Systemic Diam: 2.00 cm MV E/A ratio:  3.06  [place] signed by Carolan Clines Signature Date/Time: 11/07/2022/2:17:32 PM       Final    Assessment: patient stable  Plan:  1. Abnormal uterine bleeding (AUB) I d/w her that plan is acute stop/keep her from bleeding anymore and then to finalize plans for her AUB. She is undecided on if she wants more children. 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 If not, I told her I at least would recommend a hysteroscopy and possible Myosure myomectomy for her submucosal fibroids. She is not actively trying to get pregnant so could place Mirena IUD at the same time, as well.   Any surgery will need to be on hold until she is seen by Cardiology, which is already sent up for December  I told her that combined, continuous OCPs should still be fine with her echo findings but patient doesn't want to do estrogen options. I d/w her re: other options and we settled on Lysteda five days now since she is starting to have staining today; hopefully this will stop it/keep it minimal. After the five days, start megace 40 bid; hopefully this will keep her amenorrheic.  - Cytology - PAP - Surgical pathology( [place]/ [facility]) - Anemia Profile B - Comprehensive metabolic panel - TSH Rfx on Abnormal to Free T4 - Cervicovaginal ancillary only( [place])  2. Chronic blood loss anemia I d/w her that she may need IV iron. Follow up anemia profile Referral to Heme placed  3. Intramural and submucous leiomyoma of uterus  4. Elevated AST (SGOT) - Comprehensive metabolic panel  5. Abnormal EKG  6. Mitral valve insufficiency, unspecified etiology  7. Cardiomegaly  8. Screen for STD (sexually transmitted disease) - RPR+HBsAg+HCVAb+...  9. Positive screening for depression on 9-item Patient Health Questionnaire (PHQ-9) -  Ambulatory referral to Integrated Behavioral Health  Orders Placed This Encounter  Procedures   Anemia Profile B   Comprehensive metabolic panel   TSH Rfx on Abnormal to Free T4   RPR+HBsAg+HCVAb+...   Ambulatory referral to Integrated Behavioral Health    RTC after seen by Cardiology and lab results are back.   Return if symptoms worsen or fail to improve.  Future Appointments  Date Time Provider Department Center  12/10/2022  9:15 AM [facility]  12/24/2022  8:00 AM Tobb, Lavona [place], DO [facility] None    Cornelia Copa MD Attending Center for [facility])

## 2022-11-16 NOTE — Procedures (Signed)
Endometrial Biopsy Procedure Note  Pre-operative Diagnosis: AUB. Fibroids. Anemia  Post-operative Diagnosis: same  Procedure Details  Cervical exam performed in the presence of a chaperone Urine pregnancy test was not done; patient denies any sex since hospitalization.   The risks (including infection, bleeding, pain, and uterine perforation) and benefits of the procedure were explained to the patient and Written informed consent was obtained.  The patient was placed in the dorsal lithotomy position.  Bimanual exam showed the uterus to be in the neutral position.  A Graves' speculum inserted in the vagina, and the cervix was visualized and a pap smear performed. The cervix was then prepped with povidone iodine, and a sharp tenaculum was applied to the anterior lip of the cervix for stabilization.  A pipelle was inserted into the uterine cavity and sounded the uterus to a depth of 7.5cm.  A Minimal amount of tissue was collected after 2 passes. The sample was sent for pathologic examination.  Condition: Stable  Complications: None  Plan: The patient was advised to call for any fever or for prolonged or severe pain or bleeding. She was advised to use OTC analgesics as needed for mild to moderate pain. She was advised to avoid vaginal intercourse for 48 hours or until the bleeding has completely stopped.  Cornelia Copa MD Attending Center for [facility])

## 2022-11-18 LAB — SURGICAL PATHOLOGY

## 2022-11-19 ENCOUNTER — Encounter: Payer: Self-pay | Admitting: Obstetrics and Gynecology

## 2022-11-19 MED ORDER — METRONIDAZOLE 500 MG PO TABS: 500.0000 mg | ORAL_TABLET | Freq: Two times a day (BID) | ORAL | 0 refills | Status: AC

## 2022-11-19 NOTE — Addendum Note (Signed)
Addended by: Bo[place]on Bing on: 11/19/2022 08:05 AM   Modules accepted: Orders

## 2022-11-20 ENCOUNTER — Telehealth: Payer: Self-pay | Admitting: Obstetrics and Gynecology

## 2022-11-20 NOTE — Telephone Encounter (Addendum)
GYN Telephone Note Patient started normal flow the day after her visit with me on 11/6 which is when she started the Lysteda. Her flow initially was heavy for the first two days and her bleeding is only scant now. She is wondering if she should start the megace now and same with the flagyl  I told her that it sounded like the Lysteda helped with her period, and, yes, to start the megace 40 bid now with the goal of it being to keep her amenorrheic and bleeding in the future. I also told her that it's fine to start the flagyl now   I also d/w her re: her fibroids. 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 I d/w her re: vaginal myomectomy as well as open or l/s myomectomy. I told her that, at the very least, she needs a hysteroscopy and to remove any submucosal fibroids seen. If she wanted to see how her s/s were after that she certaintly could. I also told her that if she could consider doing a vaginal myomectomy and/or fibroid removal through the abdomen. Follow up after cardiology appointment on 12/17  Dr. Vergie Living

## 2022-11-21 ENCOUNTER — Encounter: Payer: Self-pay | Admitting: Obstetrics and Gynecology

## 2022-11-25 ENCOUNTER — Other Ambulatory Visit: Payer: Self-pay | Admitting: Obstetrics and Gynecology

## 2022-11-25 MED ORDER — NORETHINDRONE ACETATE 5 MG PO TABS: 5.0000 mg | ORAL_TABLET | Freq: Every day | ORAL | 2 refills | Status: DC

## 2022-11-26 NOTE — Progress Notes (Unsigned)
[facility] CONSULT NOTE  Patient Care Team: Patient, No Pcp Per as PCP - General (General Practice)  ASSESSMENT & PLAN:  @AGE  female with history of fibroids, abnormal uterine bleeding being seen for iron deficiency anemia.  Relevant history: History multiple uterine fibroids, AUB Last colonoscopy: Last EGD: Periods: heavy  The mechanism of IDA is due to either blood loss or decreased absorptive mechanism or both. We discussed some of the risks, benefits, and alternatives of intravenous iron infusions. The patient is symptomatic from anemia and the iron level is critically low. She tolerated oral iron supplement poorly and desires to achieved higher levels of iron faster for adequate hematopoesis. Some of the side-effects to be expected including risks of infusion reactions, phlebitis, headaches, nausea and fatigue.  The patient is willing to proceed. Patient education material was dispensed.   IV iron *** ordered. CBC, Iron, TIBC, ferritin Referral for colonoscopy and EGD  All questions were answered. The patient knows to call the clinic with any problems, questions or concerns.  Melven Sartorius, MD 11/19/202410:35 AM   CHIEF COMPLAINTS/PURPOSE OF CONSULTATION:  Anemia  HISTORY OF PRESENTING ILLNESS:  Darlene Hayes 37 y.o. female is here because of anemia.  Patient presented to ED on 11/06/2022.  Hemoglobin was 2.6.  MCV was 71.  She received 5 units of PRBC.  Ferritin was 2 and iron saturation was 3%.  11/06/22 [place]: 1. Enlarged fibroid uterus. 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 2. Endometrium mildly distorted by fibroids, otherwise negative. 3. Normal ovaries.  11/13/22 Ferritin 12 iron saturation 5% hemoglobin 10.8  B12 333 folate 7.5  She has seen OB/GYN and currently is on aygestin.    Darlene Hayes had not noticed any recent bleeding such as melena, hematuria or hematochezia. There is no  history of celiac disease, gastric bypass surgery.  She has not had any colonoscopy.  ***She denies any pica and eats a variety of diet. ***She never donated blood or received blood transfusion ***The patient was prescribed oral iron supplements and she takes ***  MEDICAL HISTORY:  Past Medical History:  Diagnosis Date   Abnormal Pap smear    abn paps since 2000. no additional testing done.    Chlamydia 07/2010   treated. hasn't been with that partner since.    Gonorrhea    treated in July 2012. hasn't been with that partner since.    Postpartum depression 10/19/2015   Vaginal Pap smear, abnormal     SURGICAL HISTORY: Past Surgical History:  Procedure Laterality Date   DILATION AND CURETTAGE OF UTERUS  12/2016   EAB done in [place]   ENDOMETRIAL BIOPSY  11/16/2022    SOCIAL HISTORY: Social History   Socioeconomic History   Marital status: Single    Spouse name: Not on file   Number of children: Not on file   Years of education: Not on file   Highest education level: Not on file  Occupational History   Not on file  Tobacco Use   Smoking status: Former    Current packs/day: 0.00    Types: Cigarettes    Quit date: 01/01/2015    Years since quitting: 7.9   Smokeless tobacco: Never  Substance and Sexual Activity   Alcohol use: No   Drug use: No   Sexual activity: Not Currently    Birth control/protection: None  Other Topics Concern   Not on file  Social History Narrative   Not on  file   Social Determinants of Health   Financial Resource Strain: Not on file  Food Insecurity: Food Insecurity Present (11/13/2022)   Hunger Vital Sign    Worried About Running Out of Food in the Last Year: Sometimes true    Ran Out of Food in the Last Year: Sometimes true  Transportation Needs: No Transportation Needs (11/13/2022)   PRAPARE - Administrator, Civil Service (Medical): No    Lack of Transportation (Non-Medical): No  Physical Activity: Not on file   Stress: Not on file  Social Connections: Not on file  Intimate Partner Violence: Not At Risk (11/08/2022)   Humiliation, Afraid, Rape, and Kick questionnaire    Fear of Current or Ex-Partner: No    Emotionally Abused: No    Physically Abused: No    Sexually Abused: No    FAMILY HISTORY: Family History  Problem Relation Age of Onset   Hypertension Mother    Miscarriages / Stillbirths Sister    Hypertension Sister    Stroke Maternal Grandmother    Diabetes Maternal Grandmother     ALLERGIES:  has No Known Allergies.  MEDICATIONS:  Current Outpatient Medications  Medication Sig Dispense Refill   acetaminophen (TYLENOL) 325 MG tablet Take 2 tablets (650 mg total) by mouth every 6 (six) hours as needed for mild pain (pain score 1-3) (or Fever >/= 101). (Patient not taking: Reported on 11/13/2022) 20 tablet 0   metroNIDAZOLE (FLAGYL) 500 MG tablet Take 1 tablet (500 mg total) by mouth 2 (two) times daily for 7 days. 14 tablet 0   nicotine (NICODERM CQ - DOSED IN MG/24 HOURS) 14 mg/24hr patch Place 1 patch (14 mg total) onto the skin daily as needed (Nicotine withdrawal). (Patient not taking: Reported on 11/13/2022) 28 patch 0   norethindrone (AYGESTIN) 5 MG tablet Take 1 tablet (5 mg total) by mouth daily. 30 tablet 2   No current facility-administered medications for this visit.    REVIEW OF SYSTEMS:   Respiratory: Denies shortness of breath Cardiovascular: Denies chest pain, chest discomfort Gastrointestinal:  Denies nausea, abdominal pain, melena or bloody stools GU: no hematuria Skin: skin color change All other systems were reviewed with the patient and are negative.  PHYSICAL EXAMINATION: ECOG PERFORMANCE STATUS: {CHL ONC ECOG PS:[phone]}  There were no vitals filed for this visit. There were no vitals filed for this visit.  GENERAL: alert, no distress and comfortable SKIN: skin color normal EYES: normal conjunctiva, sclera clear LUNGS: normal breathing  effort HEART: regular rate & rhythm ABDOMEN: abdomen soft, non-tender and nondistended  RADIOGRAPHIC STUDIES: I have personally reviewed the radiological images as listed and agreed with the findings in the report. ECHOCARDIOGRAM COMPLETE  Result Date: 11/07/2022    ECHOCARDIOGRAM REPORT   Patient Name:   Darlene Hayes Date of Exam: 11/07/2022 Medical Rec #:  [id number]          Height:       65.0 in Accession #:    [id number]         Weight:       208.0 lb Date of Birth:  1985/02/08          BSA:          2.011 m Patient Age:    37 years           BP:           170/100 mmHg Patient Gender: F  HR:           53 bpm. Exam Location:  Inpatient Procedure: 2D Echo, Cardiac Doppler and Color Doppler Indications:    Cardiomegaly I51.7  History:        Patient has no prior history of Echocardiogram examinations.                 Cardiomegaly; Risk Factors:Dyslipidemia and Current Smoker.  Sonographer:    Lucendia Herrlich RCS Referring Phys: [id number] DAVID MANUEL ORTIZ IMPRESSIONS  1. Left ventricular ejection fraction, by estimation, is 50 to 55%. The left ventricle has low normal function. The left ventricle has no regional wall motion abnormalities. Left ventricular diastolic parameters are indeterminate.  2. Right ventricular systolic function is normal. The right ventricular size is normal. There is mildly elevated pulmonary artery systolic pressure. The estimated right ventricular systolic pressure is 36.9 mmHg.  3. Left atrial size was moderate to severe.  4. Moderate to severe mitral valve regurgitation.  5. The aortic valve is tricuspid. Aortic valve regurgitation is not visualized.  6. The inferior vena cava is dilated in size with <50% respiratory variability, suggesting right atrial pressure of 15 mmHg. Conclusion(s)/Recommendation(s): MR is at least moderate. 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 recommend a TEE. FINDINGS  Left Ventricle: Left ventricular ejection fraction, by  estimation, is 50 to 55%. The left ventricle has low normal function. The left ventricle has no regional wall motion abnormalities. The left ventricular internal cavity size was normal in size. There is no left ventricular hypertrophy. Left ventricular diastolic parameters are indeterminate. Right Ventricle: The right ventricular size is normal. Right ventricular systolic function is normal. There is mildly elevated pulmonary artery systolic pressure. The tricuspid regurgitant velocity is 2.34 m/s, and with an assumed right atrial pressure of 15 mmHg, the estimated right ventricular systolic pressure is 36.9 mmHg. Left Atrium: Left atrial size was moderate to severe. Right Atrium: Right atrial size was normal in size. Pericardium: There is no evidence of pericardial effusion. Mitral Valve: Moderate to severe mitral valve regurgitation. Tricuspid Valve: Tricuspid valve regurgitation is mild. Aortic Valve: The aortic valve is tricuspid. Aortic valve regurgitation is not visualized. Aortic valve peak gradient measures 16.3 mmHg. Aorta: The aortic root and ascending aorta are structurally normal, with no evidence of dilitation. Venous: The inferior vena cava is dilated in size with less than 50% respiratory variability, suggesting right atrial pressure of 15 mmHg. IAS/Shunts: The interatrial septum was not well visualized.  LEFT VENTRICLE PLAX 2D LVIDd:         5.70 cm   Diastology LVIDs:         4.20 cm   LV e' medial:    9.46 cm/s LV PW:         1.10 cm   LV E/e' medial:  15.1 LV IVS:        0.90 cm   LV e' lateral:   9.25 cm/s LVOT diam:     2.00 cm   LV E/e' lateral: 15.5 LV SV:         77 LV SV Index:   38 LVOT Area:     3.14 cm  RIGHT VENTRICLE             IVC RV S prime:     10.70 cm/s  IVC diam: 2.80 cm TAPSE (M-mode): 2.8 cm LEFT ATRIUM            Index        RIGHT ATRIUM  Index LA diam:      5.20 cm  2.59 cm/m   RA Area:     12.30 cm LA Vol (A2C): 72.0 ml  35.80 ml/m  RA Volume:   25.10 ml  12.48  ml/m LA Vol (A4C): 117.0 ml 58.17 ml/m  AORTIC VALVE AV Area (Vmax): 1.76 cm AV Vmax:        202.00 cm/s AV Peak Grad:   16.3 mmHg LVOT Vmax:      113.00 cm/s LVOT Vmean:     73.700 cm/s LVOT VTI:       0.246 m  AORTA Ao Root diam: 2.40 cm Ao Asc diam:  3.10 cm MITRAL VALVE                TRICUSPID VALVE MV Area (PHT): 3.72 cm     TR Peak grad:   21.9 mmHg MV Decel Time: 204 msec     TR Vmax:        234.00 cm/s MR Peak grad: 117.7 mmHg MR Vmax:      542.50 cm/s   SHUNTS MV E velocity: 143.00 cm/s  Systemic VTI:  0.25 m MV A velocity: 46.70 cm/s   Systemic Diam: 2.00 cm MV E/A ratio:  3.06 [place] signed by Carolan Clines Signature Date/Time: 11/07/2022/2:17:32 PM    Final    US PELVIC COMPLETE WITH TRANSVAGINAL  Result Date: 11/06/2022 CLINICAL DATA:  37 year old female with cough, chest pain, palpitations. Dysfunctional uterine bleeding. LMP 10/19/2022. EXAM: TRANSABDOMINAL AND TRANSVAGINAL ULTRASOUND OF PELVIS TECHNIQUE: Both transabdominal and transvaginal ultrasound examinations of the pelvis were performed. Transabdominal technique was performed for global imaging of the pelvis including uterus, ovaries, adnexal regions, and pelvic cul-de-sac. It was necessary to proceed with endovaginal exam following the transabdominal exam to visualize the ovaries. COMPARISON:  Ob ultrasound 10/24/2016. FINDINGS: Uterus Measurements: 12.9 x 7.4 x 9.4 cm = volume: 468 mL. Multiple uterine fibroids with hypervascularity (image 97) ranging from 3.0 to 7.3 cm diameter (images 64 and 65. These generally appear to be intramural, and there is distortion of the endometrial contour (image 22) suggesting submucosal location of some lesions. Endometrium Thickness: 5 mm (image 122). Partially obscured by fibroid disease. No discrete endometrial lesion. Right ovary Only identified transabdominally. Measurements: 2.8 x 1.9 x 2.9 cm = volume: 8 mL. Normal appearance/no adnexal mass. Left ovary Only identified  transabdominally. Measurements: 3.1 x 2.5 x 2.5 = volume: 10 mL. Normal appearance. Normal follicle (image 53 no follow-up imaging recommended), no adnexal mass. Other findings No free fluid. IMPRESSION: 1. Enlarged fibroid uterus. 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 2. Endometrium mildly distorted by fibroids, otherwise negative. 3. Normal ovaries. Electronically Signed   By: Odessa Fleming M.D.   On: 11/06/2022 16:39   DG Chest 2 View  Result Date: 11/06/2022 CLINICAL DATA:  38 year old female with cough, chest pain status post COVID-19. In August. Palpitations. EXAM: CHEST - 2 VIEW COMPARISON:  None Available. FINDINGS: PA and lateral views 1013 hours. Smaller trace layering pleural effusions in the posterior costophrenic sulci. Mild cardiomegaly. Other mediastinal contours are within normal limits. Visualized tracheal air column is within normal limits. No consolidation. No pneumothorax. Mild increased pulmonary interstitium but no overt edema. No confluent opacity. No acute osseous abnormality identified. Negative visible bowel gas. IMPRESSION: 1. Borderline to mild cardiomegaly with trace pleural effusions. 2. Pulmonary vascularity appears increased but no overt edema. Viral/atypical respiratory infection felt less likely. Electronically Signed  By: Odessa Fleming M.D.   On: 11/06/2022 10:21

## 2022-11-27 NOTE — BH Specialist Note (Signed)
Integrated Behavioral Health via Telemedicine Visit  12/10/2022 Darlene Hayes [id number]  Number of Integrated Behavioral Health Clinician visits: 1- Initial Visit  Session Start time: [phone]   Session End time: 0935  Total time in minutes: 17   Referring Provider: Canadian Bing, MD Patient/Family location: Home [facility] Provider location: Center for [facility]  All persons participating in visit: Patient Darlene Hayes and [facility] Darlene Hayes   Types of Service: Individual psychotherapy and Video visit  I connected with Darlene Hayes and/or Darlene Hayes's  n/a  via  Telephone or Video Enabled Telemedicine Application  (Video is Caregility application) and verified that I am speaking with the correct person using two identifiers. Discussed confidentiality: Yes   I discussed the limitations of telemedicine and the availability of in person appointments.  Discussed there is a possibility of technology failure and discussed alternative modes of communication if that failure occurs.  I discussed that engaging in this telemedicine visit, they consent to the provision of behavioral healthcare and the services will be billed under their insurance.  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 Pt is coping by "taking it one day at a time"; open to implementing brief coping strategy to manage anxiety and pain.  Duration of problem: Recent; Severity of problem: moderate  Patient and/or Family's Strengths/Protective Factors: Concrete supports in place (healthy food, safe  environments, etc.) and Sense of purpose  Goals Addressed: Patient will:  Reduce symptoms of: anxiety, depression, and stress   Increase knowledge and/or ability of: self-management skills   Demonstrate ability to: Increase healthy adjustment to current life circumstances  Progress towards Goals: Ongoing  Interventions: Interventions utilized:  Mindfulness or Management consultant and Psychoeducation and/or Health Education Standardized Assessments completed: Not Needed  Patient and/or Family Response: Patient agrees with treatment plan.   Assessment: Patient currently experiencing Adjustment disorder with mixed anxious and depressed mood.   Patient may benefit from psychoeducation and brief therapeutic interventions regarding coping with symptoms of anxiety and depression .  Plan: Follow up with behavioral health clinician on : One month Behavioral recommendations:  -CALM relaxation breathing exercise twice daily (morning; at bedtime with sleep sounds); as needed throughout the day. -Continue plan to attend all upcoming medical appointments; keeping a hopeful outlook Referral(s): Integrated [employer] (In Clinic)  I discussed the assessment and treatment plan with the patient and/or parent/guardian. They were provided an opportunity to ask questions and all were answered. They agreed with the plan and demonstrated an understanding of the instructions.   They were advised to call back or seek an in-person evaluation if the symptoms worsen or if the condition fails to improve as anticipated.  Valetta Close Darlene Kontz, LCSW     11/13/2022    3:51 PM 07/14/2019   10:30 AM 07/02/2019   10:23 AM 10/19/2015    4:28 PM  Depression screen PHQ 2/9  Decreased Interest 2 0 0 2  Down, Depressed, Hopeless 1 0 0 3  PHQ - 2 Score 3 0 0 5  Altered sleeping 3 0 1 3  Tired, decreased energy 3 1 1 2   Change in appetite 3 1 1 2   Feeling bad  or failure about yourself  0 0 0 1  Trouble  concentrating 0 0 0 1  Moving slowly or fidgety/restless 1 0 0 1  Suicidal thoughts 0 0 0 0  PHQ-9 Score 13 2 3 15       11/13/2022    3:51 PM 07/14/2019   10:30 AM 07/02/2019   10:23 AM 10/19/2015    4:28 PM  GAD 7 : Generalized Anxiety Score  Nervous, Anxious, on Edge 2 0 0 3  Control/stop worrying 3 0 0 3  Worry too much - different things 3 0 1 3  Trouble relaxing 2 0 0 2  Restless 0 0 0 2  Easily annoyed or irritable 1 0 0 3  Afraid - awful might happen 3 0 0 3  Total GAD 7 Score 14 0 1 19  Anxiety Difficulty   Not difficult at all

## 2022-11-28 ENCOUNTER — Inpatient Hospital Stay: Payer: Medicaid Other

## 2022-11-28 ENCOUNTER — Other Ambulatory Visit: Payer: Self-pay

## 2022-11-28 VITALS — BP 139/91 | HR 80 | Temp 97.9°F | Resp 16 | Wt 203.8 lb

## 2022-11-28 DIAGNOSIS — R5383 Other fatigue: Secondary | ICD-10-CM | POA: Diagnosis not present

## 2022-11-28 DIAGNOSIS — Z833 Family history of diabetes mellitus: Secondary | ICD-10-CM | POA: Diagnosis not present

## 2022-11-28 DIAGNOSIS — Z8249 Family history of ischemic heart disease and other diseases of the circulatory system: Secondary | ICD-10-CM | POA: Diagnosis not present

## 2022-11-28 DIAGNOSIS — Z793 Long term (current) use of hormonal contraceptives: Secondary | ICD-10-CM

## 2022-11-28 DIAGNOSIS — Z86018 Personal history of other benign neoplasm: Secondary | ICD-10-CM | POA: Diagnosis not present

## 2022-11-28 DIAGNOSIS — Z139 Encounter for screening, unspecified: Secondary | ICD-10-CM

## 2022-11-28 DIAGNOSIS — D509 Iron deficiency anemia, unspecified: Secondary | ICD-10-CM | POA: Insufficient documentation

## 2022-11-28 DIAGNOSIS — N938 Other specified abnormal uterine and vaginal bleeding: Secondary | ICD-10-CM | POA: Diagnosis not present

## 2022-11-28 DIAGNOSIS — E538 Deficiency of other specified B group vitamins: Secondary | ICD-10-CM

## 2022-11-28 DIAGNOSIS — J90 Pleural effusion, not elsewhere classified: Secondary | ICD-10-CM | POA: Diagnosis not present

## 2022-11-28 DIAGNOSIS — Z8616 Personal history of COVID-19: Secondary | ICD-10-CM

## 2022-11-28 DIAGNOSIS — Z87891 Personal history of nicotine dependence: Secondary | ICD-10-CM

## 2022-11-28 DIAGNOSIS — Z823 Family history of stroke: Secondary | ICD-10-CM | POA: Diagnosis not present

## 2022-11-28 DIAGNOSIS — D500 Iron deficiency anemia secondary to blood loss (chronic): Secondary | ICD-10-CM

## 2022-11-28 DIAGNOSIS — I517 Cardiomegaly: Secondary | ICD-10-CM

## 2022-11-28 DIAGNOSIS — D250 Submucous leiomyoma of uterus: Secondary | ICD-10-CM | POA: Diagnosis not present

## 2022-11-28 DIAGNOSIS — Z79899 Other long term (current) drug therapy: Secondary | ICD-10-CM

## 2022-11-28 NOTE — Progress Notes (Signed)
[facility] Clinical Social Work  Clinical Social Work was referred by medical provider for assessment of psychosocial needs.  Clinical Social Worker contacted patient by phone to offer support and assess for needs. CSW reviewed SDOH needs (mental health / food). Patient reported not feeling depressive feelings at this time, and stated experiencing those feelings while in hospital. Patient has not been connected to mental health care (my chart message sent with two options). No SI/HI. Patient received resources for food at hospital. No follow up at this time.  Marguerita Merles, LCSWA Clinical Social Worker [facility]

## 2022-12-02 ENCOUNTER — Telehealth: Payer: Self-pay | Admitting: Family Medicine

## 2022-12-02 NOTE — Telephone Encounter (Signed)
Patient called in stating she is Dr. Vergie Living patient and needs help she started her cycle early so was unable to start the medication prescribed to her. She is going for open heart surgery, she is anemic and losing all this blood is not helping her.

## 2022-12-03 HISTORY — PX: TEE WITHOUT CARDIOVERSION: SHX5443

## 2022-12-03 NOTE — Telephone Encounter (Signed)
Called and spoke with patient. She reports she finished the Lysteda earlier in the month.  She started bleeding 1.5 weeks later. She reports Megace made her chest hurt.   She reports she needs to get her bleeding under control. Reviewed that we recommend she start the Aygestin to control the bleeding. She reports she thought she was supposed to start it 3 days prior to menses, reviewed it was written to take daily. She did not start the Aygestin as she is getting messages from the nurse and cardiologist that she should not take since getting ready to have open heart surgery.   She is requesting that Dr. Vergie Living reach out to the Cardiologist Dr. Warnell Bureau to coordinate care.   Will route to Dr. Vergie Living for advisement. Reviewed we will reach back out once we hear back from Dr. Vergie Living.   Patient requested a phone call and can speak with her Aunt if she is not available. She prefers this over a [employer].

## 2022-12-04 ENCOUNTER — Telehealth: Payer: Self-pay | Admitting: Obstetrics and Gynecology

## 2022-12-04 NOTE — Telephone Encounter (Addendum)
Late entry for 12/03/2022 at 1630 Dr. Burman Blacksmith office called at [phone] but it states number is not a working number. Will try tomorrow [place] Bing, Montez Hageman MD Attending Center for [facility])

## 2022-12-06 ENCOUNTER — Inpatient Hospital Stay: Payer: Medicaid Other

## 2022-12-06 ENCOUNTER — Telehealth: Payer: Self-pay | Admitting: Obstetrics and Gynecology

## 2022-12-06 VITALS — BP 109/54 | HR 69 | Temp 98.1°F | Resp 16 | Wt 210.0 lb

## 2022-12-06 DIAGNOSIS — D500 Iron deficiency anemia secondary to blood loss (chronic): Secondary | ICD-10-CM

## 2022-12-06 MED ORDER — SODIUM CHLORIDE 0.9 % IV SOLN
510.0000 mg | Freq: Once | INTRAVENOUS | Status: AC
  Administered 2022-12-06: 510 mg via INTRAVENOUS
  Filled 2022-12-06: qty 510

## 2022-12-06 MED ORDER — SODIUM CHLORIDE 0.9 % IV SOLN: INTRAVENOUS | Status: DC

## 2022-12-06 NOTE — Progress Notes (Signed)
Patient observed for 30 minutes following administration of Feraheme infusion. Vital signs retaken and remained stable. Patient showed no signs of distress upon discharge.

## 2022-12-06 NOTE — Patient Instructions (Signed)
 Ferumoxytol Injection What is this medication? FERUMOXYTOL (FER ue MOX i tol) treats low levels of iron in your body (iron deficiency anemia). Iron is a mineral that plays an important role in making red blood cells, which carry oxygen from your lungs to the rest of your body. This medicine may be used for other purposes; ask your health care provider or pharmacist if you have questions. COMMON BRAND NAME(S): Feraheme What should I tell my care team before I take this medication? They need to know if you have any of these conditions: Anemia not caused by low iron levels High levels of iron in the blood Magnetic resonance imaging (MRI) test scheduled An unusual or allergic reaction to iron, other medications, foods, dyes, or preservatives Pregnant or trying to get pregnant Breastfeeding How should I use this medication? This medication is injected into a vein. It is given by your care team in a hospital or clinic setting. Talk to your care team the use of this medication in children. Special care may be needed. Overdosage: If you think you have taken too much of this medicine contact a poison control center or emergency room at once. NOTE: This medicine is only for you. Do not share this medicine with others. What if I miss a dose? It is important not to miss your dose. Call your care team if you are unable to keep an appointment. What may interact with this medication? Other iron products This list may not describe all possible interactions. Give your health care provider a list of all the medicines, herbs, non-prescription drugs, or dietary supplements you use. Also tell them if you smoke, drink alcohol, or use illegal drugs. Some items may interact with your medicine. What should I watch for while using this medication? Visit your care team regularly. Tell your care team if your symptoms do not start to get better or if they get worse. You may need blood work done while you are taking this  medication. You may need to follow a special diet. Talk to your care team. Foods that contain iron include: whole grains/cereals, dried fruits, beans, or peas, leafy green vegetables, and organ meats (liver, kidney). What side effects may I notice from receiving this medication? Side effects that you should report to your care team as soon as possible: Allergic reactions--skin rash, itching, hives, swelling of the face, lips, tongue, or throat Low blood pressure--dizziness, feeling faint or lightheaded, blurry vision Shortness of breath Side effects that usually do not require medical attention (report to your care team if they continue or are bothersome): Flushing Headache Joint pain Muscle pain Nausea Pain, redness, or irritation at injection site This list may not describe all possible side effects. Call your doctor for medical advice about side effects. You may report side effects to FDA at 1-800-FDA-1088. Where should I keep my medication? This medication is given in a hospital or clinic. It will not be stored at home. NOTE: This sheet is a summary. It may not cover all possible information. If you have questions about this medicine, talk to your doctor, pharmacist, or health care provider.  2024 Elsevier/Gold Standard (2022-05-31 00:00:00)

## 2022-12-06 NOTE — Telephone Encounter (Signed)
GYN Telephone Note Late entry for 11/27 at 1500 I called Dr. Burman Blacksmith office at [phone] and left a message with the secretary for him to call me on my cell phone anytime due to patient's concern about starting daily aygestin with her cardiac history.  Cornelia Copa MD Attending Center for [employer] [facility])

## 2022-12-07 ENCOUNTER — Inpatient Hospital Stay: Payer: Medicaid Other

## 2022-12-10 ENCOUNTER — Ambulatory Visit: Payer: Medicaid Other | Admitting: Clinical

## 2022-12-10 ENCOUNTER — Telehealth: Payer: Self-pay | Admitting: Family Medicine

## 2022-12-10 DIAGNOSIS — F4323 Adjustment disorder with mixed anxiety and depressed mood: Secondary | ICD-10-CM

## 2022-12-10 NOTE — Telephone Encounter (Signed)
Returned call to patient.   Patient reports she is still bleeding. She has been passing clots from 11/6-11/12 and then resumed on 11/25 and is still ongoing. She is wearing depends and is changing 2-3 times a day. She is having clots the size of golf balls or larger she is passing those about 7-10 times a day.   She started taking Aygestin on 11/21. The bleeding is not improved, it is the same.   She is fatigued, no dizziness.   She saw her Cardiologist on 11/21 and had a TEE done at that time. They report her issues are improving some and is considered to be on the moderate side. Not sure if she would need open heart surgery at this time.   Cardiologist was agreeable to Aygestin but concerned it may conflict for Furosemide she was put on at that appointment.   Informed patient that Dr. Vergie Living did reach out to Dr. Coralee Pesa and [facility] for him to return call. Reviewed I am not sure if Dr. Vergie Living has been able to speak with him.   Will route message to Dr. Vergie Living for advisement. Reviewed patient will be called once we hear back from Dr. Vergie Living. Patient voiced understanding.

## 2022-12-10 NOTE — Telephone Encounter (Signed)
Patient called to set up follow up appt with Dr.Pickens. She says that she has been bleeding uncontrollably for the month of November and has been taking the medication that was prescribed at her last visit but it has not stopped the bleeding. Patient requests that a nurse calls her back to go over the medication and talk about the bleeding.

## 2022-12-10 NOTE — Patient Instructions (Signed)
[facility] [address] [place], [place] [address] [phone] (main office) [phone] (Aquan Kope's office)   

## 2022-12-11 ENCOUNTER — Encounter: Payer: Self-pay | Admitting: Obstetrics and Gynecology

## 2022-12-11 ENCOUNTER — Telehealth: Payer: Self-pay | Admitting: Obstetrics and Gynecology

## 2022-12-11 ENCOUNTER — Ambulatory Visit: Payer: Medicaid Other | Admitting: Certified Nurse Midwife

## 2022-12-11 NOTE — Telephone Encounter (Addendum)
GYN Telephone Note Patient called at [phone]   See RN note  Patient states that she bled for a week after I saw her on 11/6 and then her bleeding restarted about 10 days after that and ever since. She states she started the aygestin 5mg  daily on 11/22 and has been on it ever since. She states that she uses pads and uses about 3-4 per day and no recent blood work  She had her cardiac w/u done in [place] because she was able to get it sooner than her because of a relative that lives there and she states her cardiologist put her on PRN lasix for SOB and swelling which she has only needed once or twice. She also states her TTE showed her MVR is moderate from severe and she has f/u with him in early February   Started aygestin on 11/22 daily. Pads 3-4 days she says saturated. No recent bloodwork  Message sent to Dr. Cherly Hensen to see if she can get a CBC at her 12/7 IV iron infusion I also told her to keep her 12/17 cardiology appt here in [place] b/c if she has surgery it would be around here I told her that her options are really limited to surgical. I recommend increasing her aygestin 5mg  from qday to bid and that I'm not optimistic that switching to different pill or trying depo provera would help in her case. I told her that she's really limited to myomectomy or hysterectomy. She states that she wants a myomectomy; sonata previously d/w her but she is not a candidate.   I told her I can do an open myomectomy but will message my partner to see about if she is a candidate for l/s approach.   Cornelia Copa MD Attending [facility] [employer] (Faculty Practice) 12/11/2022 Time: [phone]

## 2022-12-12 ENCOUNTER — Telehealth: Payer: Self-pay

## 2022-12-12 DIAGNOSIS — D509 Iron deficiency anemia, unspecified: Secondary | ICD-10-CM

## 2022-12-12 NOTE — Telephone Encounter (Signed)
TC to pt per Dr. Nelta Numbers request to inform that he wants her to have lab work done prior to her iron infusion on 12/14/22. Notified to check in at 8:45 for lab appointment. Pt states that she may have an issue with making her appointment due to childcare. Asked that she call Dr. Nelta Numbers office tomorrow to notify us if she has to cancel the appointment--she verbalizes understanding.

## 2022-12-13 ENCOUNTER — Telehealth: Payer: Self-pay | Admitting: Obstetrics and Gynecology

## 2022-12-14 ENCOUNTER — Inpatient Hospital Stay: Payer: Medicaid Other

## 2022-12-14 VITALS — BP 109/59 | HR 65 | Temp 97.9°F | Resp 18

## 2022-12-14 DIAGNOSIS — Z79899 Other long term (current) drug therapy: Secondary | ICD-10-CM | POA: Diagnosis not present

## 2022-12-14 DIAGNOSIS — D509 Iron deficiency anemia, unspecified: Secondary | ICD-10-CM | POA: Diagnosis present

## 2022-12-14 DIAGNOSIS — D500 Iron deficiency anemia secondary to blood loss (chronic): Secondary | ICD-10-CM

## 2022-12-14 LAB — CBC WITH DIFFERENTIAL (CANCER CENTER ONLY)
Abs Immature Granulocytes: 0.01 10*3/uL (ref 0.00–0.07)
Basophils Absolute: 0 10*3/uL (ref 0.0–0.1)
Basophils Relative: 1 %
Eosinophils Absolute: 0.1 10*3/uL (ref 0.0–0.5)
Eosinophils Relative: 3 %
HCT: 31 % — ABNORMAL LOW (ref 36.0–46.0)
Hemoglobin: 9.6 g/dL — ABNORMAL LOW (ref 12.0–15.0)
Immature Granulocytes: 0 %
Lymphocytes Relative: 24 %
Lymphs Abs: 1.1 10*3/uL (ref 0.7–4.0)
MCH: 27.7 pg (ref 26.0–34.0)
MCHC: 31 g/dL (ref 30.0–36.0)
MCV: 89.3 fL (ref 80.0–100.0)
Monocytes Absolute: 0.3 10*3/uL (ref 0.1–1.0)
Monocytes Relative: 7 %
Neutro Abs: 3 10*3/uL (ref 1.7–7.7)
Neutrophils Relative %: 65 %
Platelet Count: 227 10*3/uL (ref 150–400)
RBC: 3.47 MIL/uL — ABNORMAL LOW (ref 3.87–5.11)
RDW: 27.1 % — ABNORMAL HIGH (ref 11.5–15.5)
WBC Count: 4.7 10*3/uL (ref 4.0–10.5)
nRBC: 0 % (ref 0.0–0.2)

## 2022-12-14 MED ORDER — SODIUM CHLORIDE 0.9 % IV SOLN: INTRAVENOUS | Status: DC

## 2022-12-14 MED ORDER — SODIUM CHLORIDE 0.9 % IV SOLN
510.0000 mg | Freq: Once | INTRAVENOUS | Status: AC
  Administered 2022-12-14: 510 mg via INTRAVENOUS
  Filled 2022-12-14: qty 510

## 2022-12-14 NOTE — Patient Instructions (Signed)
 Ferumoxytol Injection What is this medication? FERUMOXYTOL (FER ue MOX i tol) treats low levels of iron in your body (iron deficiency anemia). Iron is a mineral that plays an important role in making red blood cells, which carry oxygen from your lungs to the rest of your body. This medicine may be used for other purposes; ask your health care provider or pharmacist if you have questions. COMMON BRAND NAME(S): Feraheme What should I tell my care team before I take this medication? They need to know if you have any of these conditions: Anemia not caused by low iron levels High levels of iron in the blood Magnetic resonance imaging (MRI) test scheduled An unusual or allergic reaction to iron, other medications, foods, dyes, or preservatives Pregnant or trying to get pregnant Breastfeeding How should I use this medication? This medication is injected into a vein. It is given by your care team in a hospital or clinic setting. Talk to your care team the use of this medication in children. Special care may be needed. Overdosage: If you think you have taken too much of this medicine contact a poison control center or emergency room at once. NOTE: This medicine is only for you. Do not share this medicine with others. What if I miss a dose? It is important not to miss your dose. Call your care team if you are unable to keep an appointment. What may interact with this medication? Other iron products This list may not describe all possible interactions. Give your health care provider a list of all the medicines, herbs, non-prescription drugs, or dietary supplements you use. Also tell them if you smoke, drink alcohol, or use illegal drugs. Some items may interact with your medicine. What should I watch for while using this medication? Visit your care team regularly. Tell your care team if your symptoms do not start to get better or if they get worse. You may need blood work done while you are taking this  medication. You may need to follow a special diet. Talk to your care team. Foods that contain iron include: whole grains/cereals, dried fruits, beans, or peas, leafy green vegetables, and organ meats (liver, kidney). What side effects may I notice from receiving this medication? Side effects that you should report to your care team as soon as possible: Allergic reactions--skin rash, itching, hives, swelling of the face, lips, tongue, or throat Low blood pressure--dizziness, feeling faint or lightheaded, blurry vision Shortness of breath Side effects that usually do not require medical attention (report to your care team if they continue or are bothersome): Flushing Headache Joint pain Muscle pain Nausea Pain, redness, or irritation at injection site This list may not describe all possible side effects. Call your doctor for medical advice about side effects. You may report side effects to FDA at 1-800-FDA-1088. Where should I keep my medication? This medication is given in a hospital or clinic. It will not be stored at home. NOTE: This sheet is a summary. It may not cover all possible information. If you have questions about this medicine, talk to your doctor, pharmacist, or health care provider.  2024 Elsevier/Gold Standard (2022-05-31 00:00:00)

## 2022-12-16 ENCOUNTER — Other Ambulatory Visit: Payer: Self-pay

## 2022-12-16 ENCOUNTER — Telehealth: Payer: Self-pay | Admitting: *Deleted

## 2022-12-16 DIAGNOSIS — D649 Anemia, unspecified: Secondary | ICD-10-CM

## 2022-12-16 NOTE — Telephone Encounter (Signed)
Notified of message below. States she is bleeding heavily. She sees her cardiologist on 12/17. She will call us if she starts feeling symptomatic before next labs.

## 2022-12-16 NOTE — Telephone Encounter (Signed)
-----   Message from Melven Sartorius sent at 12/16/2022 10:38 AM EST ----- Phil Dopp she just had 2nd dose of iv iron. Since hgb was low before 2nd dose. Will have her repeat labs I ordered (b12, ferritin and cbc, folate) in 1 week and will order additional iv iron if indicated. MCV was better so may not be iron therefore checking b12 and folate as well. Thanks.

## 2022-12-18 ENCOUNTER — Encounter: Payer: Self-pay | Admitting: Obstetrics and Gynecology

## 2022-12-18 ENCOUNTER — Telehealth: Payer: Self-pay | Admitting: Obstetrics and Gynecology

## 2022-12-18 NOTE — Telephone Encounter (Signed)
GYN Note 12/6 d/w pt re: bleeding. Recommend increasing aygestin to bid

## 2022-12-18 NOTE — Telephone Encounter (Signed)
error 

## 2022-12-23 ENCOUNTER — Inpatient Hospital Stay: Payer: Medicaid Other

## 2022-12-23 ENCOUNTER — Other Ambulatory Visit: Payer: Self-pay

## 2022-12-23 DIAGNOSIS — D509 Iron deficiency anemia, unspecified: Secondary | ICD-10-CM | POA: Diagnosis not present

## 2022-12-23 DIAGNOSIS — D649 Anemia, unspecified: Secondary | ICD-10-CM

## 2022-12-23 LAB — FOLATE: Folate: 28 ng/mL (ref >5.9)

## 2022-12-23 LAB — CBC WITH DIFFERENTIAL (CANCER CENTER ONLY)
Abs Immature Granulocytes: 0.02 10*3/uL (ref 0.00–0.07)
Basophils Absolute: 0 10*3/uL (ref 0.0–0.1)
Basophils Relative: 1 %
Eosinophils Absolute: 0.2 10*3/uL (ref 0.0–0.5)
Eosinophils Relative: 4 %
HCT: 34.2 % — ABNORMAL LOW (ref 36.0–46.0)
Hemoglobin: 10.8 g/dL — ABNORMAL LOW (ref 12.0–15.0)
Immature Granulocytes: 0 %
Lymphocytes Relative: 32 %
Lymphs Abs: 1.7 10*3/uL (ref 0.7–4.0)
MCH: 29.5 pg (ref 26.0–34.0)
MCHC: 31.6 g/dL (ref 30.0–36.0)
MCV: 93.4 fL (ref 80.0–100.0)
Monocytes Absolute: 0.4 10*3/uL (ref 0.1–1.0)
Monocytes Relative: 7 %
Neutro Abs: 3.1 10*3/uL (ref 1.7–7.7)
Neutrophils Relative %: 56 %
Platelet Count: 246 10*3/uL (ref 150–400)
RBC: 3.66 MIL/uL — ABNORMAL LOW (ref 3.87–5.11)
RDW: 26.8 % — ABNORMAL HIGH (ref 11.5–15.5)
WBC Count: 5.4 10*3/uL (ref 4.0–10.5)
nRBC: 0 % (ref 0.0–0.2)

## 2022-12-23 LAB — VITAMIN B12: Vitamin B-12: 349 pg/mL (ref 180–914)

## 2022-12-24 ENCOUNTER — Ambulatory Visit: Payer: Medicaid Other | Attending: Cardiology | Admitting: Cardiology

## 2022-12-24 ENCOUNTER — Telehealth: Payer: Self-pay

## 2022-12-24 ENCOUNTER — Other Ambulatory Visit: Payer: Self-pay

## 2022-12-24 ENCOUNTER — Encounter: Payer: Self-pay | Admitting: Cardiology

## 2022-12-24 ENCOUNTER — Encounter: Payer: Self-pay | Admitting: Obstetrics and Gynecology

## 2022-12-24 VITALS — BP 124/72 | HR 80 | Ht 66.0 in | Wt 214.2 lb

## 2022-12-24 DIAGNOSIS — I272 Pulmonary hypertension, unspecified: Secondary | ICD-10-CM

## 2022-12-24 DIAGNOSIS — Z8759 Personal history of other complications of pregnancy, childbirth and the puerperium: Secondary | ICD-10-CM

## 2022-12-24 DIAGNOSIS — I340 Nonrheumatic mitral (valve) insufficiency: Secondary | ICD-10-CM

## 2022-12-24 DIAGNOSIS — D500 Iron deficiency anemia secondary to blood loss (chronic): Secondary | ICD-10-CM

## 2022-12-24 DIAGNOSIS — Z8632 Personal history of gestational diabetes: Secondary | ICD-10-CM

## 2022-12-24 DIAGNOSIS — D649 Anemia, unspecified: Secondary | ICD-10-CM | POA: Diagnosis not present

## 2022-12-24 DIAGNOSIS — E669 Obesity, unspecified: Secondary | ICD-10-CM | POA: Diagnosis not present

## 2022-12-24 LAB — FERRITIN: Ferritin: 82 ng/mL (ref 11–307)

## 2022-12-24 MED ORDER — POTASSIUM CHLORIDE CRYS ER 10 MEQ PO TBCR: 10.0000 meq | EXTENDED_RELEASE_TABLET | Freq: Two times a day (BID) | ORAL | 11 refills | Status: DC

## 2022-12-24 MED ORDER — FUROSEMIDE 20 MG PO TABS: 20.0000 mg | ORAL_TABLET | ORAL | 11 refills | Status: DC

## 2022-12-24 NOTE — Patient Instructions (Signed)
Medication Instructions:  Change Lasix dose to 20 mg by mouth twice per week on Tuesday and Fridays. KCL (Potassium Cloride) 10 meq by mouth twice per day. [facility].  *If you need a refill on your cardiac medications before your next appointment, please call your pharmacy*     Follow-Up: At [facility], you and your health needs are our priority.  As part of our continuing mission to provide you with exceptional heart care, we have created designated Provider Care Teams.  These Care Teams include your primary Cardiologist (physician) and Advanced Practice Providers (APPs -  Physician Assistants and Nurse Practitioners) who all work together to provide you with the care you need, when you need it.  Your next appointment:   12 week(s)  Provider:   Thomasene Ripple, DO

## 2022-12-24 NOTE — Progress Notes (Signed)
Cardiology Office Note:    Date:  12/28/2022   ID:  Darlene Hayes, DOB 1985/05/18, MRN [id number]  PCP:  Melven Sartorius, MD  Cardiologist:  Thomasene Ripple, DO  Electrophysiologist:  None   Referring MD: Merlene Laughter, DO   " I might be getting an hysterectomy"   History of Present Illness:    Darlene Hayes is a 37 y.o. female with a hx of hx of recently diagnosed severe anemia, status hospitalization - hg 2.6 on 11/06/2022, it appears her anemia in 07/2019 was 6.9- this is suspect to be from fibroids, moderate to severe mitral regurgitation, mildly elevated pulmonary hypertension, gestational diabetes and hypertension in pregnancy.  She has had shortness of breath and fatigue. These symptoms have been present for approximately seven to eight months. The patient initially attributed these symptoms to smoking, but she has since worsened to the point of causing fainting and extreme fatigue. The patient reports hearing her heartbeat and feeling it, which prompted her to seek medical attention.  In the hospital she was noted to have severe anemia suspected to be from fibriods. She was transfused. She has been bleeding non-stop since November 22nd, which is likely contributing to her anemia.  She tells me that there are plans for fibroids removal.   Past Medical History:  Diagnosis Date   Abnormal Pap smear    abn paps since 2000. no additional testing done.    Chlamydia 07/2010   treated. hasn't been with that partner since.    Gonorrhea    treated in July 2012. hasn't been with that partner since.    Postpartum depression 10/19/2015   Vaginal Pap smear, abnormal     Past Surgical History:  Procedure Laterality Date   DILATION AND CURETTAGE OF UTERUS  12/2016   EAB done in [place]   ENDOMETRIAL BIOPSY  11/16/2022    Current Medications: Current Meds  Medication Sig   norethindrone (AYGESTIN) 5 MG tablet Take 1 tablet (5 mg total) by mouth daily.   potassium  chloride (KLOR-CON M) 10 MEQ tablet Take 1 tablet (10 mEq total) by mouth 2 (two) times daily.   [DISCONTINUED] furosemide (LASIX) 20 MG tablet Take 20 mg by mouth daily.     Allergies:   Patient has no known allergies.   Social History   Socioeconomic History   Marital status: Single    Spouse name: Not on file   Number of children: Not on file   Years of education: Not on file   Highest education level: Not on file  Occupational History   Not on file  Tobacco Use   Smoking status: Former    Current packs/day: 0.00    Types: Cigarettes    Quit date: 01/01/2015    Years since quitting: 7.9   Smokeless tobacco: Never  Substance and Sexual Activity   Alcohol use: No   Drug use: No   Sexual activity: Not Currently    Birth control/protection: None  Other Topics Concern   Not on file  Social History Narrative   Not on file   Social Drivers of Health   Financial Resource Strain: Not on file  Food Insecurity: Food Insecurity Present (11/13/2022)   Hunger Vital Sign    Worried About Running Out of Food in the Last Year: Sometimes true    Ran Out of Food in the Last Year: Sometimes true  Transportation Needs: No Transportation Needs (11/13/2022)   PRAPARE - Transportation    Lack of  Transportation (Medical): No    Lack of Transportation (Non-Medical): No  Physical Activity: Not on file  Stress: Not on file  Social Connections: Not on file     Family History: The patient's family history includes Diabetes in her maternal grandmother; Hypertension in her mother and sister; Miscarriages / [place] in her sister; Stroke in her maternal grandmother.  ROS:   Review of Systems  Constitution: Negative for decreased appetite, fever and weight gain.  HENT: Negative for congestion, ear discharge, hoarse voice and sore throat.   Eyes: Negative for discharge, redness, vision loss in right eye and visual halos.  Cardiovascular: Negative for chest pain, dyspnea on exertion, leg  swelling, orthopnea and palpitations.  Respiratory: Negative for cough, hemoptysis, shortness of breath and snoring.   Endocrine: Negative for heat intolerance and polyphagia.  Hematologic/Lymphatic: Negative for bleeding problem. Does not bruise/bleed easily.  Skin: Negative for flushing, nail changes, rash and suspicious lesions.  Musculoskeletal: Negative for arthritis, joint pain, muscle cramps, myalgias, neck pain and stiffness.  Gastrointestinal: Negative for abdominal pain, bowel incontinence, diarrhea and excessive appetite.  Genitourinary: Negative for decreased libido, genital sores and incomplete emptying.  Neurological: Negative for brief paralysis, focal weakness, headaches and loss of balance.  Psychiatric/Behavioral: Negative for altered mental status, depression and suicidal ideas.  Allergic/Immunologic: Negative for HIV exposure and persistent infections.    EKGs/Labs/Other Studies Reviewed:    The following studies were reviewed today:   EKG:  The ekg ordered today demonstrates   Recent Labs: 11/08/2022: Magnesium 2.2 11/13/2022: ALT 14; BUN 10; Creatinine, Ser 0.64; Potassium 4.4; Sodium 139; TSH 2.160 12/23/2022: Hemoglobin 10.8; Platelet Count 246  Recent Lipid Panel    Component Value Date/Time   CHOL 154 07/02/2019 1056   TRIG 38 07/02/2019 1056   HDL 55 07/02/2019 1056   CHOLHDL 2.8 07/02/2019 1056   LDLCALC 90 07/02/2019 1056    Physical Exam:    VS:  BP 124/72 (BP Location: Right Arm, Patient Position: Sitting, Cuff Size: Normal)   Pulse 80   Ht 5\' 6"  (1.676 m)   Wt 214 lb 3.2 oz (97.2 kg)   SpO2 99%   BMI 34.57 kg/m     Wt Readings from Last 3 Encounters:  12/24/22 214 lb 3.2 oz (97.2 kg)  12/06/22 210 lb (95.3 kg)  11/28/22 203 lb 12.8 oz (92.4 kg)     GEN: Well nourished, well developed in no acute distress HEENT: Normal NECK: No JVD; No carotid bruits LYMPHATICS: No lymphadenopathy CARDIAC: S1S2 noted,RRR, no murmurs, rubs,  gallops RESPIRATORY:  Clear to auscultation without rales, wheezing or rhonchi  ABDOMEN: Soft, non-tender, non-distended, +bowel sounds, no guarding. EXTREMITIES: No edema, No cyanosis, no clubbing MUSCULOSKELETAL:  No deformity  SKIN: Warm and dry NEUROLOGIC:  Alert and oriented x 3, non-focal PSYCHIATRIC:  Normal affect, good insight  ASSESSMENT:    1. Moderate to severe mitral regurgitation   2. Chronic blood loss anemia   3. Symptomatic anemia   4. Obesity (BMI 30-39.9)   5. Pulmonary hypertension, unspecified (HCC)   6. History of gestational diabetes   7. History of pregnancy induced hypertension    PLAN:    Moderate to severe mitral regurgitation  - appears to be primary mitral valve disease with restrictive motion of the posterior mitral valve and suspicion of rheumatic disease as well. Severe left atrial size. Suspicion that this chronic.  High output heart failure also contributing to her symptoms of shortness of breath. Discussed the need for further  workup post resolution of anemia. -Start Lasix twice a week (Tuesdays and Fridays) to manage fluid balance. -Plan for further cardiac workup post resolution of anemia.  Anemia - Severe anemia with hemoglobin of 2. Discussed the risks of severe anemia and the need for resolution prior to any further cardiac workup or intervention. -Plan for resolution of anemia through hysterectomy. -Check hemoglobin levels regularly until resolution of anemia.  Uterine Fibroids Severe anemia secondary to uterine fibroids. Discussed the risks/benefits of hysterectomy (loss of fertility / resolution of anemia and bleeding). -Plan for hysterectomy as recommended by gynecologist. -Contact gynecologist to discuss plan and timing of surgery.  The patient does not have any unstable cardiac conditions.  Upon evaluation today, she can achieve 4 METs or greater without anginal symptoms.  According to [facility] and AHA guidelines, she requires no further  cardiac workup prior to her noncardiac surgery and should be at acceptable risk.  Our service is available as necessary in the perioperative period.  Follow-up in 4 weeks post hysterectomy to reassess anemia and plan further cardiac workup.   The patient is in agreement with the above plan. The patient left the office in stable condition.  The patient will follow up in   Medication Adjustments/Labs and Tests Ordered: Current medicines are reviewed at length with the patient today.  Concerns regarding medicines are outlined above.  No orders of the defined types were placed in this encounter.  Meds ordered this encounter  Medications   DISCONTD: furosemide (LASIX) 20 MG tablet    Sig: Take 1 tablet (20 mg total) by mouth 2 (two) times a week. On Tuesday and Friday.    Dispense:  8 tablet    Refill:  11   potassium chloride (KLOR-CON M) 10 MEQ tablet    Sig: Take 1 tablet (10 mEq total) by mouth 2 (two) times daily.    Dispense:  60 tablet    Refill:  11   DISCONTD: furosemide (LASIX) 20 MG tablet    Sig: Take 1 tablet (20 mg total) by mouth 2 (two) times a week. On Tuesday and Friday.    Dispense:  8 tablet    Refill:  11   DISCONTD: furosemide (LASIX) 20 MG tablet    Sig: Take 1 tablet (20 mg total) by mouth 2 (two) times a week. On Tuesday and Friday.    Dispense:  8 tablet    Refill:  11   furosemide (LASIX) 20 MG tablet    Sig: Take 1 tablet (20 mg total) by mouth 2 (two) times a week. On Tuesday and Friday.    Dispense:  8 tablet    Refill:  11    Patient Instructions  Medication Instructions:  Change Lasix dose to 20 mg by mouth twice per week on Tuesday and Fridays. KCL (Potassium Cloride) 10 meq by mouth twice per day. [facility]  *If you need a refill on your cardiac medications before your next appointment, please call your pharmacy*     Follow-Up: At [facility], you and your health needs are our priority.  As part of our  continuing mission to provide you with exceptional heart care, we have created designated Provider Care Teams.  These Care Teams include your primary Cardiologist (physician) and Advanced Practice Providers (APPs -  Physician Assistants and Nurse Practitioners) who all work together to provide you with the care you need, when you need it.  Your next appointment:   12 week(s)  Provider:   Thomasene Ripple, DO       Adopting a Healthy Lifestyle.  Know what a healthy weight is for you (roughly BMI <25) and aim to maintain this   Aim for 7+ servings of fruits and vegetables daily   65-80+ fluid ounces of water or unsweet tea for healthy kidneys   Limit to max 1 drink of alcohol per day; avoid smoking/tobacco   Limit animal fats in diet for cholesterol and heart health - choose grass fed whenever available   Avoid highly processed foods, and foods high in saturated/trans fats   Aim for low stress - take time to unwind and care for your mental health   Aim for 150 min of moderate intensity exercise weekly for heart health, and weights twice weekly for bone health   Aim for 7-9 hours of sleep daily   When it comes to diets, agreement about the perfect plan isnt easy to find, even among the experts. Experts at the [facility] [employer] developed an idea known as the Healthy Eating Plate. Just imagine a plate divided into logical, healthy portions.   The emphasis is on diet quality:   Load up on vegetables and fruits - one-half of your plate: Aim for color and variety, and remember that potatoes dont count.   Go for whole grains - one-quarter of your plate: Whole wheat, barley, wheat berries, quinoa, oats, brown rice, and foods made with them. If you want pasta, go with whole wheat pasta.   Protein power - one-quarter of your plate: Fish, chicken, beans, and nuts are all healthy, versatile protein sources. Limit red meat.   The diet, however, does go beyond the plate, offering a  few other suggestions.   Use healthy plant oils, such as olive, canola, soy, corn, sunflower and peanut. Check the labels, and avoid partially hydrogenated oil, which have unhealthy trans fats.   If youre thirsty, drink water. Coffee and tea are good in moderation, but skip sugary drinks and limit milk and dairy products to one or two daily servings.   The type of carbohydrate in the diet is more important than the amount. Some sources of carbohydrates, such as vegetables, fruits, whole grains, and beans-are healthier than others.   Finally, stay active  Signed, Thomasene Ripple, DO  12/28/2022 8:59 PM    [facility]

## 2022-12-24 NOTE — Telephone Encounter (Signed)
[facility] and [facility].  Called and spoke with the pharmacist to give verbal order for Lasix (Furosemide) 20 mg by mouth two times per week on Tuesday and Friday.

## 2022-12-25 ENCOUNTER — Telehealth: Payer: Self-pay

## 2022-12-25 ENCOUNTER — Telehealth: Payer: Self-pay | Admitting: Family Medicine

## 2022-12-25 NOTE — Telephone Encounter (Signed)
Patient called wanting to speak with Dr.Pickens or one of his nurses about a conversation they were having via MyChart. Vergie Living messaged the patient on 12/17 but never finished the conversation. She has f/u questions she would like to ask. I confirmed her telephone number and told her a nurse will give her a call back.

## 2022-12-25 NOTE — Telephone Encounter (Signed)
Patient is aware of rescheduled appointment times/dates 

## 2022-12-25 NOTE — Telephone Encounter (Signed)
Called patient at number listed in chart ad verified using full name and DOB.   Patient inquired about difference between laparoscopic procedure vs. Non-laparoscopic hysterectomy that Dr. Vergie Living had mentioned in previous MyChart message conversation with patient on 12/24/22. Explained how laparoscopic is more minimally invasive compared to the alternative but also clarified that Dr. Vergie Living was stating that even if laparoscopic is attempted, there is always a change that they may have to go in with a c-section incision method (as he mentioned in the message).   Patient wanted RN to pass along to Dr. Vergie Living specifically that she is willing to do whatever procedure is needed to remove her fibroids as soon as possible. Ensured patient that I would relay message to provider.   Maureen Ralphs RN on 12/25/22 at [phone]

## 2022-12-30 ENCOUNTER — Other Ambulatory Visit: Payer: Self-pay | Admitting: Obstetrics and Gynecology

## 2022-12-30 NOTE — BH Specialist Note (Deleted)
Integrated Behavioral Health via Telemedicine Visit  12/30/2022 Darlene Hayes [id number]  Number of Integrated Behavioral Health Clinician visits: 1- Initial Visit  Session Start time: 0918   Session End time: 0935  Total time in minutes: 17   Referring Provider:  Bing, MD Patient/Family location: Home*** [facility] Provider location: Center for [facility]  All persons participating in visit: Patient Darlene Hayes and [facility] Darlene Hayes ***  Types of Service: {CHL AMB TYPE OF SERVICE:[phone]}  I connected with Darlene Hayes and/or Darlene Hayes's {family members:20773} via  Telephone or Video Enabled Telemedicine Application  (Video is Caregility application) and verified that I am speaking with the correct person using two identifiers. Discussed confidentiality: Yes   I discussed the limitations of telemedicine and the availability of in person appointments.  Discussed there is a possibility of technology failure and discussed alternative modes of communication if that failure occurs.  I discussed that engaging in this telemedicine visit, they consent to the provision of behavioral healthcare and the services will be billed under their insurance.  Patient and/or legal guardian expressed understanding and consented to Telemedicine visit: Yes   Presenting Concerns: Patient and/or family reports the following symptoms/concerns: *** Duration of problem: ***; Severity of problem: {Mild/Moderate/Severe:20260}  Patient and/or Family's Strengths/Protective Factors: {CHL AMB BH PROTECTIVE FACTORS:2092475392}  Goals Addressed: Patient will:  Reduce symptoms of: {IBH Symptoms:21014056}   Increase knowledge and/or ability of: {IBH Patient Tools:21014057}   Demonstrate ability to: {IBH Goals:21014053}  Progress towards Goals: {CHL AMB BH PROGRESS TOWARDS [phone]}  Interventions: Interventions utilized:   {IBH Interventions:21014054} Standardized Assessments completed: {IBH Screening Tools:21014051}  Patient and/or Family Response: Patient agrees with treatment plan.   Assessment: Patient currently experiencing ***.   Patient may benefit from continued therapeutic intervention *** .  Plan: Follow up with behavioral health clinician on : *** Behavioral recommendations:  -*** -*** Referral(s): {IBH Referrals:21014055}  I discussed the assessment and treatment plan with the patient and/or parent/guardian. They were provided an opportunity to ask questions and all were answered. They agreed with the plan and demonstrated an understanding of the instructions.   They were advised to call back or seek an in-person evaluation if the symptoms worsen or if the condition fails to improve as anticipated.  Valetta Close Darlene Reddix, LCSW     11/13/2022    3:51 PM 07/14/2019   10:30 AM 07/02/2019   10:23 AM 10/19/2015    4:28 PM  Depression screen PHQ 2/9  Decreased Interest 2 0 0 2  Down, Depressed, Hopeless 1 0 0 3  PHQ - 2 Score 3 0 0 5  Altered sleeping 3 0 1 3  Tired, decreased energy 3 1 1 2   Change in appetite 3 1 1 2   Feeling bad or failure about yourself  0 0 0 1  Trouble concentrating 0 0 0 1  Moving slowly or fidgety/restless 1 0 0 1  Suicidal thoughts 0 0 0 0  PHQ-9 Score 13 2 3 15       11/13/2022    3:51 PM 07/14/2019   10:30 AM 07/02/2019   10:23 AM 10/19/2015    4:28 PM  GAD 7 : Generalized Anxiety Score  Nervous, Anxious, on Edge 2 0 0 3  Control/stop worrying 3 0 0 3  Worry too much - different things 3 0 1 3  Trouble relaxing 2 0 0 2  Restless 0 0 0 2  Easily annoyed or irritable 1 0  0 3  Afraid - awful might happen 3 0 0 3  Total GAD 7 Score 14 0 1 19  Anxiety Difficulty   Not difficult at all

## 2023-01-02 ENCOUNTER — Telehealth: Payer: Self-pay

## 2023-01-02 NOTE — Telephone Encounter (Signed)
Called patient to discuss surgery details. Left message stating the patient is scheduled at [facility] on 01/29/23 w/ Dr. Vergie Living and patient will need to arrive by 5:30 am. Asked patient to call me to confirm surgery details. [phone].

## 2023-01-02 NOTE — Telephone Encounter (Signed)
Called pt to review need for hysterectomy statement. Plans to come into office on Monday, 01/06/23 around 4 PM to sign.

## 2023-01-06 DIAGNOSIS — Z0289 Encounter for other administrative examinations: Secondary | ICD-10-CM

## 2023-01-09 ENCOUNTER — Encounter: Payer: Self-pay | Admitting: *Deleted

## 2023-01-13 ENCOUNTER — Ambulatory Visit: Payer: Medicaid Other

## 2023-01-13 ENCOUNTER — Other Ambulatory Visit: Payer: Medicaid Other

## 2023-01-15 ENCOUNTER — Emergency Department (HOSPITAL_COMMUNITY): Payer: Medicaid Other

## 2023-01-15 ENCOUNTER — Emergency Department (HOSPITAL_COMMUNITY)
Admission: EM | Admit: 2023-01-15 | Discharge: 2023-01-15 | Discharge status: Against Medical Advice | Payer: Medicaid Other | Attending: Emergency Medicine | Admitting: Emergency Medicine

## 2023-01-15 ENCOUNTER — Other Ambulatory Visit: Payer: Self-pay

## 2023-01-15 ENCOUNTER — Encounter (HOSPITAL_COMMUNITY): Payer: Self-pay | Admitting: Emergency Medicine

## 2023-01-15 DIAGNOSIS — R079 Chest pain, unspecified: Secondary | ICD-10-CM | POA: Insufficient documentation

## 2023-01-15 DIAGNOSIS — R0602 Shortness of breath: Secondary | ICD-10-CM | POA: Insufficient documentation

## 2023-01-15 DIAGNOSIS — Z5321 Procedure and treatment not carried out due to patient leaving prior to being seen by health care provider: Secondary | ICD-10-CM | POA: Insufficient documentation

## 2023-01-15 LAB — CBC
HCT: 33.4 % — ABNORMAL LOW (ref 36.0–46.0)
Hemoglobin: 10.7 g/dL — ABNORMAL LOW (ref 12.0–15.0)
MCH: 31 pg (ref 26.0–34.0)
MCHC: 32 g/dL (ref 30.0–36.0)
MCV: 96.8 fL (ref 80.0–100.0)
Platelets: 252 10*3/uL (ref 150–400)
RBC: 3.45 MIL/uL — ABNORMAL LOW (ref 3.87–5.11)
RDW: 20.6 % — ABNORMAL HIGH (ref 11.5–15.5)
WBC: 4.7 10*3/uL (ref 4.0–10.5)
nRBC: 0 % (ref 0.0–0.2)

## 2023-01-15 LAB — TYPE AND SCREEN
ABO/RH(D): A POS
Antibody Screen: NEGATIVE

## 2023-01-15 LAB — BASIC METABOLIC PANEL
Anion gap: 9 (ref 5–15)
BUN: 11 mg/dL (ref 6–20)
CO2: 22 mmol/L (ref 22–32)
Calcium: 9.1 mg/dL (ref 8.9–10.3)
Chloride: 109 mmol/L (ref 98–111)
Creatinine, Ser: 0.79 mg/dL (ref 0.44–1.00)
GFR, Estimated: >60 mL/min (ref >60)
Glucose, Bld: 89 mg/dL (ref 70–99)
Potassium: 3.6 mmol/L (ref 3.5–5.1)
Sodium: 140 mmol/L (ref 135–145)

## 2023-01-15 LAB — TROPONIN I (HIGH SENSITIVITY): Troponin I (High Sensitivity): 12 ng/L (ref <18)

## 2023-01-15 LAB — HCG, SERUM, QUALITATIVE: Preg, Serum: NEGATIVE

## 2023-01-15 NOTE — ED Triage Notes (Signed)
 Pt endorses SOB and CP for a few months. Central CP with no radiation. Nothing makes pain worse or better.

## 2023-01-15 NOTE — ED Notes (Signed)
 Pt endorses heavy bleeding for 50 days with previous need of blood tx. Type and screen added.

## 2023-01-16 ENCOUNTER — Telehealth: Payer: Self-pay

## 2023-01-16 ENCOUNTER — Other Ambulatory Visit: Payer: Self-pay

## 2023-01-16 ENCOUNTER — Other Ambulatory Visit: Payer: Self-pay | Admitting: Obstetrics and Gynecology

## 2023-01-16 DIAGNOSIS — D500 Iron deficiency anemia secondary to blood loss (chronic): Secondary | ICD-10-CM

## 2023-01-16 DIAGNOSIS — Z01818 Encounter for other preprocedural examination: Secondary | ICD-10-CM

## 2023-01-16 NOTE — Telephone Encounter (Signed)
 TC to [facility] lab to see if Ferritin and B12 labs could be collected from pt's blood sample from yesterday's visit to the ED. This request was denied due to billing issues. Labs ordered per Dr. Fredrik request, and message sent to scheduling to set up a lab appointment as soon as possible.

## 2023-01-20 ENCOUNTER — Encounter: Payer: Self-pay | Admitting: Obstetrics and Gynecology

## 2023-01-20 ENCOUNTER — Inpatient Hospital Stay: Payer: Medicaid Other

## 2023-01-20 ENCOUNTER — Other Ambulatory Visit: Payer: Self-pay

## 2023-01-20 ENCOUNTER — Telehealth: Payer: Self-pay

## 2023-01-20 ENCOUNTER — Encounter: Payer: Self-pay | Admitting: Family Medicine

## 2023-01-20 ENCOUNTER — Ambulatory Visit: Payer: Medicaid Other | Admitting: Obstetrics and Gynecology

## 2023-01-20 VITALS — BP 119/85 | HR 111 | Wt 225.4 lb

## 2023-01-20 DIAGNOSIS — D509 Iron deficiency anemia, unspecified: Secondary | ICD-10-CM | POA: Insufficient documentation

## 2023-01-20 DIAGNOSIS — D219 Benign neoplasm of connective and other soft tissue, unspecified: Secondary | ICD-10-CM

## 2023-01-20 DIAGNOSIS — D500 Iron deficiency anemia secondary to blood loss (chronic): Secondary | ICD-10-CM

## 2023-01-20 DIAGNOSIS — Z79899 Other long term (current) drug therapy: Secondary | ICD-10-CM | POA: Insufficient documentation

## 2023-01-20 DIAGNOSIS — I340 Nonrheumatic mitral (valve) insufficiency: Secondary | ICD-10-CM

## 2023-01-20 DIAGNOSIS — N939 Abnormal uterine and vaginal bleeding, unspecified: Secondary | ICD-10-CM

## 2023-01-20 LAB — CBC WITH DIFFERENTIAL (CANCER CENTER ONLY)
Abs Immature Granulocytes: 0.01 10*3/uL (ref 0.00–0.07)
Basophils Absolute: 0 10*3/uL (ref 0.0–0.1)
Basophils Relative: 1 %
Eosinophils Absolute: 0.2 10*3/uL (ref 0.0–0.5)
Eosinophils Relative: 3 %
HCT: 27.9 % — ABNORMAL LOW (ref 36.0–46.0)
Hemoglobin: 9 g/dL — ABNORMAL LOW (ref 12.0–15.0)
Immature Granulocytes: 0 %
Lymphocytes Relative: 30 %
Lymphs Abs: 1.5 10*3/uL (ref 0.7–4.0)
MCH: 30.2 pg (ref 26.0–34.0)
MCHC: 32.3 g/dL (ref 30.0–36.0)
MCV: 93.6 fL (ref 80.0–100.0)
Monocytes Absolute: 0.3 10*3/uL (ref 0.1–1.0)
Monocytes Relative: 6 %
Neutro Abs: 3 10*3/uL (ref 1.7–7.7)
Neutrophils Relative %: 60 %
Platelet Count: 284 10*3/uL (ref 150–400)
RBC: 2.98 MIL/uL — ABNORMAL LOW (ref 3.87–5.11)
RDW: 19.2 % — ABNORMAL HIGH (ref 11.5–15.5)
WBC Count: 4.9 10*3/uL (ref 4.0–10.5)
nRBC: 0 % (ref 0.0–0.2)

## 2023-01-20 LAB — FERRITIN: Ferritin: 7 ng/mL — ABNORMAL LOW (ref 11–307)

## 2023-01-20 LAB — VITAMIN B12: Vitamin B-12: 231 pg/mL (ref 180–914)

## 2023-01-20 MED ORDER — NORETHINDRONE ACETATE 5 MG PO TABS: 5.0000 mg | ORAL_TABLET | Freq: Three times a day (TID) | ORAL | Status: DC

## 2023-01-20 NOTE — Progress Notes (Signed)
 Obstetrics & Gynecology Surgical H&P   Date of Surgery: 01/20/2023   Primary OBGYN: [facility]  Reason for Admission:  pre-op visit for 1/22 hysterectomy for fibroids, bleeding  History of Present Illness: Darlene Hayes is a 38 y.o. African-American G2P1011 (LMP: unknown.), seen for the above chief complaint. Her past medical history is significant for MV regurg, ?cardiomegaly, possible HTN, fibroids, recent Hgb of 2.6, BMI 30s, tobacco abuse   Patient presented to [facility] on 10/30 with s/s of anemia, no bleeding. Hgb 2.6 and patient subsequently transfused. U/s showed multiple fibroids, including submucosal ones with overall uterine size 13 x 7.5 x 8.5cm. She was subsequently transfused and discharged to home. GYN consulted and recommended patient start combined OCPs and follow up as an outpatient. She also received an echo for chest s/s which showed MVR; she was discharged on 11/1.   She saw me for 11/6 new patient visit and had negative pap and embx (secretory endo) and options considered by her.  Patient seen by hematology who started her on IV iron, and she was seen by cardiology and echo findings felt to likely be from her anemia. She was started on lasix  2x/week and hysterectomy also recommended by cardiology  Patient also, eventually amenable to starting outpatient progestins, and is currently on aygestin  5mg  bid but still endorses qday VB. She went to ED on 1/8 and ?LWBL but cbc stable at hgb 10.7.    Interval History: currently endorses continued AUB and clots multiple times a day.   ROS: A 12-point review of systems was performed and negative, except as stated in the above HPI.  OBGYN History: As per HPI. OB History  Gravida Para Term Preterm AB Living  2 1 1  0 1 1  SAB IAB Ectopic Multiple Live Births  0 1 0 0 1    # Outcome Date GA Lbr Len/2nd Weight Sex Type Anes PTL Lv  2 IAB 2019 [redacted]w[redacted]d         1 Term 09/03/15 [redacted]w[redacted]d 11:48  / 01:16 7 lb 14.8 oz (3.595 kg) M Vag-Spont EPI  LIV   Past Medical History: Past Medical History:  Diagnosis Date   Abnormal Pap smear    abn paps since 2000. no additional testing done.    Chlamydia 07/2010   treated. hasn't been with that partner since.    Gonorrhea    treated in July 2012. hasn't been with that partner since.    Postpartum depression 10/19/2015   Vaginal Pap smear, abnormal     Past Surgical History: Past Surgical History:  Procedure Laterality Date   DILATION AND CURETTAGE OF UTERUS  12/2016   EAB done in [place]   ENDOMETRIAL BIOPSY  11/16/2022    Family History:  Family History  Problem Relation Age of Onset   Hypertension Mother    Miscarriages / Stillbirths Sister    Hypertension Sister    Stroke Maternal Grandmother    Diabetes Maternal Grandmother     Social History:  Social History   Socioeconomic History   Marital status: Single    Spouse name: Not on file   Number of children: Not on file   Years of education: Not on file   Highest education level: Not on file  Occupational History   Not on file  Tobacco Use   Smoking status: Former    Current packs/day: 0.00    Types: Cigarettes    Quit date: 01/01/2015    Years  since quitting: 8.0   Smokeless tobacco: Never  Substance and Sexual Activity   Alcohol use: No   Drug use: No   Sexual activity: Not Currently    Birth control/protection: None  Other Topics Concern   Not on file  Social History Narrative   Not on file   Social Drivers of Health   Financial Resource Strain: Not on file  Food Insecurity: Food Insecurity Present (11/13/2022)   Hunger Vital Sign    Worried About Running Out of Food in the Last Year: Sometimes true    Ran Out of Food in the Last Year: Sometimes true  Transportation Needs: No Transportation Needs (11/13/2022)   PRAPARE - Administrator, Civil Service (Medical): No    Lack of Transportation (Non-Medical): No  Physical Activity: Not on  file  Stress: Not on file  Social Connections: Not on file  Intimate Partner Violence: Not At Risk (11/08/2022)   Humiliation, Afraid, Rape, and Kick questionnaire    Fear of Current or Ex-Partner: No    Emotionally Abused: No    Physically Abused: No    Sexually Abused: No    Allergy: No Known Allergies  Current Outpatient Medications: Current Outpatient Medications  Medication Sig Dispense Refill   furosemide  (LASIX ) 20 MG tablet Take 1 tablet (20 mg total) by mouth 2 (two) times a week. On Tuesday and Friday. 8 tablet 11   acetaminophen  (TYLENOL ) 325 MG tablet Take 2 tablets (650 mg total) by mouth every 6 (six) hours as needed for mild pain (pain score 1-3) (or Fever >/= 101). (Patient not taking: Reported on 11/13/2022) 20 tablet 0   norethindrone  (AYGESTIN ) 5 MG tablet Take 1 tablet (5 mg total) bid.     potassium chloride  (KLOR-CON  M) 10 MEQ tablet Take 1 tablet (10 mEq total) by mouth 2 (two) times daily. (Patient not taking: Reported on 01/20/2023) 60 tablet 11   No current facility-administered medications for this visit.     Physical Exam:  Current Vital Signs 24h Vital Sign Ranges  T   @FLOWSTAT (6:24)@  BP 119/85 @FLOWSTAT (5:24)@  HR (!) 111 @FLOWSTAT (8:24)@  RR   @FLOWSTAT (9:24)@  SaO2     @FLOWSTAT (10:24)@       24 Hour I/O Current Shift I/O  Time Ins Outs No intake/output data recorded. @RRIOCURSHIFT @    Body mass index is 36.39 kg/m. General appearance: Well nourished, well developed female in no acute distress.  Cardiovascular: S1, S2 normal, no murmur, rub or gallop, regular rate and rhythm Respiratory:  Clear to auscultation bilateral. Normal respiratory effort Abdomen: obese, nttp, nd, no masses Neuro/Psych:  Normal mood and affect.  Skin:  Warm and dry.  Extremities: no clubbing, cyanosis, or edema.    Laboratory: Recent Labs  Lab 01/15/23 0841  WBC 4.7  HGB 10.7*  HCT 33.4*  PLT 252   Recent Labs  Lab 01/15/23 0435  NA 140  K 3.6  CL  109  CO2 22  BUN 11  CREATININE 0.79  CALCIUM 9.1  GLUCOSE 89   No results for input(s): APTT, INR, PTT in the last 168 hours.  Invalid input(s): DRHAPTT Recent Labs  Lab 01/15/23 0834  ABORH A POS    Imaging:  1/8 CXR 2v: wnl  Narrative & Impression  CLINICAL DATA:  38 year old female with cough, chest pain, palpitations. Dysfunctional uterine bleeding. LMP 10/19/2022.   EXAM: TRANSABDOMINAL AND TRANSVAGINAL ULTRASOUND OF PELVIS   TECHNIQUE: Both transabdominal and transvaginal ultrasound examinations of the pelvis were  performed. Transabdominal technique was performed for global imaging of the pelvis including uterus, ovaries, adnexal regions, and pelvic cul-de-sac. It was necessary to proceed with endovaginal exam following the transabdominal exam to visualize the ovaries.   COMPARISON:  Ob ultrasound 10/24/2016.   FINDINGS: Uterus   Measurements: 12.9 x 7.4 x 9.4 cm = volume: 468 mL. Multiple uterine fibroids with hypervascularity (image 97) ranging from 3.0 to 7.3 cm diameter (images 64 and 65. These generally appear to be intramural, and there is distortion of the endometrial contour (image 22) suggesting submucosal location of some lesions.   Endometrium   Thickness: 5 mm (image 122). Partially obscured by fibroid disease. No discrete endometrial lesion.   Right ovary   Only identified transabdominally. Measurements: 2.8 x 1.9 x 2.9 cm = volume: 8 mL. Normal appearance/no adnexal mass.   Left ovary   Only identified transabdominally. Measurements: 3.1 x 2.5 x 2.5 = volume: 10 mL. Normal appearance. Normal follicle (image 53 no follow-up imaging recommended), no adnexal mass.   Other findings   No free fluid.   IMPRESSION: 1. Enlarged fibroid uterus. 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 2. Endometrium mildly distorted by fibroids, otherwise negative. 3. Normal ovaries.      Electronically Signed   By: VEAR Hurst M.D.   On: 11/06/2022 16:39   Narrative  This result has an attachment that is not available. LV normal size. LVEF normal. LVEF 60-65%. RV normal size and function. Mild to moderate mitral valvular regurgitation. RVSP normal. Aortic root normal size. No pericardial effusion.  No previous study is available for comparison. Results d/w Dr. Dyanna. Left Ventricle Normal left ventricular systolic function with a visually estimated LVEF of 60 - 65%. Left ventricle size is normal. LV size assessed without contrast. Normal wall thickness. Normal wall motion.  Right Ventricle Right ventricle size is normal. Normal right ventricular systolic function.  Left Atrium Left atrium is mildly dilated. No PFO present as assessed by color flow Doppler. No thrombus in the left atrial appendage. Normal sized appendage. Normal appendage flow velocity. Normal sized pulmonary veins. Normal flow patterns in the pulmonary veins.  Right Atrium Right atrium size is normal.  Mitral Valve Mitral valve structure is normal. Mild to moderate mitral valvular regurgitation. No mitral valve stenosis.  Tricuspid Valve Tricuspid valve structure is normal. Trace tricuspid valvular regurgitation. No tricuspid valve stenosis.  Aortic Valve Aortic valve tricuspid. Aortic valve opens well. No aortic valvular regurgitation. No aortic valve stenosis.  Pulmonic Valve Pulmonic valve structure is normal. No pulmonic valve regurgitation. No pulmonic valve stenosis.  Pericardium No pericardial effusion.  Aorta Normal sized ascending aorta.  Right Heart Estimated mean RA pressure based on RA size. Estimated peak pulmonary artery pressure is normal. Estimated Peak PA pressure is 21 mmHg assuming right atrial pressure of 3 mmHg.  Prior Study No previous echocardiogram for comparison.  Procedure A two-dimensional transesophageal echocardiogram was performed. 06687 (2D, full  spectral and color flow Doppler) A three-dimensional rendering with interpretation and reporting of computed tomography, magnetic resonance imaging, ultrasound, or other tomographic modality with image postprocessing under concurrent supervision was performed. 23623 Patient status: outpatient. The patient's vital signs, including blood pressure, heart rate, pulse oximetry and cardiac rhythm were monitored throughout the procedure and remained stable and the patient tolerated the procedure well without evidence of esophageal trauma. Sedation: administered by anesthesiologist. See patient chart for sedation info. All views obtained. Patient experienced no complications during the  exam. The probe was passed easily.  HT: 167.6 cm WT: 92.10 kg BSA: 2.01 m2 BP: 131 / 74 mmHg  Assessment: Ms. Bilello is a 38 y.o. G2P1011 (No LMP recorded.) here for pre op check. Pt stable  Plan: D/w her re: surgery and plan is to try for TLH/BS/cysto but chance may need to convert to open procedure.  Recommend increasing aygestin  5mg  from bid to tid. I reminded her of heme appts at 1230 and 1pm today. ED precautions given.   Bebe Izell Overcast MD Attending Center for [facility])

## 2023-01-20 NOTE — Telephone Encounter (Signed)
 Patient came by office today after having labs drawn. She is having surgery on 1/22 and says that if she needs IV iron, she would like to have it this Wednesday (1/15) because she is off work on Wednesdays. As of 1620, Ferritin hasn't resulted yet. Staff message with this information sent (high priority) to Dr. Tina. TC to pt to inform that results are still pending and that she will hear something from Dr. Fredrik office after he reviews results of today's labs.

## 2023-01-20 NOTE — Progress Notes (Deleted)
 Bid to tid

## 2023-01-20 NOTE — Progress Notes (Signed)
 Feraheme two doses ordered to be completed at [facility] location asap. Please let patient know.

## 2023-01-20 NOTE — H&P (Signed)
  Narrative  This result has an attachment that is not available. LV normal size. LVEF normal. LVEF 60-65%. RV normal size and function. Mild to moderate mitral valvular regurgitation. RVSP normal. Aortic root normal size. No pericardial effusion.  No previous study is available for comparison. Results d/w Dr. Dyanna. Left Ventricle Normal left ventricular systolic function with a visually estimated LVEF of 60 - 65%. Left ventricle size is normal. LV size assessed without contrast. Normal wall thickness. Normal wall motion.  Right Ventricle Right ventricle size is normal. Normal right ventricular systolic function.  Left Atrium Left atrium is mildly dilated. No PFO present as assessed by color flow Doppler. No thrombus in the left atrial appendage. Normal sized appendage. Normal appendage flow velocity. Normal sized pulmonary veins. Normal flow patterns in the pulmonary veins.  Right Atrium Right atrium size is normal.  Mitral Valve Mitral valve structure is normal. Mild to moderate mitral valvular regurgitation. No mitral valve stenosis.  Tricuspid Valve Tricuspid valve structure is normal. Trace tricuspid valvular regurgitation. No tricuspid valve stenosis.  Aortic Valve Aortic valve tricuspid. Aortic valve opens well. No aortic valvular regurgitation. No aortic valve stenosis.  Pulmonic Valve Pulmonic valve structure is normal. No pulmonic valve regurgitation. No pulmonic valve stenosis.  Pericardium No pericardial effusion.  Aorta Normal sized ascending aorta.  Right Heart Estimated mean RA pressure based on RA size. Estimated peak pulmonary artery pressure is normal. Estimated Peak PA pressure is 21 mmHg assuming right atrial pressure of 3 mmHg.  Prior Study No previous echocardiogram for comparison.  Procedure A two-dimensional transesophageal echocardiogram was performed. 06687 (2D, full spectral and color flow Doppler) A three-dimensional rendering with  interpretation and reporting of computed tomography, magnetic resonance imaging, ultrasound, or other tomographic modality with image postprocessing under concurrent supervision was performed. 23623 Patient status: outpatient. The patient's vital signs, including blood pressure, heart rate, pulse oximetry and cardiac rhythm were monitored throughout the procedure and remained stable and the patient tolerated the procedure well without evidence of esophageal trauma. Sedation: administered by anesthesiologist. See patient chart for sedation info. All views obtained. Patient experienced no complications during the exam. The probe was passed easily.  HT: 167.6 cm WT: 92.10 kg BSA: 2.01 m2 BP: 131 / 74 mmHg

## 2023-01-21 ENCOUNTER — Ambulatory Visit: Payer: Medicaid Other

## 2023-01-21 ENCOUNTER — Encounter: Payer: Self-pay | Admitting: Obstetrics and Gynecology

## 2023-01-21 ENCOUNTER — Telehealth: Payer: Self-pay | Admitting: Pharmacy Technician

## 2023-01-21 ENCOUNTER — Other Ambulatory Visit: Payer: Self-pay | Admitting: Obstetrics and Gynecology

## 2023-01-21 VITALS — BP 115/78 | HR 106 | Temp 98.3°F | Resp 14 | Ht 66.0 in | Wt 225.6 lb

## 2023-01-21 DIAGNOSIS — D500 Iron deficiency anemia secondary to blood loss (chronic): Secondary | ICD-10-CM

## 2023-01-21 DIAGNOSIS — N938 Other specified abnormal uterine and vaginal bleeding: Secondary | ICD-10-CM

## 2023-01-21 MED ORDER — FERUMOXYTOL INJECTION 510 MG/17 ML
510.0000 mg | Freq: Once | INTRAVENOUS | Status: AC
  Administered 2023-01-21: 510 mg via INTRAVENOUS
  Filled 2023-01-21: qty 17

## 2023-01-21 NOTE — Progress Notes (Signed)
 Diagnosis: Iron Deficiency Anemia  Provider:  Praveen Mannam MD  Procedure: IV Infusion  IV Type: Peripheral, IV Location: R Forearm  Feraheme  (Ferumoxytol ), Dose: 510 mg  Patient refused pre-medications. Nurse educated patient and stressed the importance of taking pre-medications as a precaution in the event of a medication reaction. Patient verbalized understanding.   Infusion Start Time: 1519  Infusion Stop Time: 1536  Post Infusion IV Care: Patient declined observation and Peripheral IV Discontinued  Discharge: Condition: Stable, Destination: Home . AVS Declined  Performed by:  Rocky FORBES Sar, RN

## 2023-01-21 NOTE — Telephone Encounter (Signed)
 Dr. Charm,  Patient will be scheuled as soon as possible.  Auth Submission: NO AUTH NEEDED Site of care: Site of care: [facility] Payer: [employer] MEDICAID Medication & CPT/J Code(s) submitted: Feraheme  (ferumoxytol ) [id number] Route of submission (phone, fax, portal):  Phone # Fax # Auth type: Buy/Bill PB Units/visits requested: 2 Reference number:  Approval from: 01/20/23 to 05/20/23

## 2023-01-22 ENCOUNTER — Encounter (HOSPITAL_BASED_OUTPATIENT_CLINIC_OR_DEPARTMENT_OTHER): Payer: Self-pay | Admitting: Obstetrics and Gynecology

## 2023-01-24 ENCOUNTER — Telehealth: Payer: Self-pay | Admitting: Obstetrics and Gynecology

## 2023-01-24 ENCOUNTER — Encounter (HOSPITAL_BASED_OUTPATIENT_CLINIC_OR_DEPARTMENT_OTHER): Payer: Self-pay | Admitting: Obstetrics and Gynecology

## 2023-01-24 NOTE — Telephone Encounter (Signed)
GYN Telephone Note  An inbasket message was sent this afternoon re: [facility] wanting to have the patient switched to [facility] main but no OR time there until March. Message sent to [facility] and [place] about keeping patient at [facility]  I called the patient and she said that she doesn't have chest pain and she said that told the pre op nurse that she had chest pain previously. I told her if she has chest pain in the future to go to the ED for evaluation.    Cornelia Copa MD Attending Center for [employer] (Faculty Practice) 01/24/2023 Time: 1700

## 2023-01-24 NOTE — Progress Notes (Deleted)
Your procedure is scheduled on :  Wednesday,  01-29-2023  Report to [facility] AT  _5:30__ AM.   Call this number if you have problems the morning of surgery:  [phone]. Any questions prior to surgery call pre-op nurse ,  Shalandra Leu :  [phone]   OUR ADDRESS IS [address].  WE ARE LOCATED IN THE [facility]  PLEASE BRING YOUR INSURANCE CARD AND PHOTO ID DAY OF SURGERY.                                     REMEMBER:  Do not eat any food and do not drink any liquid after midnight night before surgery.  This includes no water ,  candy,  gum,  and  mints.     Please brush your teeth morning of surgery and rinse mouth out. _____________________________________________________________________     TAKE ONLY THESE MEDICATIONS MORNING OF SURGERY:  None                                        DO NOT WEAR JEWERLY/  METAL/  PIERCINGS (INCLUDING NO PLASTIC PIERCINGS) DO NOT WEAR LOTIONS, POWDERS, PERFUMES OR NAIL POLISH ON YOUR FINGERNAILS. TOENAIL POLISH IS OK TO WEAR. DO NOT SHAVE FOR 48 HOURS PRIOR TO DAY OF SURGERY.  CONTACTS, GLASSES, OR DENTURES MAY NOT BE WORN TO SURGERY.  REMEMBER: NO SMOKING, VAPING ,  DRUGS OR ALCOHOL FOR 24 HOURS BEFORE YOUR SURGERY.                                    [place] IS NOT RESPONSIBLE  FOR ANY BELONGINGS.                                                                    Marland Kitchen           [place] - Preparing for Surgery Before surgery, you can play an important role.  Because skin is not sterile, your skin needs to be as free of germs as possible.  You can reduce the number of germs on your skin by washing with CHG (chlorahexidine gluconate) soap before surgery.  CHG is an antiseptic cleaner which kills germs and bonds with the skin to continue killing germs even after washing. Please DO NOT use if you have an allergy to CHG or antibacterial soaps.  If your skin becomes reddened/irritated stop using  the CHG and inform your nurse when you arrive at Short Stay. Do not shave (including legs and underarms) for at least 48 hours prior to the first CHG shower.  You may shave your face/neck. Please follow these instructions carefully:  1.  Shower with CHG Soap the night before surgery and the  morning of Surgery.  2.  If you choose to wash your hair, wash your hair first as usual with your  normal  shampoo.  3.  After you shampoo, rinse your hair and body thoroughly to remove  the  shampoo.                                        4.  Use CHG as you would any other liquid soap.  You can apply chg directly  to the skin and wash , chg soap provided, night before and morning of your surgery.  5.  Apply the CHG Soap to your body ONLY FROM THE NECK DOWN.   Do not use on face/ open                           Wound or open sores. Avoid contact with eyes, ears mouth and genitals (private parts).                       Wash face,  Genitals (private parts) with your normal soap.             6.  Wash thoroughly, paying special attention to the area where your surgery  will be performed.  7.  Thoroughly rinse your body with warm water from the neck down.  8.  DO NOT shower/wash with your normal soap after using and rinsing off  the CHG Soap.             9.  Pat yourself dry with a clean towel.            10.  Wear clean pajamas.            11.  Place clean sheets on your bed the night of your first shower and do not  sleep with pets. Day of Surgery : Do not apply any lotions/ powders the morning of surgery.  Please wear clean clothes to the hospital/surgery center.  IF YOU HAVE ANY SKIN IRRITATION OR PROBLEMS WITH THE SURGICAL SOAP, PLEASE GET A BAR OF GOLD DIAL SOAP AND SHOWER THE NIGHT BEFORE YOUR SURGERY AND THE MORNING OF YOUR SURGERY. PLEASE LET THE NURSE KNOW MORNING OF YOUR SURGERY IF YOU HAD ANY PROBLEMS WITH THE SURGICAL SOAP.   YOUR SURGEON MAY HAVE REQUESTED EXTENDED RECOVERY TIME AFTER YOUR SURGERY.  IT COULD BE A  JUST A FEW HOURS  UP TO AN OVERNIGHT STAY.  YOUR SURGEON SHOULD HAVE DISCUSSED THIS WITH YOU PRIOR TO YOUR SURGERY. IN THE EVENT YOU NEED TO STAY OVERNIGHT PLEASE REFER TO THE FOLLOWING GUIDELINES. YOU MAY HAVE UP TO 4 VISITORS  MAY VISIT IN THE EXTENDED RECOVERY ROOM UNTIL 800 PM ONLY.  ONE  VISITOR AGE 57 AND OVER MAY SPEND THE NIGHT AND MUST BE IN EXTENDED RECOVERY ROOM NO LATER THAN 800 PM . YOUR DISCHARGE TIME AFTER YOU SPEND THE NIGHT IS 900 AM THE MORNING AFTER YOUR SURGERY. YOU MAY PACK A SMALL OVERNIGHT BAG WITH TOILETRIES FOR YOUR OVERNIGHT STAY IF YOU WISH.  REGARDLESS OF IF YOU STAY OVER NIGHT OR ARE DISCHARGED THE SAME DAY YOU WILL BE REQUIRED TO HAVE A RESPONSIBLE ADULT (18 YRS OLD OR OLDER) STAY WITH YOU FOR AT LEAST THE FIRST 67 HOURS WHEN HOME.  YOUR PRESCRIPTION MEDICATIONS WILL BE PROVIDED DURING [facility] STAY.  ________________________________________________________________________

## 2023-01-24 NOTE — Progress Notes (Signed)
Your procedure is scheduled on :  Wednesday,  01-29-2023  Report to [facility] AT  _5:30__ AM.   Call this number if you have problems the morning of surgery:  [phone]. Any questions prior to surgery call pre-op nurse ,  Shalandra Leu :  [phone]   OUR ADDRESS IS [address].  WE ARE LOCATED IN [facility]  PLEASE BRING YOUR INSURANCE CARD AND PHOTO ID DAY OF SURGERY.                                     REMEMBER:  Do not eat any food and do not drink any liquid after midnight night before surgery.  This includes no water ,  candy,  gum,  and  mints.     Please brush your teeth morning of surgery and rinse mouth out. _____________________________________________________________________     TAKE ONLY THESE MEDICATIONS MORNING OF SURGERY:  None                                        DO NOT WEAR JEWERLY/  METAL/  PIERCINGS (INCLUDING NO PLASTIC PIERCINGS) DO NOT WEAR LOTIONS, POWDERS, PERFUMES OR NAIL POLISH ON YOUR FINGERNAILS. TOENAIL POLISH IS OK TO WEAR. DO NOT SHAVE FOR 48 HOURS PRIOR TO DAY OF SURGERY.  CONTACTS, GLASSES, OR DENTURES MAY NOT BE WORN TO SURGERY.  REMEMBER: NO SMOKING, VAPING ,  DRUGS OR ALCOHOL FOR 24 HOURS BEFORE YOUR SURGERY.                                    [place] IS NOT RESPONSIBLE  FOR ANY BELONGINGS.                                                                    Marland Kitchen           [place] - Preparing for Surgery Before surgery, you can play an important role.  Because skin is not sterile, your skin needs to be as free of germs as possible.  You can reduce the number of germs on your skin by washing with CHG (chlorahexidine gluconate) soap before surgery.  CHG is an antiseptic cleaner which kills germs and bonds with the skin to continue killing germs even after washing. Please DO NOT use if you have an allergy to CHG or antibacterial soaps.  If your skin becomes reddened/irritated stop using  the CHG and inform your nurse when you arrive at Short Stay. Do not shave (including legs and underarms) for at least 48 hours prior to the first CHG shower.  You may shave your face/neck. Please follow these instructions carefully:  1.  Shower with CHG Soap the night before surgery and the  morning of Surgery.  2.  If you choose to wash your hair, wash your hair first as usual with your  normal  shampoo.  3.  After you shampoo, rinse your hair and body thoroughly to remove  the  shampoo.                                        4.  Use CHG as you would any other liquid soap.  You can apply chg directly  to the skin and wash , chg soap provided, night before and morning of your surgery.  5.  Apply the CHG Soap to your body ONLY FROM THE NECK DOWN.   Do not use on face/ open                           Wound or open sores. Avoid contact with eyes, ears mouth and genitals (private parts).                       Wash face,  Genitals (private parts) with your normal soap.             6.  Wash thoroughly, paying special attention to the area where your surgery  will be performed.  7.  Thoroughly rinse your body with warm water from the neck down.  8.  DO NOT shower/wash with your normal soap after using and rinsing off  the CHG Soap.             9.  Pat yourself dry with a clean towel.            10.  Wear clean pajamas.            11.  Place clean sheets on your bed the night of your first shower and do not  sleep with pets. Day of Surgery : Do not apply any lotions/ powders the morning of surgery.  Please wear clean clothes to the hospital/surgery center.  IF YOU HAVE ANY SKIN IRRITATION OR PROBLEMS WITH THE SURGICAL SOAP, PLEASE GET A BAR OF GOLD DIAL SOAP AND SHOWER THE NIGHT BEFORE YOUR SURGERY AND THE MORNING OF YOUR SURGERY. PLEASE LET THE NURSE KNOW MORNING OF YOUR SURGERY IF YOU HAD ANY PROBLEMS WITH THE SURGICAL SOAP.   YOUR SURGEON MAY HAVE REQUESTED EXTENDED RECOVERY TIME AFTER YOUR SURGERY.  IT COULD BE A  JUST A FEW HOURS  UP TO AN OVERNIGHT STAY.  YOUR SURGEON SHOULD HAVE DISCUSSED THIS WITH YOU PRIOR TO YOUR SURGERY. IN THE EVENT YOU NEED TO STAY OVERNIGHT PLEASE REFER TO THE FOLLOWING GUIDELINES. YOU MAY HAVE UP TO 4 VISITORS  MAY VISIT IN THE EXTENDED RECOVERY ROOM UNTIL 800 PM ONLY.  ONE  VISITOR AGE 57 AND OVER MAY SPEND THE NIGHT AND MUST BE IN EXTENDED RECOVERY ROOM NO LATER THAN 800 PM . YOUR DISCHARGE TIME AFTER YOU SPEND THE NIGHT IS 900 AM THE MORNING AFTER YOUR SURGERY. YOU MAY PACK A SMALL OVERNIGHT BAG WITH TOILETRIES FOR YOUR OVERNIGHT STAY IF YOU WISH.  REGARDLESS OF IF YOU STAY OVER NIGHT OR ARE DISCHARGED THE SAME DAY YOU WILL BE REQUIRED TO HAVE A RESPONSIBLE ADULT (18 YRS OLD OR OLDER) STAY WITH YOU FOR AT LEAST THE FIRST 67 HOURS WHEN HOME.  YOUR PRESCRIPTION MEDICATIONS WILL BE PROVIDED DURING [facility] STAY.  ________________________________________________________________________

## 2023-01-24 NOTE — Progress Notes (Signed)
Addendum:  on 01-24-2023 Chart reviewed w/ anesthesia , Dr Stephannie Peters MDA, including the symptoms patient explained to me today.  Dr Stephannie Peters MDA stated patient needs to moved to main OR.  Called and spoke w/ Darlene Hayes, OR scheduler for Dr Vergie Living, informed of anesthesia recommendation for patient to done in main OR setting than a ambulatory surgery center setting and Darlene Hayes I would call patient and let know     Spoke w/ via phone for pre-op interview--- pt Lab needs dos---- cbc, cmp, magnesium, t&s, urine preg        Lab results------ current EKG in epic/ chart COVID test -----patient states asymptomatic no test needed Arrive at -------  0530 on 01-29-2023 NPO after MN  Med rec completed Medications to take morning of surgery ----- none Diabetic medication ----- n/a Patient instructed no nail polish to be worn day of surgery Patient instructed to bring photo id and insurance card day of surgery Patient aware to have Driver (ride ) / caregiver    for 24 hours after surgery - mother, Darlene Hayes Patient Special Instructions ----- pt is picking up bag with hibiclens and written instructions today 01-24-2023 @ [facility] front desk.  Since patient is having her pre-op labs done same day. Pre-Op special Instructions -----  pt has cardiac clearance office visit by Dr Kirtland Bouchard. Tobb dated 12-24-2022 in epic/ chart. Patient verbalized understanding of instructions that were given at this phone interview. Patient denies chest pain, sob, fever, cough at the interview.   Anesthesia :   Severe symptomatic anemia hospital admission 11-06-2022 w/ Hg 2.6.  Worsening longstanding menorrhagia secondary to uterine fibroids, transfused 5 units. Echo done for SOB, normal ef but moderate to severe MR no stenosis and RVSP 36.9 , outpatient cardiology referral. Day of discharge , HG 8.2.  Referred to hematology whom started IV iron infuison's. Patient had cardiology office visit w/ Dr Kirtland Bouchard. Tobb whom gave clearance to proceed with surgery on  12-24-2022.  Then patient went to cardiologist in [place], [place] for second opinion , Dr Elvera Lennox. Coralee Pesa, note in care everywhere 11-29-2022 and whom did a TEE on patient 12-03-2022 (result in care everywhere). Pt went to ED on 01-15-2023 with central CP no radiation and SOB triage visit, lab draw done, CXR and EKG done but patient did not see a provider.  Seems hematology aware the next day communication note by nurse in office.  Hematology did last CBCdiff on 01-20-2023, HG 9.0, ordered two iron infusions which are scheduled prior to surgery. On 01-24-2023 patient stated symptoms with chest pain central middle of chest , comes and goes, seems to happen more when she up moving around doing a lot then gets weak resolved after lays down for 30 minutes to rest.  Also, intermittent heart flutters ,SOB, and fatigue.

## 2023-01-27 ENCOUNTER — Encounter: Payer: Self-pay | Admitting: Obstetrics and Gynecology

## 2023-01-27 ENCOUNTER — Other Ambulatory Visit: Payer: Medicaid Other

## 2023-01-27 ENCOUNTER — Other Ambulatory Visit: Payer: Self-pay

## 2023-01-27 DIAGNOSIS — D500 Iron deficiency anemia secondary to blood loss (chronic): Secondary | ICD-10-CM

## 2023-01-27 LAB — CBC
Hematocrit: 28.9 % — ABNORMAL LOW (ref 34.0–46.6)
Hemoglobin: 9.5 g/dL — ABNORMAL LOW (ref 11.1–15.9)
MCH: 30.9 pg (ref 26.6–33.0)
MCHC: 32.9 g/dL (ref 31.5–35.7)
MCV: 94 fL (ref 79–97)
Platelets: 283 10*3/uL (ref 150–450)
RBC: 3.07 x10E6/uL — ABNORMAL LOW (ref 3.77–5.28)
RDW: 22.8 % — ABNORMAL HIGH (ref 11.7–15.4)
WBC: 4.1 10*3/uL (ref 3.4–10.8)

## 2023-01-27 NOTE — Progress Notes (Signed)
Called patient to set time for her to pick up bag with hibiclens and written instructions for her surgery that I let her know is going to be done at [facility] per her surgeon.  Per patient she will pick it up after her iron infusion about 1600 @ front desk [facility].  Patient asking questions about why did Dr Vergie Living leave her surgery at [facility] then her father got on the phone and had stated that the two cardiologist in [place], [place] recommended she have her surgery in main OR setting with a cardiologist in the room.  Let father know it would be possible for her to have surgery in main OR room but not have a cardiologist in the room.  The anesthesiologist would be in the room whom knows about cardiology and cardiology can be called in if needed.  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  Patient has decided to think about it over night and call me in the morning to proceed at [facility] or be reschedule at main OR.

## 2023-01-28 ENCOUNTER — Ambulatory Visit: Payer: Medicaid Other

## 2023-01-28 ENCOUNTER — Telehealth: Payer: Self-pay

## 2023-01-28 VITALS — BP 116/76 | HR 94 | Temp 98.3°F | Resp 20 | Ht 66.0 in | Wt 232.6 lb

## 2023-01-28 DIAGNOSIS — D649 Anemia, unspecified: Secondary | ICD-10-CM

## 2023-01-28 DIAGNOSIS — N938 Other specified abnormal uterine and vaginal bleeding: Secondary | ICD-10-CM | POA: Diagnosis not present

## 2023-01-28 DIAGNOSIS — D500 Iron deficiency anemia secondary to blood loss (chronic): Secondary | ICD-10-CM | POA: Diagnosis not present

## 2023-01-28 MED ORDER — SODIUM CHLORIDE 0.9 % IV SOLN
510.0000 mg | Freq: Once | INTRAVENOUS | Status: AC
  Administered 2023-01-28: 510 mg via INTRAVENOUS
  Filled 2023-01-28: qty 17

## 2023-01-28 MED ORDER — ACETAMINOPHEN 325 MG PO TABS: 650.0000 mg | ORAL_TABLET | Freq: Once | ORAL | Status: DC

## 2023-01-28 MED ORDER — DIPHENHYDRAMINE HCL 25 MG PO CAPS: 25.0000 mg | ORAL_CAPSULE | Freq: Once | ORAL | Status: DC

## 2023-01-28 NOTE — Telephone Encounter (Signed)
Patient and I discussed that Dr. Vergie Living received cardiac clearance from two different providers in reference to her surgery at [facility] on 01/29/23. I also advised that Dr. Vergie Living  is willing to proceed with surgery at [facility] on 01/29/23 and the patient needs to have the surgery completed as soon as possible. Patient strongly insisted on canceling the surgery on 01/29/23 @[facility]  and wait until March in order to have surgery completed at [facility]. Advised I would provide Dr. Vergie Living with the information and cancel her procedure at [facility] and have her rescheduled in March at [facility]. Patient will receive new surgery details via Mychart.

## 2023-01-28 NOTE — Progress Notes (Signed)
Diagnosis: Iron Deficiency Anemia  Provider:  Chilton Greathouse MD  Procedure: IV Infusion  IV Type: Peripheral, IV Location: R Hand  Feraheme (Ferumoxytol), Dose: 510 mg  Patient refused pre-medications. Nurse educated patient and stressed the importance of taking pre-medications as a precaution in the event of a medication reaction. Patient verbalized understanding.   Infusion Start Time: 1558  Infusion Stop Time: 1614  Post Infusion IV Care: Patient declined observation and Peripheral IV Discontinued  Discharge: Condition: Good, Destination: Home . AVS Declined  Performed by:  Loney Hering, LPN

## 2023-01-28 NOTE — Telephone Encounter (Signed)
Called patient to discuss surgery w/ Dr. Vergie Living on 01/29/23. Patient states based on the observation of the [facility] anesthesiologist, she would like to wait to have her surgery completed at [facility]. Patient is aware that Dr. Vergie Living first available is 04/01/23. I will cancel and reschedule the procedure for 04/01/23.

## 2023-01-29 DIAGNOSIS — Z01818 Encounter for other preprocedural examination: Secondary | ICD-10-CM

## 2023-01-30 ENCOUNTER — Telehealth: Payer: Self-pay | Admitting: Family Medicine

## 2023-01-30 ENCOUNTER — Encounter: Payer: Self-pay | Admitting: Family Medicine

## 2023-01-30 NOTE — Telephone Encounter (Signed)
Patient called wanting to speak to Dr.Pratt regarding her FMLA. After explaining to the patient that someone else outside of the office handles FMLA the patient explained to me that she needs documentation regarding her surgery being that it was rescheduled. Message sent to Antionette asking her to give the patient a call back today.

## 2023-02-03 ENCOUNTER — Ambulatory Visit: Payer: Medicaid Other

## 2023-02-03 ENCOUNTER — Other Ambulatory Visit: Payer: Medicaid Other

## 2023-02-22 ENCOUNTER — Other Ambulatory Visit: Payer: Self-pay | Admitting: Obstetrics and Gynecology

## 2023-02-24 ENCOUNTER — Other Ambulatory Visit: Payer: Self-pay | Admitting: Obstetrics and Gynecology

## 2023-02-24 DIAGNOSIS — D500 Iron deficiency anemia secondary to blood loss (chronic): Secondary | ICD-10-CM

## 2023-02-24 NOTE — Progress Notes (Signed)
 cbc

## 2023-02-26 ENCOUNTER — Other Ambulatory Visit: Payer: Medicaid Other

## 2023-02-26 ENCOUNTER — Other Ambulatory Visit: Payer: Self-pay

## 2023-02-26 DIAGNOSIS — D500 Iron deficiency anemia secondary to blood loss (chronic): Secondary | ICD-10-CM

## 2023-02-26 LAB — CBC
Hematocrit: 39.6 % (ref 34.0–46.6)
Hemoglobin: 12.9 g/dL (ref 11.1–15.9)
MCH: 32.2 pg (ref 26.6–33.0)
MCHC: 32.6 g/dL (ref 31.5–35.7)
MCV: 99 fL — ABNORMAL HIGH (ref 79–97)
Platelets: 189 10*3/uL (ref 150–450)
RBC: 4.01 x10E6/uL (ref 3.77–5.28)
RDW: 16.5 % — ABNORMAL HIGH (ref 11.7–15.4)
WBC: 4.3 10*3/uL (ref 3.4–10.8)

## 2023-02-27 ENCOUNTER — Encounter: Payer: Self-pay | Admitting: Obstetrics and Gynecology

## 2023-03-12 NOTE — Progress Notes (Addendum)
 Dr. Servando Salina and Leavy Cella,  Lynetta has been identified as a patient who may benefit from health coaching to develop healthy eating habits and increase exercise per your notes from 12/24/22. Please refer to [id number] or Care Navigation.   Renaee Munda, MS, ERHD, [facility]  Care Guide, Health & Wellness Coach [address] [place] [address] Telephone: [phone] Email: [email or web address]

## 2023-03-13 ENCOUNTER — Encounter: Payer: Self-pay | Admitting: Obstetrics and Gynecology

## 2023-03-13 ENCOUNTER — Encounter: Payer: Self-pay | Admitting: Cardiology

## 2023-03-13 ENCOUNTER — Ambulatory Visit: Payer: Medicaid Other | Attending: Cardiology | Admitting: Cardiology

## 2023-03-13 VITALS — BP 110/74 | HR 67 | Ht 66.0 in | Wt 248.2 lb

## 2023-03-13 DIAGNOSIS — I340 Nonrheumatic mitral (valve) insufficiency: Secondary | ICD-10-CM | POA: Diagnosis not present

## 2023-03-13 DIAGNOSIS — Z79899 Other long term (current) drug therapy: Secondary | ICD-10-CM

## 2023-03-13 DIAGNOSIS — I272 Pulmonary hypertension, unspecified: Secondary | ICD-10-CM | POA: Diagnosis not present

## 2023-03-13 DIAGNOSIS — Z8759 Personal history of other complications of pregnancy, childbirth and the puerperium: Secondary | ICD-10-CM | POA: Diagnosis not present

## 2023-03-13 DIAGNOSIS — Z8632 Personal history of gestational diabetes: Secondary | ICD-10-CM | POA: Diagnosis not present

## 2023-03-13 DIAGNOSIS — D508 Other iron deficiency anemias: Secondary | ICD-10-CM

## 2023-03-13 DIAGNOSIS — O111 Pre-existing hypertension with pre-eclampsia, first trimester: Secondary | ICD-10-CM

## 2023-03-13 NOTE — Progress Notes (Signed)
 Cardiology Office Note:    Date:  03/13/2023   ID:  Darlene Hayes, DOB 11-30-1985, MRN [id number]  PCP:  Melven Sartorius, MD  Cardiologist:  Thomasene Ripple, DO  Electrophysiologist:  None   Referring MD: Melven Sartorius, MD   " I might be getting an hysterectomy"   History of Present Illness:    Darlene Hayes is a 38 y.o. female with a hx of hx of recently diagnosed severe anemia, status hospitalization - hg 2.6 on 11/06/2022, it appears her anemia in 07/2019 was 6.9- this is suspect to be from fibroids, moderate to severe mitral regurgitation, mildly elevated pulmonary hypertension, gestational diabetes and hypertension in pregnancy.  At her last visit I started the patient on Lasix to manage her fluid in the setting of her moderate to severe mitral regurgitation.  She was plan for gynecologic surgery which has not taken place yet.  She reports that she has been experiencing lower extremity edema.  She shares with me that her pharmacy has not been giving her medications.  They are not consistent with the refills.   She reports that the swelling is painful and sore, making it difficult to perform daily activities such as shaving. She also mentions that she has been gaining weight, which she believes may be due to a hormone medication prescribed by her OB GYN.  In addition to these symptoms, the patient is also dealing with ongoing bleeding, which has led to a low hemoglobin level. She has been taking an over-the-counter iron supplement, which she reports has helped to increase her energy levels and improve her skin. Despite these improvements, she still has moments of fatigue and shortness of breath, which she attributes to her recent weight gain.  The patient also mentions that she has been having issues with her current pharmacy, which has led to difficulties in obtaining her prescribed potassium medication. She expresses interest in switching to a different pharmacy that offers home  delivery.   Past Medical History:  Diagnosis Date   Abnormal uterine bleeding (AUB)    History of abnormal cervical Pap smear    since 2000   History of chlamydia infection 07/2010   treated. hasn't been with that partner since.    History of gestational diabetes    History of gonorrhea    treated in July 2012. hasn't been with that partner since.    History of pregnancy induced hypertension    Iron deficiency anemia due to chronic blood loss    referred by hematology--- dr Geanie Berlin---   IV iron infusion,  [id number];  12-14-2022;   01-21-2023;  01-28-2023   Moderate to severe mitral regurgitation 11/06/2022   evalulated by cardiologoy-- dr Kirtland Bouchard. Collyn Selk note in epic 12-24-2022;  per echo 11-07-2022 ef 50-55%,  RVSP 63.9,  mod-sev LAE, mild TR;   pt also was seen by another cardiology in [place] -- Dr Shella Maxim note in care everywhere 11-29-2022,  TEE 12-03-2022 , ef 60-65%,  mild-mod MR RVSP normal, normal LAE   Symptomatic anemia    (01-24-2023  pt stated symptoms are chest pain, intermittant heart flutters, sob, weakness, fatigue)  admission in epic 11-06-2022  --- SOB / CP/  vaginal bleeding /  Hg2.6;   transfused 3units PRBC and 2 iron infusions ;  also referred to cardiology due to echo w/ moderate to severe MR unspecified pulm HTN   Uterine fibroid     Past Surgical History:  Procedure Laterality Date   DILATION  AND CURETTAGE OF UTERUS  12/2016   EAB done in [place]   TEE WITHOUT CARDIOVERSION  12/03/2022   [place] , [place] by dr Shella Maxim    Current Medications: Current Meds  Medication Sig   Cholecalciferol (VITAMIN D-3) 25 MCG (1000 UT) CAPS Take 1 capsule by mouth daily.   Cyanocobalamin (B-12) 1000 MCG CAPS Take 1 capsule by mouth daily.   furosemide (LASIX) 20 MG tablet Take 1 tablet (20 mg total) by mouth 2 (two) times a week. On Tuesday and Friday. (Patient taking differently: Take 20 mg by mouth 2 (two) times a week. On Tuesday and Friday.)   Multiple  Vitamins-Minerals (MULTIVITAMIN WOMEN PO) Take 1 capsule by mouth daily.   norethindrone (AYGESTIN) 5 MG tablet Take 1 tablet (5 mg total) by mouth in the morning, at noon, and at bedtime.   Current Facility-Administered Medications for the 03/13/23 encounter (Office Visit) with Thomasene Ripple, DO  Medication   acetaminophen (TYLENOL) tablet 650 mg   diphenhydrAMINE (BENADRYL) capsule 25 mg     Allergies:   Patient has no known allergies.   Social History   Socioeconomic History   Marital status: Single    Spouse name: Not on file   Number of children: Not on file   Years of education: Not on file   Highest education level: Not on file  Occupational History   Not on file  Tobacco Use   Smoking status: Former    Types: Cigarettes   Smokeless tobacco: Never   Tobacco comments:    01-24-2023  pt stated quit smoking 10/ 2024,  started age 89  Vaping Use   Vaping status: Former   Devices: quit  2023  Substance and Sexual Activity   Alcohol use: No   Drug use: Not Currently    Types: Marijuana    Comment: 01-24-2023  per pt last smoked marijuana 10/ 2024   Sexual activity: Not on file  Other Topics Concern   Not on file  Social History Narrative   Not on file   Social Drivers of Health   Financial Resource Strain: Not on file  Food Insecurity: Food Insecurity Present (11/13/2022)   Hunger Vital Sign    Worried About Running Out of Food in the Last Year: Sometimes true    Ran Out of Food in the Last Year: Sometimes true  Transportation Needs: No Transportation Needs (11/13/2022)   PRAPARE - Administrator, Civil Service (Medical): No    Lack of Transportation (Non-Medical): No  Physical Activity: Not on file  Stress: Not on file  Social Connections: Not on file     Family History: The patient's family history includes Diabetes in her maternal grandmother; Hypertension in her mother and sister; Miscarriages / [place] in her sister; Stroke in her maternal  grandmother.  ROS:   Review of Systems  Constitution: Negative for decreased appetite, fever and weight gain.  HENT: Negative for congestion, ear discharge, hoarse voice and sore throat.   Eyes: Negative for discharge, redness, vision loss in right eye and visual halos.  Cardiovascular: Negative for chest pain, dyspnea on exertion, leg swelling, orthopnea and palpitations.  Respiratory: Negative for cough, hemoptysis, shortness of breath and snoring.   Endocrine: Negative for heat intolerance and polyphagia.  Hematologic/Lymphatic: Negative for bleeding problem. Does not bruise/bleed easily.  Skin: Negative for flushing, nail changes, rash and suspicious lesions.  Musculoskeletal: Negative for arthritis, joint pain, muscle cramps, myalgias, neck pain and stiffness.  Gastrointestinal: Negative  for abdominal pain, bowel incontinence, diarrhea and excessive appetite.  Genitourinary: Negative for decreased libido, genital sores and incomplete emptying.  Neurological: Negative for brief paralysis, focal weakness, headaches and loss of balance.  Psychiatric/Behavioral: Negative for altered mental status, depression and suicidal ideas.  Allergic/Immunologic: Negative for HIV exposure and persistent infections.    EKGs/Labs/Other Studies Reviewed:    The following studies were reviewed today:   EKG:  None today  Recent Labs: 11/08/2022: Magnesium 2.2 11/13/2022: ALT 14; TSH 2.160 01/15/2023: BUN 11; Creatinine, Ser 0.79; Potassium 3.6; Sodium 140 02/26/2023: Hemoglobin 12.9; Platelets 189  Recent Lipid Panel    Component Value Date/Time   CHOL 154 07/02/2019 1056   TRIG 38 07/02/2019 1056   HDL 55 07/02/2019 1056   CHOLHDL 2.8 07/02/2019 1056   LDLCALC 90 07/02/2019 1056    Physical Exam:    VS:  BP 110/74 (BP Location: Right Arm, Patient Position: Sitting, Cuff Size: Large)   Pulse 67   Ht 5\' 6"  (1.676 m)   Wt 248 lb 3.2 oz (112.6 kg)   SpO2 98%   BMI 40.06 kg/m     Wt  Readings from Last 3 Encounters:  03/13/23 248 lb 3.2 oz (112.6 kg)  01/28/23 232 lb 9.6 oz (105.5 kg)  01/21/23 225 lb 9.6 oz (102.3 kg)     GEN: Well nourished, well developed in no acute distress HEENT: Normal NECK: No JVD; No carotid bruits LYMPHATICS: No lymphadenopathy CARDIAC: S1S2 noted,RRR, no murmurs, rubs, gallops RESPIRATORY:  Clear to auscultation without rales, wheezing or rhonchi  ABDOMEN: Soft, non-tender, non-distended, +bowel sounds, no guarding. EXTREMITIES: No edema, No cyanosis, no clubbing MUSCULOSKELETAL:  No deformity  SKIN: Warm and dry NEUROLOGIC:  Alert and oriented x 3, non-focal PSYCHIATRIC:  Normal affect, good insight  ASSESSMENT:    1. Moderate to severe mitral regurgitation   2. Pulmonary hypertension, unspecified (HCC)   3. History of gestational diabetes   4. Hypertensive disorder in pregnancy   5. Medication management   6. Other iron deficiency anemia     PLAN:     Moderate to severe regurgitation-clinically she appears to be fluid overloaded.  She is taking her Lasix but this may need to be adjusted.  She does have leg swelling in the office and some weight gain.  Once I get her labs back and her echocardiogram we will make further decisions.   - Order repeat echocardiogram. - Schedule follow-up in four months.  Anemia Low hemoglobin with ongoing bleeding. Iron supplement improved energy. Blood work needed. - Order blood work for hemoglobin levels.  Scheduled procedure Procedure on March 25th can proceed. EKG needed pre-procedure. - Ensure EKG is scheduled before March 25th.  Medication management issues Issues with current pharmacy. Suggest switching to improve service and delivery. - Change pharmacy to one with better service and home delivery-[facility]  The patient understands the need to lose weight with diet and exercise. We have discussed specific strategies for this. -    Follow-up in 4 weeks post  hysterectomy to reassess anemia and plan further cardiac workup.   The patient is in agreement with the above plan. The patient left the office in stable condition.  The patient will follow up in   Medication Adjustments/Labs and Tests Ordered: Current medicines are reviewed at length with the patient today.  Concerns regarding medicines are outlined above.  Orders Placed This Encounter  Procedures   Comprehensive Metabolic Panel (CMET)   Magnesium  CBC with Differential/Platelet   ECHOCARDIOGRAM COMPLETE   No orders of the defined types were placed in this encounter.   Patient Instructions  Medication Instructions:  Your physician recommends that you continue on your current medications as directed. Please refer to the Current Medication list given to you today.  *If you need a refill on your cardiac medications before your next appointment, please call your pharmacy*   Lab Work: CMET, Mag, CBC If you have labs (blood work) drawn today and your tests are completely normal, you will receive your results only by: MyChart Message (if you have MyChart) OR A paper copy in the mail If you have any lab test that is abnormal or we need to change your treatment, we will call you to review the results.   Testing/Procedures: Your physician has requested that you have an echocardiogram. Echocardiography is a painless test that uses sound waves to create images of your heart. It provides your doctor with information about the size and shape of your heart and how well your heart's chambers and valves are working. This procedure takes approximately one hour. There are no restrictions for this procedure. Please do NOT wear cologne, perfume, aftershave, or lotions (deodorant is allowed). Please arrive 15 minutes prior to your appointment time.  Please note: We ask at that you not bring children with you during ultrasound (echo/ vascular) testing. Due to room size and safety concerns, children  are not allowed in the ultrasound rooms during exams. Our front office staff cannot provide observation of children in our lobby area while testing is being conducted. An adult accompanying a patient to their appointment will only be allowed in the ultrasound room at the discretion of the ultrasound technician under special circumstances. We apologize for any inconvenience.    Follow-Up: At [facility], you and your health needs are our priority.  As part of our continuing mission to provide you with exceptional heart care, we have created designated Provider Care Teams.  These Care Teams include your primary Cardiologist (physician) and Advanced Practice Providers (APPs -  Physician Assistants and Nurse Practitioners) who all work together to provide you with the care you need, when you need it.   Your next appointment:   4-5 month(s)  Provider:   Thomasene Ripple, DO      Adopting a Healthy Lifestyle.  Know what a healthy weight is for you (roughly BMI <25) and aim to maintain this   Aim for 7+ servings of fruits and vegetables daily   65-80+ fluid ounces of water or unsweet tea for healthy kidneys   Limit to max 1 drink of alcohol per day; avoid smoking/tobacco   Limit animal fats in diet for cholesterol and heart health - choose grass fed whenever available   Avoid highly processed foods, and foods high in saturated/trans fats   Aim for low stress - take time to unwind and care for your mental health   Aim for 150 min of moderate intensity exercise weekly for heart health, and weights twice weekly for bone health   Aim for 7-9 hours of sleep daily   When it comes to diets, agreement about the perfect plan isnt easy to find, even among the experts. Experts at the [facility] [employer] developed an idea known as the Healthy Eating Plate. Just imagine a plate divided into logical, healthy portions.   The emphasis is on diet quality:   Load up on vegetables and  fruits - one-half of  your plate: Aim for color and variety, and remember that potatoes dont count.   Go for whole grains - one-quarter of your plate: Whole wheat, barley, wheat berries, quinoa, oats, brown rice, and foods made with them. If you want pasta, go with whole wheat pasta.   Protein power - one-quarter of your plate: Fish, chicken, beans, and nuts are all healthy, versatile protein sources. Limit red meat.   The diet, however, does go beyond the plate, offering a few other suggestions.   Use healthy plant oils, such as olive, canola, soy, corn, sunflower and peanut. Check the labels, and avoid partially hydrogenated oil, which have unhealthy trans fats.   If youre thirsty, drink water. Coffee and tea are good in moderation, but skip sugary drinks and limit milk and dairy products to one or two daily servings.   The type of carbohydrate in the diet is more important than the amount. Some sources of carbohydrates, such as vegetables, fruits, whole grains, and beans-are healthier than others.   Finally, stay active  Signed, Thomasene Ripple, DO  03/13/2023 12:20 PM    [facility]

## 2023-03-13 NOTE — Patient Instructions (Addendum)
 Medication Instructions:  Your physician recommends that you continue on your current medications as directed. Please refer to the Current Medication list given to you today.  *If you need a refill on your cardiac medications before your next appointment, please call your pharmacy*   Lab Work: CMET, Mag, CBC If you have labs (blood work) drawn today and your tests are completely normal, you will receive your results only by: MyChart Message (if you have MyChart) OR A paper copy in the mail If you have any lab test that is abnormal or we need to change your treatment, we will call you to review the results.   Testing/Procedures: Your physician has requested that you have an echocardiogram. Echocardiography is a painless test that uses sound waves to create images of your heart. It provides your doctor with information about the size and shape of your heart and how well your heart's chambers and valves are working. This procedure takes approximately one hour. There are no restrictions for this procedure. Please do NOT wear cologne, perfume, aftershave, or lotions (deodorant is allowed). Please arrive 15 minutes prior to your appointment time.  Please note: We ask at that you not bring children with you during ultrasound (echo/ vascular) testing. Due to room size and safety concerns, children are not allowed in the ultrasound rooms during exams. Our front office staff cannot provide observation of children in our lobby area while testing is being conducted. An adult accompanying a patient to their appointment will only be allowed in the ultrasound room at the discretion of the ultrasound technician under special circumstances. We apologize for any inconvenience.    Follow-Up: At [facility], you and your health needs are our priority.  As part of our continuing mission to provide you with exceptional heart care, we have created designated Provider Care Teams.  These Care Teams include  your primary Cardiologist (physician) and Advanced Practice Providers (APPs -  Physician Assistants and Nurse Practitioners) who all work together to provide you with the care you need, when you need it.   Your next appointment:   4-5 month(s)  Provider:   Thomasene Ripple, DO

## 2023-03-14 ENCOUNTER — Ambulatory Visit (HOSPITAL_COMMUNITY): Admission: RE | Admit: 2023-03-14 | Source: Ambulatory Visit | Attending: Cardiology | Admitting: Cardiology

## 2023-03-14 LAB — CBC WITH DIFFERENTIAL/PLATELET
Basophils Absolute: 0 10*3/uL (ref 0.0–0.2)
Basos: 1 %
EOS (ABSOLUTE): 0.2 10*3/uL (ref 0.0–0.4)
Eos: 4 %
Hematocrit: 42.5 % (ref 34.0–46.6)
Hemoglobin: 14 g/dL (ref 11.1–15.9)
Immature Grans (Abs): 0 10*3/uL (ref 0.0–0.1)
Immature Granulocytes: 0 %
Lymphocytes Absolute: 1.5 10*3/uL (ref 0.7–3.1)
Lymphs: 34 %
MCH: 32.1 pg (ref 26.6–33.0)
MCHC: 32.9 g/dL (ref 31.5–35.7)
MCV: 98 fL — ABNORMAL HIGH (ref 79–97)
Monocytes Absolute: 0.3 10*3/uL (ref 0.1–0.9)
Monocytes: 8 %
Neutrophils Absolute: 2.4 10*3/uL (ref 1.4–7.0)
Neutrophils: 53 %
Platelets: 225 10*3/uL (ref 150–450)
RBC: 4.36 x10E6/uL (ref 3.77–5.28)
RDW: 15.8 % — ABNORMAL HIGH (ref 11.7–15.4)
WBC: 4.4 10*3/uL (ref 3.4–10.8)

## 2023-03-14 LAB — COMPREHENSIVE METABOLIC PANEL
ALT: 29 IU/L (ref 0–32)
AST: 33 IU/L (ref 0–40)
Albumin: 4.6 g/dL (ref 3.9–4.9)
Alkaline Phosphatase: 96 IU/L (ref 44–121)
BUN/Creatinine Ratio: 11 (ref 9–23)
BUN: 9 mg/dL (ref 6–20)
Bilirubin Total: 0.2 mg/dL (ref 0.0–1.2)
CO2: 22 mmol/L (ref 20–29)
Calcium: 9.8 mg/dL (ref 8.7–10.2)
Chloride: 101 mmol/L (ref 96–106)
Creatinine, Ser: 0.8 mg/dL (ref 0.57–1.00)
Globulin, Total: 3 g/dL (ref 1.5–4.5)
Glucose: 77 mg/dL (ref 70–99)
Potassium: 4.3 mmol/L (ref 3.5–5.2)
Sodium: 140 mmol/L (ref 134–144)
Total Protein: 7.6 g/dL (ref 6.0–8.5)
eGFR: 97 mL/min/{1.73_m2} (ref >59)

## 2023-03-14 LAB — MAGNESIUM: Magnesium: 2.2 mg/dL (ref 1.6–2.3)

## 2023-03-17 ENCOUNTER — Encounter: Payer: Self-pay | Admitting: Cardiology

## 2023-03-21 ENCOUNTER — Ambulatory Visit (HOSPITAL_COMMUNITY)
Admission: RE | Admit: 2023-03-21 | Discharge: 2023-03-21 | Disposition: A | Discharge status: Home / Self Care | Source: Ambulatory Visit | Attending: Cardiology | Admitting: Cardiology

## 2023-03-21 DIAGNOSIS — I340 Nonrheumatic mitral (valve) insufficiency: Secondary | ICD-10-CM | POA: Diagnosis present

## 2023-03-21 LAB — ECHOCARDIOGRAM COMPLETE
Calc EF: 42.8 %
S' Lateral: 3.1 cm
Single Plane A2C EF: 54.3 %
Single Plane A4C EF: 51.3 %

## 2023-03-25 ENCOUNTER — Other Ambulatory Visit: Payer: Self-pay

## 2023-03-25 NOTE — Pre-Procedure Instructions (Signed)
 Surgical Instructions   Your procedure is scheduled on Tuesday, March 25th. Report to [facility] Main Entrance "A" at 05:30 A.M., then check in with the Admitting office. Any questions or running late day of surgery: call [phone]  Questions prior to your surgery date: call [phone], Monday-Friday, 8am-4pm. If you experience any cold or flu symptoms such as cough, fever, chills, shortness of breath, etc. between now and your scheduled surgery, please notify us at the above number.     Remember:  Do not eat after midnight the night before your surgery   You may drink clear liquids until 04:30 AM the morning of your surgery.   Clear liquids allowed are: Water, Non-Citrus Juices (without pulp), Carbonated Beverages, Clear Tea (no milk, honey, etc.), Black Coffee Only (NO MILK, CREAM OR POWDERED CREAMER of any kind), and Gatorade.    Take these medicines the morning of surgery with A SIP OF WATER: NONE    One week prior to surgery, STOP taking any Aspirin (unless otherwise instructed by your surgeon) Aleve, Naproxen, Ibuprofen, Motrin, Advil, Goody's, BC's, all herbal medications, fish oil, and non-prescription vitamins.                     Do NOT Smoke (Tobacco/Vaping) for 24 hours prior to your procedure.  If you use a CPAP at night, you may bring your mask/headgear for your overnight stay.   You will be asked to remove any contacts, glasses, piercing's, hearing aid's, dentures/partials prior to surgery. Please bring cases for these items if needed.    Patients discharged the day of surgery will not be allowed to drive home, and someone needs to stay with them for 24 hours.  SURGICAL WAITING ROOM VISITATION Patients may have no more than 2 support people in the waiting area - these visitors may rotate.   Pre-op nurse will coordinate an appropriate time for 1 ADULT support person, who may not rotate, to accompany patient in pre-op.  Children under the age of 36 must have an  adult with them who is not the patient and must remain in the main waiting area with an adult.  If the patient needs to stay at the hospital during part of their recovery, the visitor guidelines for inpatient rooms apply.  Please refer to the [facility] website for the visitor guidelines for any additional information.   If you received a COVID test during your pre-op visit  it is requested that you wear a mask when out in public, stay away from anyone that may not be feeling well and notify your surgeon if you develop symptoms. If you have been in contact with anyone that has tested positive in the last 10 days please notify you surgeon.      Pre-operative CHG Bathing Instructions   You can play a key role in reducing the risk of infection after surgery. Your skin needs to be as free of germs as possible. You can reduce the number of germs on your skin by washing with CHG (chlorhexidine gluconate) soap before surgery. CHG is an antiseptic soap that kills germs and continues to kill germs even after washing.   DO NOT use if you have an allergy to chlorhexidine/CHG or antibacterial soaps. If your skin becomes reddened or irritated, stop using the CHG and notify one of our RNs at [phone].              TAKE A SHOWER THE NIGHT BEFORE SURGERY AND THE DAY OF  SURGERY    Please keep in mind the following:  DO NOT shave, including legs and underarms, 48 hours prior to surgery.   You may shave your face before/day of surgery.  Place clean sheets on your bed the night before surgery Use a clean washcloth (not used since being washed) for each shower. DO NOT sleep with pet's night before surgery.  CHG Shower Instructions:  Wash your face and private area with normal soap. If you choose to wash your hair, wash first with your normal shampoo.  After you use shampoo/soap, rinse your hair and body thoroughly to remove shampoo/soap residue.  Turn the water OFF and apply half the bottle of CHG soap  to a CLEAN washcloth.  Apply CHG soap ONLY FROM YOUR NECK DOWN TO YOUR TOES (washing for 3-5 minutes)  DO NOT use CHG soap on face, private areas, open wounds, or sores.  Pay special attention to the area where your surgery is being performed.  If you are having back surgery, having someone wash your back for you may be helpful. Wait 2 minutes after CHG soap is applied, then you may rinse off the CHG soap.  Pat dry with a clean towel  Put on clean pajamas    Additional instructions for the day of surgery: DO NOT APPLY any lotions, deodorants, cologne, or perfumes.   Do not wear jewelry or makeup Do not wear nail polish, gel polish, artificial nails, or any other type of covering on natural nails (fingers and toes) Do not bring valuables to the hospital. [facility] is not responsible for valuables/personal belongings. Put on clean/comfortable clothes.  Please brush your teeth.  Ask your nurse before applying any prescription medications to the skin.

## 2023-03-26 ENCOUNTER — Other Ambulatory Visit: Payer: Self-pay

## 2023-03-26 ENCOUNTER — Encounter (HOSPITAL_COMMUNITY): Payer: Self-pay

## 2023-03-26 ENCOUNTER — Encounter (HOSPITAL_COMMUNITY)
Admission: RE | Admit: 2023-03-26 | Discharge: 2023-03-26 | Disposition: A | Discharge status: Home / Self Care | Source: Ambulatory Visit | Attending: Obstetrics and Gynecology | Admitting: Obstetrics and Gynecology

## 2023-03-26 DIAGNOSIS — N939 Abnormal uterine and vaginal bleeding, unspecified: Secondary | ICD-10-CM | POA: Insufficient documentation

## 2023-03-26 DIAGNOSIS — Z01812 Encounter for preprocedural laboratory examination: Secondary | ICD-10-CM | POA: Diagnosis not present

## 2023-03-26 DIAGNOSIS — Z87891 Personal history of nicotine dependence: Secondary | ICD-10-CM | POA: Diagnosis not present

## 2023-03-26 DIAGNOSIS — I272 Pulmonary hypertension, unspecified: Secondary | ICD-10-CM | POA: Insufficient documentation

## 2023-03-26 DIAGNOSIS — I340 Nonrheumatic mitral (valve) insufficiency: Secondary | ICD-10-CM | POA: Insufficient documentation

## 2023-03-26 DIAGNOSIS — D259 Leiomyoma of uterus, unspecified: Secondary | ICD-10-CM | POA: Insufficient documentation

## 2023-03-26 DIAGNOSIS — Z01818 Encounter for other preprocedural examination: Secondary | ICD-10-CM | POA: Diagnosis present

## 2023-03-26 DIAGNOSIS — D500 Iron deficiency anemia secondary to blood loss (chronic): Secondary | ICD-10-CM | POA: Insufficient documentation

## 2023-03-26 HISTORY — DX: Pulmonary hypertension, unspecified: I27.20

## 2023-03-26 HISTORY — DX: Nonrheumatic mitral (valve) insufficiency: I34.0

## 2023-03-26 LAB — COMPREHENSIVE METABOLIC PANEL
ALT: 28 U/L (ref 0–44)
AST: 32 U/L (ref 15–41)
Albumin: 3.7 g/dL (ref 3.5–5.0)
Alkaline Phosphatase: 65 U/L (ref 38–126)
Anion gap: 7 (ref 5–15)
BUN: 10 mg/dL (ref 6–20)
CO2: 27 mmol/L (ref 22–32)
Calcium: 9.3 mg/dL (ref 8.9–10.3)
Chloride: 103 mmol/L (ref 98–111)
Creatinine, Ser: 0.76 mg/dL (ref 0.44–1.00)
GFR, Estimated: >60 mL/min (ref >60)
Glucose, Bld: 92 mg/dL (ref 70–99)
Potassium: 3.9 mmol/L (ref 3.5–5.1)
Sodium: 137 mmol/L (ref 135–145)
Total Bilirubin: 0.5 mg/dL (ref 0.0–1.2)
Total Protein: 7.5 g/dL (ref 6.5–8.1)

## 2023-03-26 LAB — TYPE AND SCREEN
ABO/RH(D): A POS
Antibody Screen: NEGATIVE

## 2023-03-26 LAB — MAGNESIUM: Magnesium: 2.2 mg/dL (ref 1.7–2.4)

## 2023-03-26 LAB — CBC
HCT: 38.7 % (ref 36.0–46.0)
Hemoglobin: 12.7 g/dL (ref 12.0–15.0)
MCH: 31.4 pg (ref 26.0–34.0)
MCHC: 32.8 g/dL (ref 30.0–36.0)
MCV: 95.8 fL (ref 80.0–100.0)
Platelets: 242 10*3/uL (ref 150–400)
RBC: 4.04 MIL/uL (ref 3.87–5.11)
RDW: 15.3 % (ref 11.5–15.5)
WBC: 3.9 10*3/uL — ABNORMAL LOW (ref 4.0–10.5)
nRBC: 0 % (ref 0.0–0.2)

## 2023-03-26 NOTE — Progress Notes (Signed)
 PCP - Dr. Leilani Able Cardiologist - Dr. Thomasene Ripple  PPM/ICD - denies Device Orders - na Rep Notified - na  Chest x-ray - na EKG - 01/15/2023 Stress Test -  ECHO - 03/21/2023 Cardiac Cath -   Sleep Study - denies CPAP - na  Non-diabetic  Blood Thinner Instructions: Aspirin Instructions:  ERAS Protcol - Clears until 4:30  Anesthesia review: Yes. Mitral regurgitation, pulmonary HTN, severe anemia  Patient denies shortness of breath, fever, cough and chest pain at PAT appointment   All instructions explained to the patient, with a verbal understanding of the material. Patient agrees to go over the instructions while at home for a better understanding. Patient also instructed to self quarantine after being tested for COVID-19. The opportunity to ask questions was provided.

## 2023-03-27 ENCOUNTER — Encounter (HOSPITAL_COMMUNITY): Payer: Self-pay

## 2023-03-27 NOTE — Anesthesia Preprocedure Evaluation (Addendum)
 Anesthesia Evaluation  Patient identified by MRN, date of birth, ID band Patient awake    Reviewed: Allergy & Precautions, NPO status , Patient's Chart, lab work & pertinent test results  History of Anesthesia Complications Negative for: history of anesthetic complications  Airway Mallampati: III  TM Distance: >3 FB Neck ROM: Full    Dental  (+) Teeth Intact   Pulmonary former smoker   breath sounds clear to auscultation       Cardiovascular hypertension,  Rhythm:Regular Rate:Normal     Neuro/Psych negative neurological ROS     GI/Hepatic ,GERD  ,,(+)     substance abuse  marijuana use  Endo/Other  negative endocrine ROS    Renal/GU negative Renal ROS     Musculoskeletal   Abdominal  (+) + obese  Peds  Hematology  (+) Blood dyscrasia, anemia   Anesthesia Other Findings   Reproductive/Obstetrics                             Anesthesia Physical Anesthesia Plan  ASA: II  Anesthesia Plan: General   Post-op Pain Management:    Induction: Intravenous  PONV Risk Score and Plan: 3 and Ondansetron, Dexamethasone, Midazolam and Treatment may vary due to age or medical condition  Airway Management Planned: Oral ETT  Additional Equipment:   Intra-op Plan:   Post-operative Plan: Extubation in OR  Informed Consent: I have reviewed the patients History and Physical, chart, labs and discussed the procedure including the risks, benefits and alternatives for the proposed anesthesia with the patient or authorized representative who has indicated his/her understanding and acceptance.       Plan Discussed with:   Anesthesia Plan Comments: (See PAT note from 3/19 by Sherlie Ban PA-C )        Anesthesia Quick Evaluation

## 2023-03-27 NOTE — Progress Notes (Signed)
 Case: [id number] Date/Time: 04/01/23 0715   Procedures:      TOTAL LAPAROSCOPIC HYSTERECTOMY WITH BILATERAL SALPINGECTOMY (Bilateral) - Can you ask the OR to have in the room, but not opened, both a Rumi uterine manipulator with a plastic cup and v-care uterine manipulators     CYSTOSCOPY   Anesthesia type: Choice   Pre-op diagnosis:      Abnormal uterine bleeding     Fibroids   Location: [facility] ROOM 02 / [facility] OR   Surgeons: Cannon Bing, MD       DISCUSSION: Darlene Hayes is a 38 yo female who presents to PAT prior to surgery above. PMH of former smoking, mitral regurgitation, pulmonary HTN, iron and blood loss anemia 2/2 AUB.  Patient admitted in Oct 2024 due to severe anemia with Hgb of 2.6. Pt also reported chest tightness and SOB. Cardiomegaly noted on CXR with abnormal EKG. Echo was obtained which showed normal LVEF and moderate to severe MR. Patient was transfused 5 units of blood and started on OCPs to stabilize bleeding.  Patient followed up with Cardiology on 12/24/22. She was cleared for surgery at that time: "The patient does not have any unstable cardiac conditions.  Upon evaluation today, she can achieve 4 METs or greater without anginal symptoms.  According to [facility] and AHA guidelines, she requires no further cardiac workup prior to her noncardiac surgery and should be at acceptable risk.  Our service is available as necessary in the perioperative period." Surgery was originally scheduled at surgery center in January but moved to main OR due to cardiac hx and so was postponed. Last seen by Dr. Servando Salina with Cardiology on 03/13/23. Noted to mildly volume overloaded, taking Lasix. Echo was repeated and MR improved to mild-moderate. Advised f/u in 4 months.    VS: BP 102/75   Pulse 78   Temp 36.8 C   Resp 16   Ht 5\' 6"  (1.676 m)   Wt 112.1 kg   LMP 03/19/2023 (Approximate) Comment: continuously bleeding since Oct 2024  SpO2 100%   BMI 39.88 kg/m   PROVIDERS: Leilani Able,  MD   LABS: Labs reviewed: Acceptable for surgery. (all labs ordered are listed, but only abnormal results are displayed)  Labs Reviewed  CBC - Abnormal; Notable for the following components:      Result Value   WBC 3.9 (*)    All other components within normal limits  COMPREHENSIVE METABOLIC PANEL  MAGNESIUM  TYPE AND SCREEN     IMAGES: CXR 01/15/23:  FINDINGS: Bilateral lung fields are clear. Bilateral costophrenic angles are clear.   Normal cardio-mediastinal silhouette.   No acute osseous abnormalities.   The soft tissues are within normal limits.   IMPRESSION: No active cardiopulmonary disease.  EKG 01/15/23:  Sinus rhythm, rate 95 Borderline short PR interval RSR' in V1 or V2, right VCD or RVH  CV:  Echo 03/21/23:   IMPRESSIONS     1. Left ventricular ejection fraction, by estimation, is 50 to 55%. The  left ventricle has low normal function. The left ventricle demonstrates  global hypokinesis. Left ventricular diastolic parameters are  indeterminate.   2. Right ventricular systolic function is normal. The right ventricular  size is normal.   3. Left atrial size was mildly dilated.   4. The mitral valve is abnormal. Mild mitral valve regurgitation.   5. The aortic valve is tricuspid. Aortic valve regurgitation is not  visualized.   Comparison(s): Changes from prior study are noted. 11/07/2022: LVEF  50-55%, moderate to severe MR.   Past Medical History:  Diagnosis Date   Abnormal uterine bleeding (AUB)    History of abnormal cervical Pap smear    since 2000   History of chlamydia infection 07/2010   treated. hasn't been with that partner since.    History of gestational diabetes    History of gonorrhea    treated in July 2012. hasn't been with that partner since.    History of pregnancy induced hypertension    Iron deficiency anemia due to chronic blood loss    referred by hematology--- dr Geanie Berlin---   IV iron infusion,  [id number];   12-14-2022;   01-21-2023;  01-28-2023   Mitral regurgitation    Moderate to severe mitral regurgitation 11/06/2022   evalulated by cardiologoy-- dr Kirtland Bouchard. tobb note in epic 12-24-2022;  per echo 11-07-2022 ef 50-55%,  RVSP 63.9,  mod-sev LAE, mild TR;   pt also was seen by another cardiology in [place] -- Dr Shella Maxim note in care everywhere 11-29-2022,  TEE 12-03-2022 , ef 60-65%,  mild-mod MR RVSP normal, normal LAE   Pulmonary HTN (HCC)    Symptomatic anemia    (01-24-2023  pt stated symptoms are chest pain, intermittant heart flutters, sob, weakness, fatigue)  admission in epic 11-06-2022  --- SOB / CP/  vaginal bleeding /  Hg2.6;   transfused 3units PRBC and 2 iron infusions ;  also referred to cardiology due to echo w/ moderate to severe MR unspecified pulm HTN   Uterine fibroid     Past Surgical History:  Procedure Laterality Date   DILATION AND CURETTAGE OF UTERUS  12/2016   EAB done in [place]   TEE WITHOUT CARDIOVERSION  12/03/2022   [place] , [place] by dr Shella Maxim    MEDICATIONS:  acetaminophen (TYLENOL) 325 MG tablet   Multiple Vitamins-Minerals (MULTIVITAMIN WOMEN PO)   norethindrone (AYGESTIN) 5 MG tablet   OVER THE COUNTER MEDICATION    acetaminophen (TYLENOL) tablet 650 mg   diphenhydrAMINE (BENADRYL) capsule 25 mg   Marcille Blanco [facility] Surgical Short Stay/Anesthesiology [facility] Phone [phone] 03/27/2023 9:55 AM

## 2023-03-31 ENCOUNTER — Other Ambulatory Visit: Payer: Self-pay | Admitting: Obstetrics and Gynecology

## 2023-03-31 NOTE — H&P (Signed)
 Obstetrics & Gynecology Surgical H&P   Date: 03/31/2023   [facility]  Reason for Admission: scheduled hysterectomy for bleeding, fibroids  History of Present Illness: [name] is a 38 y.o. G2P1011 (Patient's last menstrual period was 03/19/2023 (approximate).), with the above chief complaint. Her past medical history is significant for MV regurg, ?cardiomegaly, possible HTN, fibroids, recent Hgb of 2.6, BMI 30s, tobacco abuse.    Patient presented to [facility] ED on 10/30 with s/s of anemia, no bleeding. Hgb 2.6 and patient subsequently transfused. U/s showed multiple fibroids, including submucosal ones with overall uterine size 13 x 7.5 x 8.5cm. She was subsequently transfused and discharged to home. GYN consulted and recommended patient start combined OCPs and follow up as an outpatient. She also received an echo for chest s/s which showed MVR; she was discharged on 11/1.   She saw me for 11/6 new patient visit and had negative pap and embx (secretory endo) and options considered by her.   Patient seen by hematology who started her on IV iron, and she was seen by cardiology and echo findings felt to likely be from her anemia. She was started on lasix 2x/week and hysterectomy also recommended by cardiology   Patient, also, eventually amenable to starting outpatient progestins, and is prescribed aygestin 5mg  bid.   Patient last seen by [facility] 01/20/23 and recommended she continue on the aygestin until her surgery. Her case orginally scheduled for 1/22 but rescheduled to March for various reasons.   She was last seen by Dr. Servando Salina (cardiology) on 3/6. Repeat echo ordered and EKG before surgery recommended. EKG not done yet and 3/14 echo and recommended, by Dr. Servando Salina, to stop her K and lasix due to improvement of her MR which is now mild to moderate.   Patient seen by anesthesia on 3/19 and labs ordered and Hgb 12.7 with also normal mg and cmp.     ROS: A 12-point review of systems was performed and negative, except as stated in the above HPI.  OBGYN History: As per HPI. OB History  Gravida Para Term Preterm AB Living  2 1 1  0 1 1  SAB IAB Ectopic Multiple Live Births  0 1 0 0 1    # Outcome Date GA Lbr Len/2nd Weight Sex Type Anes PTL Lv  2 IAB 2019 [redacted]w[redacted]d         1 Term 09/03/15 [redacted]w[redacted]d 11:48 / 01:16 7 lb 14.8 oz (3.595 kg) M Vag-Spont EPI  LIV     Past Medical History: Past Medical History:  Diagnosis Date   Abnormal uterine bleeding (AUB)    History of abnormal cervical Pap smear    since 2000   History of chlamydia infection 07/2010   treated. hasn't been with that partner since.    History of gestational diabetes    History of gonorrhea    treated in July 2012. hasn't been with that partner since.    History of pregnancy induced hypertension    Iron deficiency anemia due to chronic blood loss    referred by hematology--- dr Geanie Berlin---   IV iron infusion,  [id number];  12-14-2022;   01-21-2023;  01-28-2023   Mitral regurgitation    Moderate to severe mitral regurgitation 11/06/2022   evalulated by cardiologoy-- dr Kirtland Bouchard. tobb note in epic 12-24-2022;  per echo 11-07-2022 ef 50-55%,  RVSP 63.9,  mod-sev LAE, mild TR;   pt also was seen by another cardiology in [place] --  Dr Shella Maxim note in care everywhere 11-29-2022,  TEE 12-03-2022 , ef 60-65%,  mild-mod MR RVSP normal, normal LAE   Pulmonary HTN (HCC)    Symptomatic anemia    (01-24-2023  pt stated symptoms are chest pain, intermittant heart flutters, sob, weakness, fatigue)  admission in epic 11-06-2022  --- SOB / CP/  vaginal bleeding /  Hg2.6;   transfused 3units PRBC and 2 iron infusions ;  also referred to cardiology due to echo w/ moderate to severe MR unspecified pulm HTN   Uterine fibroid     Past Surgical History: Past Surgical History:  Procedure Laterality Date   DILATION AND CURETTAGE OF UTERUS  12/2016   EAB done in [place]   TEE WITHOUT  CARDIOVERSION  12/03/2022   [place] , [place] by dr Shella Maxim    Family History:  Family History  Problem Relation Age of Onset   Hypertension Mother    Miscarriages / Stillbirths Sister    Hypertension Sister    Stroke Maternal Grandmother    Diabetes Maternal Grandmother     Social History:  Social History   Socioeconomic History   Marital status: Single    Spouse name: Not on file   Number of children: Not on file   Years of education: Not on file   Highest education level: Not on file  Occupational History   Not on file  Tobacco Use   Smoking status: Former    Types: Cigarettes   Smokeless tobacco: Never   Tobacco comments:    01-24-2023  pt stated quit smoking 10/ 2024,  started age 3  Vaping Use   Vaping status: Former   Devices: quit  2023  Substance and Sexual Activity   Alcohol use: No   Drug use: Not Currently    Types: Marijuana    Comment: 01-24-2023  per pt last smoked marijuana 10/ 2024   Sexual activity: Not on file  Other Topics Concern   Not on file  Social History Narrative   Not on file   Social Drivers of Health   Financial Resource Strain: Not on file  Food Insecurity: Food Insecurity Present (11/13/2022)   Hunger Vital Sign    Worried About Running Out of Food in the Last Year: Sometimes true    Ran Out of Food in the Last Year: Sometimes true  Transportation Needs: No Transportation Needs (11/13/2022)   PRAPARE - Administrator, Civil Service (Medical): No    Lack of Transportation (Non-Medical): No  Physical Activity: Not on file  Stress: Not on file  Social Connections: Not on file  Intimate Partner Violence: Not At Risk (11/08/2022)   Humiliation, Afraid, Rape, and Kick questionnaire    Fear of Current or Ex-Partner: No    Emotionally Abused: No    Physically Abused: No    Sexually Abused: No    Allergy: No Known Allergies  Current Outpatient Medications: Current Outpatient Medications  Medication Sig Dispense  Refill   acetaminophen (TYLENOL) 325 MG tablet Take 2 tablets (650 mg total) by mouth every 6 (six) hours as needed for mild pain (pain score 1-3) (or Fever >/= 101). (Patient not taking: Reported on 03/25/2023) 20 tablet 0   Multiple Vitamins-Minerals (MULTIVITAMIN WOMEN PO) Take 1 capsule by mouth daily.     norethindrone (AYGESTIN) 5 MG tablet Take 1 tablet (5 mg total) by mouth in the morning, at noon, and at bedtime. (Patient not taking: Reported on 03/25/2023)  OVER THE COUNTER MEDICATION Take 2 tablets by mouth daily. [employer] Iron Blood Builder     Current Facility-Administered Medications  Medication Dose Route Frequency Provider Last Rate Last Admin   acetaminophen (TYLENOL) tablet 650 mg  650 mg Oral Once Melven Sartorius, MD       diphenhydrAMINE (BENADRYL) capsule 25 mg  25 mg Oral Once Melven Sartorius, MD         Physical Exam: From 3/6 VS:  BP 110/74 (BP Location: Right Arm, Patient Position: Sitting, Cuff Size: Large)   Pulse 67   Ht 5\' 6"  (1.676 m)   Wt 248 lb 3.2 oz (112.6 kg)   SpO2 98%   BMI 40.06 kg/m         Wt Readings from Last 3 Encounters:  03/13/23 248 lb 3.2 oz (112.6 kg)  01/28/23 232 lb 9.6 oz (105.5 kg)  01/21/23 225 lb 9.6 oz (102.3 kg)      GEN: Well nourished, well developed in no acute distress HEENT: Normal NECK: No JVD; No carotid bruits LYMPHATICS: No lymphadenopathy CARDIAC: S1S2 noted,RRR, no murmurs, rubs, gallops RESPIRATORY:  Clear to auscultation without rales, wheezing or rhonchi  ABDOMEN: Soft, non-tender, non-distended, +bowel sounds, no guarding. EXTREMITIES: No edema, No cyanosis, no clubbing MUSCULOSKELETAL:  No deformity  SKIN: Warm and dry NEUROLOGIC:  Alert and oriented x 3, non-focal   Laboratory: Recent Labs  Lab 03/26/23 0942  WBC 3.9*  HGB 12.7  HCT 38.7  PLT 242   Recent Labs  Lab 03/26/23 0942  NA 137  K 3.9  CL 103  CO2 27  BUN 10  CREATININE 0.76  CALCIUM 9.3  PROT 7.5  BILITOT 0.5  ALKPHOS  65  ALT 28  AST 32  GLUCOSE 92   No results for input(s): "APTT", "INR", "PTT" in the last 168 hours.  Invalid input(s): "DRHAPTT" Recent Labs  Lab 03/26/23 0942  ABORH A POS    Imaging:  Narrative & Impression  CLINICAL DATA:  38 year old female with cough, chest pain, palpitations. Dysfunctional uterine bleeding. LMP 10/19/2022.   EXAM: TRANSABDOMINAL AND TRANSVAGINAL ULTRASOUND OF PELVIS   TECHNIQUE: Both transabdominal and transvaginal ultrasound examinations of the pelvis were performed. Transabdominal technique was performed for global imaging of the pelvis including uterus, ovaries, adnexal regions, and pelvic cul-de-sac. It was necessary to proceed with endovaginal exam following the transabdominal exam to visualize the ovaries.   COMPARISON:  Ob ultrasound 10/24/2016.   FINDINGS: Uterus   Measurements: 12.9 x 7.4 x 9.4 cm = volume: 468 mL. Multiple uterine fibroids with hypervascularity (image 97) ranging from 3.0 to 7.3 cm diameter (images 64 and 65. These generally appear to be intramural, and there is distortion of the endometrial contour (image 22) suggesting submucosal location of some lesions.   Endometrium   Thickness: 5 mm (image 122). Partially obscured by fibroid disease. No discrete endometrial lesion.   Right ovary   Only identified transabdominally. Measurements: 2.8 x 1.9 x 2.9 cm = volume: 8 mL. Normal appearance/no adnexal mass.   Left ovary   Only identified transabdominally. Measurements: 3.1 x 2.5 x 2.5 = volume: 10 mL. Normal appearance. Normal follicle (image 53 no follow-up imaging recommended), no adnexal mass.   Other findings   No free fluid.   IMPRESSION: 1. Enlarged fibroid uterus. 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 2. Endometrium mildly distorted by fibroids, otherwise negative. 3. Normal ovaries.     Electronically Signed  By: Odessa Fleming M.D.   On:  11/06/2022 16:39      ECHOCARDIOGRAM REPORT       Patient Name:   Darlene Hayes Date of Exam: 03/21/2023  Medical Rec #:  [id number]          Height:       66.0 in  Accession #:    [id number]         Weight:       248.2 lb  Date of Birth:  1985-02-29          BSA:          2.192 m  Patient Age:    37 years           BP:           108/71 mmHg  Patient Gender: F                  HR:           96 bpm.  Exam Location:  Outpatient   Procedure: 2D Echo, Cardiac Doppler and Color Doppler (Both Spectral and  Color            Flow Doppler were utilized during procedure).   Indications:    Mitral regurgitation    History:        Patient has prior history of Echocardiogram examinations,  most                 recent 11/07/2022.    Sonographer:    Harriette Bouillon RDCS  Referring Phys: Melven Sartorius   IMPRESSIONS     1. Left ventricular ejection fraction, by estimation, is 50 to 55%. The  left ventricle has low normal function. The left ventricle demonstrates  global hypokinesis. Left ventricular diastolic parameters are  indeterminate.   2. Right ventricular systolic function is normal. The right ventricular  size is normal.   3. Left atrial size was mildly dilated.   4. The mitral valve is abnormal. Mild mitral valve regurgitation.   5. The aortic valve is tricuspid. Aortic valve regurgitation is not  visualized.   Comparison(s): Changes from prior study are noted. 11/07/2022: LVEF  50-55%, moderate to severe MR.   FINDINGS   Left Ventricle: Left ventricular ejection fraction, by estimation, is 50  to 55%. The left ventricle has low normal function. The left ventricle  demonstrates global hypokinesis. The left ventricular internal cavity size  was normal in size. There is no  left ventricular hypertrophy. Left ventricular diastolic parameters are  indeterminate.   Right Ventricle: The right ventricular size is normal. No increase in  right ventricular wall thickness.  Right ventricular systolic function is  normal.   Left Atrium: Left atrial size was mildly dilated.   Right Atrium: Right atrial size was normal in size.   Pericardium: There is no evidence of pericardial effusion.   Mitral Valve: The mitral valve is abnormal. There is mild thickening of  the anterior mitral valve leaflet(s). There is mild calcification of the  anterior mitral valve leaflet(s). Mild mitral valve regurgitation.   Tricuspid Valve: The tricuspid valve is normal in structure. Tricuspid  valve regurgitation is not demonstrated.   Aortic Valve: The aortic valve is tricuspid. Aortic valve regurgitation is  not visualized.   Pulmonic Valve: The pulmonic valve was normal in structure. Pulmonic valve  regurgitation is not visualized.   Aorta: The aortic root and ascending aorta are structurally normal, with  no evidence  of dilitation.   IAS/Shunts: No atrial level shunt detected by color flow Doppler.     LEFT VENTRICLE  PLAX 2D  LVIDd:         4.80 cm  LVIDs:         3.10 cm  LV PW:         0.90 cm  LV IVS:        0.90 cm  LVOT diam:     2.00 cm  LV SV:         71  LV SV Index:   33  LVOT Area:     3.14 cm    LV Volumes (MOD)  LV vol d, MOD A2C: 71.1 ml  LV vol d, MOD A4C: 85.7 ml  LV vol s, MOD A2C: 32.5 ml  LV vol s, MOD A4C: 41.7 ml  LV SV MOD A2C:     38.6 ml  LV SV MOD A4C:     85.7 ml  LV SV MOD BP:      35.3 ml   RIGHT VENTRICLE            IVC  RV S prime:     9.57 cm/s  IVC diam: 1.90 cm  TAPSE (M-mode): 1.1 cm   LEFT ATRIUM             Index        RIGHT ATRIUM          Index  LA diam:        4.80 cm 2.19 cm/m   RA Area:     8.64 cm  LA Vol (A2C):   34.3 ml 15.65 ml/m  RA Volume:   16.00 ml 7.30 ml/m  LA Vol (A4C):   40.9 ml 18.66 ml/m  LA Biplane Vol: 39.9 ml 18.20 ml/m   AORTIC VALVE  LVOT Vmax:   128.00 cm/s  LVOT Vmean:  86.000 cm/s  LVOT VTI:    0.227 m    AORTA  Ao Root diam: 2.30 cm  Ao Asc diam:  2.20 cm     SHUNTS   Systemic VTI:  0.23 m  Systemic Diam: 2.00 cm   Zoila Shutter MD  Electronically signed by Zoila Shutter MD  Signature Date/Time: 03/21/2023/3:40:46 PM   Assessment: patient stable  Plan: Plan for total laparoscopic hysterectomy, bilateral salpingectomy, cystoscopy  Cornelia Copa MD Attending [facility] (Faculty Practice)

## 2023-03-31 NOTE — H&P (View-Only) (Signed)
 Obstetrics & Gynecology Surgical H&P   Date: 03/31/2023   [facility]  Reason for Admission: scheduled hysterectomy for bleeding, fibroids  History of Present Illness: [name] is a 38 y.o. G2P1011 (Patient's last menstrual period was 03/19/2023 (approximate).), with the above chief complaint. Her past medical history is significant for MV regurg, ?cardiomegaly, possible HTN, fibroids, recent Hgb of 2.6, BMI 30s, tobacco abuse.    Patient presented to [facility] ED on 10/30 with s/s of anemia, no bleeding. Hgb 2.6 and patient subsequently transfused. U/s showed multiple fibroids, including submucosal ones with overall uterine size 13 x 7.5 x 8.5cm. She was subsequently transfused and discharged to home. GYN consulted and recommended patient start combined OCPs and follow up as an outpatient. She also received an echo for chest s/s which showed MVR; she was discharged on 11/1.   She saw me for 11/6 new patient visit and had negative pap and embx (secretory endo) and options considered by her.   Patient seen by hematology who started her on IV iron, and she was seen by cardiology and echo findings felt to likely be from her anemia. She was started on lasix 2x/week and hysterectomy also recommended by cardiology   Patient, also, eventually amenable to starting outpatient progestins, and is prescribed aygestin 5mg  bid.   Patient last seen by [facility] 01/20/23 and recommended she continue on the aygestin until her surgery. Her case orginally scheduled for 1/22 but rescheduled to March for various reasons.   She was last seen by Dr. Servando Salina (cardiology) on 3/6. Repeat echo ordered and EKG before surgery recommended. EKG not done yet and 3/14 echo and recommended, by Dr. Servando Salina, to stop her K and lasix due to improvement of her MR which is now mild to moderate.   Patient seen by anesthesia on 3/19 and labs ordered and Hgb 12.7 with also normal mg and cmp.     ROS: A 12-point review of systems was performed and negative, except as stated in the above HPI.  OBGYN History: As per HPI. OB History  Gravida Para Term Preterm AB Living  2 1 1  0 1 1  SAB IAB Ectopic Multiple Live Births  0 1 0 0 1    # Outcome Date GA Lbr Len/2nd Weight Sex Type Anes PTL Lv  2 IAB 2019 [redacted]w[redacted]d         1 Term 09/03/15 [redacted]w[redacted]d 11:48 / 01:16 7 lb 14.8 oz (3.595 kg) M Vag-Spont EPI  LIV     Past Medical History: Past Medical History:  Diagnosis Date   Abnormal uterine bleeding (AUB)    History of abnormal cervical Pap smear    since 2000   History of chlamydia infection 07/2010   treated. hasn't been with that partner since.    History of gestational diabetes    History of gonorrhea    treated in July 2012. hasn't been with that partner since.    History of pregnancy induced hypertension    Iron deficiency anemia due to chronic blood loss    referred by hematology--- dr Geanie Berlin---   IV iron infusion,  [id number];  12-14-2022;   01-21-2023;  01-28-2023   Mitral regurgitation    Moderate to severe mitral regurgitation 11/06/2022   evalulated by cardiologoy-- dr Kirtland Bouchard. tobb note in epic 12-24-2022;  per echo 11-07-2022 ef 50-55%,  RVSP 63.9,  mod-sev LAE, mild TR;   pt also was seen by another cardiology in [place] --  Dr Shella Maxim note in care everywhere 11-29-2022,  TEE 12-03-2022 , ef 60-65%,  mild-mod MR RVSP normal, normal LAE   Pulmonary HTN (HCC)    Symptomatic anemia    (01-24-2023  pt stated symptoms are chest pain, intermittant heart flutters, sob, weakness, fatigue)  admission in epic 11-06-2022  --- SOB / CP/  vaginal bleeding /  Hg2.6;   transfused 3units PRBC and 2 iron infusions ;  also referred to cardiology due to echo w/ moderate to severe MR unspecified pulm HTN   Uterine fibroid     Past Surgical History: Past Surgical History:  Procedure Laterality Date   DILATION AND CURETTAGE OF UTERUS  12/2016   EAB done in [place]   TEE WITHOUT  CARDIOVERSION  12/03/2022   [place] , [place] by dr Shella Maxim    Family History:  Family History  Problem Relation Age of Onset   Hypertension Mother    Miscarriages / Stillbirths Sister    Hypertension Sister    Stroke Maternal Grandmother    Diabetes Maternal Grandmother     Social History:  Social History   Socioeconomic History   Marital status: Single    Spouse name: Not on file   Number of children: Not on file   Years of education: Not on file   Highest education level: Not on file  Occupational History   Not on file  Tobacco Use   Smoking status: Former    Types: Cigarettes   Smokeless tobacco: Never   Tobacco comments:    01-24-2023  pt stated quit smoking 10/ 2024,  started age 3  Vaping Use   Vaping status: Former   Devices: quit  2023  Substance and Sexual Activity   Alcohol use: No   Drug use: Not Currently    Types: Marijuana    Comment: 01-24-2023  per pt last smoked marijuana 10/ 2024   Sexual activity: Not on file  Other Topics Concern   Not on file  Social History Narrative   Not on file   Social Drivers of Health   Financial Resource Strain: Not on file  Food Insecurity: Food Insecurity Present (11/13/2022)   Hunger Vital Sign    Worried About Running Out of Food in the Last Year: Sometimes true    Ran Out of Food in the Last Year: Sometimes true  Transportation Needs: No Transportation Needs (11/13/2022)   PRAPARE - Administrator, Civil Service (Medical): No    Lack of Transportation (Non-Medical): No  Physical Activity: Not on file  Stress: Not on file  Social Connections: Not on file  Intimate Partner Violence: Not At Risk (11/08/2022)   Humiliation, Afraid, Rape, and Kick questionnaire    Fear of Current or Ex-Partner: No    Emotionally Abused: No    Physically Abused: No    Sexually Abused: No    Allergy: No Known Allergies  Current Outpatient Medications: Current Outpatient Medications  Medication Sig Dispense  Refill   acetaminophen (TYLENOL) 325 MG tablet Take 2 tablets (650 mg total) by mouth every 6 (six) hours as needed for mild pain (pain score 1-3) (or Fever >/= 101). (Patient not taking: Reported on 03/25/2023) 20 tablet 0   Multiple Vitamins-Minerals (MULTIVITAMIN WOMEN PO) Take 1 capsule by mouth daily.     norethindrone (AYGESTIN) 5 MG tablet Take 1 tablet (5 mg total) by mouth in the morning, at noon, and at bedtime. (Patient not taking: Reported on 03/25/2023)  OVER THE COUNTER MEDICATION Take 2 tablets by mouth daily. [employer] Iron Blood Builder     Current Facility-Administered Medications  Medication Dose Route Frequency Provider Last Rate Last Admin   acetaminophen (TYLENOL) tablet 650 mg  650 mg Oral Once Melven Sartorius, MD       diphenhydrAMINE (BENADRYL) capsule 25 mg  25 mg Oral Once Melven Sartorius, MD         Physical Exam: From 3/6 VS:  BP 110/74 (BP Location: Right Arm, Patient Position: Sitting, Cuff Size: Large)   Pulse 67   Ht 5\' 6"  (1.676 m)   Wt 248 lb 3.2 oz (112.6 kg)   SpO2 98%   BMI 40.06 kg/m         Wt Readings from Last 3 Encounters:  03/13/23 248 lb 3.2 oz (112.6 kg)  01/28/23 232 lb 9.6 oz (105.5 kg)  01/21/23 225 lb 9.6 oz (102.3 kg)      GEN: Well nourished, well developed in no acute distress HEENT: Normal NECK: No JVD; No carotid bruits LYMPHATICS: No lymphadenopathy CARDIAC: S1S2 noted,RRR, no murmurs, rubs, gallops RESPIRATORY:  Clear to auscultation without rales, wheezing or rhonchi  ABDOMEN: Soft, non-tender, non-distended, +bowel sounds, no guarding. EXTREMITIES: No edema, No cyanosis, no clubbing MUSCULOSKELETAL:  No deformity  SKIN: Warm and dry NEUROLOGIC:  Alert and oriented x 3, non-focal   Laboratory: Recent Labs  Lab 03/26/23 0942  WBC 3.9*  HGB 12.7  HCT 38.7  PLT 242   Recent Labs  Lab 03/26/23 0942  NA 137  K 3.9  CL 103  CO2 27  BUN 10  CREATININE 0.76  CALCIUM 9.3  PROT 7.5  BILITOT 0.5  ALKPHOS  65  ALT 28  AST 32  GLUCOSE 92   No results for input(s): "APTT", "INR", "PTT" in the last 168 hours.  Invalid input(s): "DRHAPTT" Recent Labs  Lab 03/26/23 0942  ABORH A POS    Imaging:  Narrative & Impression  CLINICAL DATA:  38 year old female with cough, chest pain, palpitations. Dysfunctional uterine bleeding. LMP 10/19/2022.   EXAM: TRANSABDOMINAL AND TRANSVAGINAL ULTRASOUND OF PELVIS   TECHNIQUE: Both transabdominal and transvaginal ultrasound examinations of the pelvis were performed. Transabdominal technique was performed for global imaging of the pelvis including uterus, ovaries, adnexal regions, and pelvic cul-de-sac. It was necessary to proceed with endovaginal exam following the transabdominal exam to visualize the ovaries.   COMPARISON:  Ob ultrasound 10/24/2016.   FINDINGS: Uterus   Measurements: 12.9 x 7.4 x 9.4 cm = volume: 468 mL. Multiple uterine fibroids with hypervascularity (image 97) ranging from 3.0 to 7.3 cm diameter (images 64 and 65. These generally appear to be intramural, and there is distortion of the endometrial contour (image 22) suggesting submucosal location of some lesions.   Endometrium   Thickness: 5 mm (image 122). Partially obscured by fibroid disease. No discrete endometrial lesion.   Right ovary   Only identified transabdominally. Measurements: 2.8 x 1.9 x 2.9 cm = volume: 8 mL. Normal appearance/no adnexal mass.   Left ovary   Only identified transabdominally. Measurements: 3.1 x 2.5 x 2.5 = volume: 10 mL. Normal appearance. Normal follicle (image 53 no follow-up imaging recommended), no adnexal mass.   Other findings   No free fluid.   IMPRESSION: 1. Enlarged fibroid uterus. 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 2. Endometrium mildly distorted by fibroids, otherwise negative. 3. Normal ovaries.     Electronically Signed  By: Odessa Fleming M.D.   On:  11/06/2022 16:39      ECHOCARDIOGRAM REPORT       Patient Name:   HENRITTA MUTZ Date of Exam: 03/21/2023  Medical Rec #:  [id number]          Height:       66.0 in  Accession #:    [id number]         Weight:       248.2 lb  Date of Birth:  1985-02-29          BSA:          2.192 m  Patient Age:    37 years           BP:           108/71 mmHg  Patient Gender: F                  HR:           96 bpm.  Exam Location:  Outpatient   Procedure: 2D Echo, Cardiac Doppler and Color Doppler (Both Spectral and  Color            Flow Doppler were utilized during procedure).   Indications:    Mitral regurgitation    History:        Patient has prior history of Echocardiogram examinations,  most                 recent 11/07/2022.    Sonographer:    Harriette Bouillon RDCS  Referring Phys: Melven Sartorius   IMPRESSIONS     1. Left ventricular ejection fraction, by estimation, is 50 to 55%. The  left ventricle has low normal function. The left ventricle demonstrates  global hypokinesis. Left ventricular diastolic parameters are  indeterminate.   2. Right ventricular systolic function is normal. The right ventricular  size is normal.   3. Left atrial size was mildly dilated.   4. The mitral valve is abnormal. Mild mitral valve regurgitation.   5. The aortic valve is tricuspid. Aortic valve regurgitation is not  visualized.   Comparison(s): Changes from prior study are noted. 11/07/2022: LVEF  50-55%, moderate to severe MR.   FINDINGS   Left Ventricle: Left ventricular ejection fraction, by estimation, is 50  to 55%. The left ventricle has low normal function. The left ventricle  demonstrates global hypokinesis. The left ventricular internal cavity size  was normal in size. There is no  left ventricular hypertrophy. Left ventricular diastolic parameters are  indeterminate.   Right Ventricle: The right ventricular size is normal. No increase in  right ventricular wall thickness.  Right ventricular systolic function is  normal.   Left Atrium: Left atrial size was mildly dilated.   Right Atrium: Right atrial size was normal in size.   Pericardium: There is no evidence of pericardial effusion.   Mitral Valve: The mitral valve is abnormal. There is mild thickening of  the anterior mitral valve leaflet(s). There is mild calcification of the  anterior mitral valve leaflet(s). Mild mitral valve regurgitation.   Tricuspid Valve: The tricuspid valve is normal in structure. Tricuspid  valve regurgitation is not demonstrated.   Aortic Valve: The aortic valve is tricuspid. Aortic valve regurgitation is  not visualized.   Pulmonic Valve: The pulmonic valve was normal in structure. Pulmonic valve  regurgitation is not visualized.   Aorta: The aortic root and ascending aorta are structurally normal, with  no evidence  of dilitation.   IAS/Shunts: No atrial level shunt detected by color flow Doppler.     LEFT VENTRICLE  PLAX 2D  LVIDd:         4.80 cm  LVIDs:         3.10 cm  LV PW:         0.90 cm  LV IVS:        0.90 cm  LVOT diam:     2.00 cm  LV SV:         71  LV SV Index:   33  LVOT Area:     3.14 cm    LV Volumes (MOD)  LV vol d, MOD A2C: 71.1 ml  LV vol d, MOD A4C: 85.7 ml  LV vol s, MOD A2C: 32.5 ml  LV vol s, MOD A4C: 41.7 ml  LV SV MOD A2C:     38.6 ml  LV SV MOD A4C:     85.7 ml  LV SV MOD BP:      35.3 ml   RIGHT VENTRICLE            IVC  RV S prime:     9.57 cm/s  IVC diam: 1.90 cm  TAPSE (M-mode): 1.1 cm   LEFT ATRIUM             Index        RIGHT ATRIUM          Index  LA diam:        4.80 cm 2.19 cm/m   RA Area:     8.64 cm  LA Vol (A2C):   34.3 ml 15.65 ml/m  RA Volume:   16.00 ml 7.30 ml/m  LA Vol (A4C):   40.9 ml 18.66 ml/m  LA Biplane Vol: 39.9 ml 18.20 ml/m   AORTIC VALVE  LVOT Vmax:   128.00 cm/s  LVOT Vmean:  86.000 cm/s  LVOT VTI:    0.227 m    AORTA  Ao Root diam: 2.30 cm  Ao Asc diam:  2.20 cm     SHUNTS   Systemic VTI:  0.23 m  Systemic Diam: 2.00 cm   Zoila Shutter MD  Electronically signed by Zoila Shutter MD  Signature Date/Time: 03/21/2023/3:40:46 PM   Assessment: patient stable  Plan: Plan for total laparoscopic hysterectomy, bilateral salpingectomy, cystoscopy  Cornelia Copa MD Attending [facility] (Faculty Practice)

## 2023-04-01 ENCOUNTER — Observation Stay (HOSPITAL_COMMUNITY)
Admission: RE | Admit: 2023-04-01 | Discharge: 2023-04-02 | Disposition: A | Discharge status: Home / Self Care | Payer: Medicaid Other | Attending: Obstetrics and Gynecology | Admitting: Obstetrics and Gynecology

## 2023-04-01 ENCOUNTER — Other Ambulatory Visit: Payer: Self-pay

## 2023-04-01 ENCOUNTER — Observation Stay (HOSPITAL_BASED_OUTPATIENT_CLINIC_OR_DEPARTMENT_OTHER): Payer: Self-pay | Admitting: Anesthesiology

## 2023-04-01 ENCOUNTER — Encounter (HOSPITAL_COMMUNITY)
Admission: RE | Disposition: A | Discharge status: Home / Self Care | Payer: Self-pay | Source: Home / Self Care | Attending: Obstetrics and Gynecology

## 2023-04-01 ENCOUNTER — Observation Stay (HOSPITAL_COMMUNITY): Payer: Self-pay | Admitting: Anesthesiology

## 2023-04-01 ENCOUNTER — Encounter (HOSPITAL_COMMUNITY): Payer: Self-pay | Admitting: Obstetrics and Gynecology

## 2023-04-01 DIAGNOSIS — D259 Leiomyoma of uterus, unspecified: Secondary | ICD-10-CM

## 2023-04-01 DIAGNOSIS — N939 Abnormal uterine and vaginal bleeding, unspecified: Secondary | ICD-10-CM | POA: Insufficient documentation

## 2023-04-01 DIAGNOSIS — D250 Submucous leiomyoma of uterus: Secondary | ICD-10-CM

## 2023-04-01 DIAGNOSIS — Z87891 Personal history of nicotine dependence: Secondary | ICD-10-CM | POA: Diagnosis not present

## 2023-04-01 DIAGNOSIS — I340 Nonrheumatic mitral (valve) insufficiency: Secondary | ICD-10-CM | POA: Diagnosis present

## 2023-04-01 DIAGNOSIS — N398 Other specified disorders of urinary system: Secondary | ICD-10-CM | POA: Diagnosis not present

## 2023-04-01 DIAGNOSIS — Z9889 Other specified postprocedural states: Secondary | ICD-10-CM

## 2023-04-01 DIAGNOSIS — Z01818 Encounter for other preprocedural examination: Principal | ICD-10-CM

## 2023-04-01 HISTORY — DX: Personal history of gestational diabetes: Z86.32

## 2023-04-01 HISTORY — DX: Personal history of other infectious and parasitic diseases: Z86.19

## 2023-04-01 HISTORY — PX: TOTAL LAPAROSCOPIC HYSTERECTOMY WITH SALPINGECTOMY: SHX6742

## 2023-04-01 HISTORY — PX: CYSTOSCOPY: SHX5120

## 2023-04-01 HISTORY — DX: Personal history of other diseases of the female genital tract: Z87.42

## 2023-04-01 HISTORY — DX: Leiomyoma of uterus, unspecified: D25.9

## 2023-04-01 HISTORY — DX: Anemia, unspecified: D64.9

## 2023-04-01 HISTORY — DX: Personal history of other complications of pregnancy, childbirth and the puerperium: Z87.59

## 2023-04-01 HISTORY — DX: Abnormal uterine and vaginal bleeding, unspecified: N93.9

## 2023-04-01 HISTORY — DX: Iron deficiency anemia secondary to blood loss (chronic): D50.0

## 2023-04-01 LAB — POCT PREGNANCY, URINE: Preg Test, Ur: NEGATIVE

## 2023-04-01 SURGERY — HYSTERECTOMY, TOTAL, LAPAROSCOPIC, WITH SALPINGECTOMY
Anesthesia: General | Site: Bladder

## 2023-04-01 MED ORDER — 0.9 % SODIUM CHLORIDE (POUR BTL) OPTIME
TOPICAL | Status: DC | PRN
  Administered 2023-04-01: 1000 mL

## 2023-04-01 MED ORDER — HEMOSTATIC AGENTS (NO CHARGE) OPTIME
TOPICAL | Status: DC | PRN
  Administered 2023-04-01: 1 via TOPICAL

## 2023-04-01 MED ORDER — ONDANSETRON HCL 4 MG/2ML IJ SOLN
INTRAMUSCULAR | Status: DC | PRN
  Administered 2023-04-01: 4 mg via INTRAVENOUS

## 2023-04-01 MED ORDER — CHLORHEXIDINE GLUCONATE 0.12 % MT SOLN
15.0000 mL | Freq: Once | OROMUCOSAL | Status: AC
  Administered 2023-04-01: 15 mL via OROMUCOSAL
  Filled 2023-04-01: qty 15

## 2023-04-01 MED ORDER — ACETAMINOPHEN 10 MG/ML IV SOLN
INTRAVENOUS | Status: AC
  Filled 2023-04-01: qty 100

## 2023-04-01 MED ORDER — PROPOFOL 10 MG/ML IV BOLUS
INTRAVENOUS | Status: AC
  Filled 2023-04-01: qty 20

## 2023-04-01 MED ORDER — POLYETHYLENE GLYCOL 3350 17 G PO PACK
17.0000 g | PACK | Freq: Every day | ORAL | Status: DC
  Administered 2023-04-02: 17 g via ORAL
  Filled 2023-04-01: qty 1

## 2023-04-01 MED ORDER — HYDROMORPHONE HCL 1 MG/ML IJ SOLN
0.2500 mg | INTRAMUSCULAR | Status: DC | PRN
  Administered 2023-04-01: 0.5 mg via INTRAVENOUS

## 2023-04-01 MED ORDER — ORAL CARE MOUTH RINSE: 15.0000 mL | Freq: Once | OROMUCOSAL | Status: AC

## 2023-04-01 MED ORDER — ONDANSETRON HCL 4 MG/2ML IJ SOLN
4.0000 mg | Freq: Four times a day (QID) | INTRAMUSCULAR | Status: DC | PRN
  Administered 2023-04-01: 4 mg via INTRAVENOUS
  Filled 2023-04-01: qty 2

## 2023-04-01 MED ORDER — GABAPENTIN 300 MG PO CAPS
300.0000 mg | ORAL_CAPSULE | Freq: Two times a day (BID) | ORAL | Status: DC
  Administered 2023-04-01 – 2023-04-02 (×2): 300 mg via ORAL
  Filled 2023-04-01 (×2): qty 1

## 2023-04-01 MED ORDER — SUGAMMADEX SODIUM 200 MG/2ML IV SOLN
INTRAVENOUS | Status: DC | PRN
  Administered 2023-04-01: 219.6 mg via INTRAVENOUS

## 2023-04-01 MED ORDER — PHENYLEPHRINE 80 MCG/ML (10ML) SYRINGE FOR IV PUSH (FOR BLOOD PRESSURE SUPPORT)
PREFILLED_SYRINGE | INTRAVENOUS | Status: DC | PRN
  Administered 2023-04-01: 160 ug via INTRAVENOUS
  Administered 2023-04-01: 80 ug via INTRAVENOUS

## 2023-04-01 MED ORDER — OXYCODONE HCL 5 MG PO TABS: 5.0000 mg | ORAL_TABLET | Freq: Once | ORAL | Status: DC | PRN

## 2023-04-01 MED ORDER — SIMETHICONE 80 MG PO CHEW
80.0000 mg | CHEWABLE_TABLET | Freq: Three times a day (TID) | ORAL | Status: DC
Start: 2023-04-01 — End: 2023-04-02
  Administered 2023-04-01 – 2023-04-02 (×3): 80 mg via ORAL
  Filled 2023-04-01 (×3): qty 1

## 2023-04-01 MED ORDER — DEXAMETHASONE SODIUM PHOSPHATE 10 MG/ML IJ SOLN
INTRAMUSCULAR | Status: DC | PRN
  Administered 2023-04-01: 5 mg via INTRAVENOUS

## 2023-04-01 MED ORDER — HYDROMORPHONE HCL 1 MG/ML IJ SOLN
1.0000 mg | INTRAMUSCULAR | Status: DC | PRN
  Administered 2023-04-01: 1 mg via INTRAVENOUS
  Filled 2023-04-01: qty 1

## 2023-04-01 MED ORDER — ROCURONIUM BROMIDE 10 MG/ML (PF) SYRINGE
PREFILLED_SYRINGE | INTRAVENOUS | Status: DC | PRN
  Administered 2023-04-01: 100 mg via INTRAVENOUS

## 2023-04-01 MED ORDER — LACTATED RINGERS IV SOLN: INTRAVENOUS | Status: DC

## 2023-04-01 MED ORDER — BUPIVACAINE HCL 0.5 % IJ SOLN
INTRAMUSCULAR | Status: DC | PRN
  Administered 2023-04-01: 30 mL

## 2023-04-01 MED ORDER — POVIDONE-IODINE 10 % EX SWAB
2.0000 | Freq: Once | CUTANEOUS | Status: AC
  Administered 2023-04-01: 2 via TOPICAL

## 2023-04-01 MED ORDER — CEFAZOLIN SODIUM-DEXTROSE 2-4 GM/100ML-% IV SOLN
2.0000 g | INTRAVENOUS | Status: AC
  Administered 2023-04-01: 2 g via INTRAVENOUS
  Filled 2023-04-01: qty 100

## 2023-04-01 MED ORDER — FLUORESCEIN SODIUM 10 % IV SOLN
INTRAVENOUS | Status: DC | PRN
  Administered 2023-04-01: .25 mL via INTRAVENOUS

## 2023-04-01 MED ORDER — DEXMEDETOMIDINE HCL IN NACL 80 MCG/20ML IV SOLN
INTRAVENOUS | Status: DC | PRN
  Administered 2023-04-01: 8 ug via INTRAVENOUS
  Administered 2023-04-01: 4 ug via INTRAVENOUS
  Administered 2023-04-01: 8 ug via INTRAVENOUS

## 2023-04-01 MED ORDER — FENTANYL CITRATE (PF) 250 MCG/5ML IJ SOLN
INTRAMUSCULAR | Status: DC | PRN
Start: 2023-04-01 — End: 2023-04-01
  Administered 2023-04-01: 50 ug via INTRAVENOUS
  Administered 2023-04-01: 100 ug via INTRAVENOUS
  Administered 2023-04-01 (×2): 50 ug via INTRAVENOUS

## 2023-04-01 MED ORDER — SODIUM CHLORIDE 0.9 % IR SOLN
Status: DC | PRN
  Administered 2023-04-01: 1000 mL

## 2023-04-01 MED ORDER — PROPOFOL 1000 MG/100ML IV EMUL
INTRAVENOUS | Status: AC
  Filled 2023-04-01: qty 100

## 2023-04-01 MED ORDER — LACTATED RINGERS IV SOLN: INTRAVENOUS | Status: AC

## 2023-04-01 MED ORDER — ACETAMINOPHEN 10 MG/ML IV SOLN
INTRAVENOUS | Status: DC | PRN
  Administered 2023-04-01: 1000 mg via INTRAVENOUS

## 2023-04-01 MED ORDER — MIDAZOLAM HCL 2 MG/2ML IJ SOLN
INTRAMUSCULAR | Status: DC | PRN
  Administered 2023-04-01: 2 mg via INTRAVENOUS

## 2023-04-01 MED ORDER — HYDROMORPHONE HCL 1 MG/ML IJ SOLN
INTRAMUSCULAR | Status: AC
  Filled 2023-04-01: qty 1

## 2023-04-01 MED ORDER — ONDANSETRON HCL 4 MG PO TABS: 4.0000 mg | ORAL_TABLET | Freq: Four times a day (QID) | ORAL | Status: DC | PRN

## 2023-04-01 MED ORDER — FLUORESCEIN SODIUM 10 % IV SOLN
INTRAVENOUS | Status: AC
  Filled 2023-04-01: qty 5

## 2023-04-01 MED ORDER — LACTATED RINGERS IV BOLUS
500.0000 mL | Freq: Once | INTRAVENOUS | Status: AC
  Administered 2023-04-01: 500 mL via INTRAVENOUS

## 2023-04-01 MED ORDER — FAMOTIDINE 20 MG PO TABS: 20.0000 mg | ORAL_TABLET | Freq: Two times a day (BID) | ORAL | Status: DC | PRN

## 2023-04-01 MED ORDER — LIDOCAINE 2% (20 MG/ML) 5 ML SYRINGE
INTRAMUSCULAR | Status: DC | PRN
  Administered 2023-04-01: 100 mg via INTRAVENOUS

## 2023-04-01 MED ORDER — OXYCODONE HCL 5 MG/5ML PO SOLN: 5.0000 mg | Freq: Once | ORAL | Status: DC | PRN

## 2023-04-01 MED ORDER — MENTHOL 3 MG MT LOZG
1.0000 | LOZENGE | OROMUCOSAL | Status: DC | PRN
  Administered 2023-04-01: 3 mg via ORAL
  Filled 2023-04-01: qty 9

## 2023-04-01 MED ORDER — BUPIVACAINE HCL (PF) 0.5 % IJ SOLN
INTRAMUSCULAR | Status: AC
Start: 2023-04-01 — End: ?
  Filled 2023-04-01: qty 30

## 2023-04-01 MED ORDER — AMISULPRIDE (ANTIEMETIC) 5 MG/2ML IV SOLN
INTRAVENOUS | Status: AC
  Filled 2023-04-01: qty 4

## 2023-04-01 MED ORDER — PROPOFOL 500 MG/50ML IV EMUL
INTRAVENOUS | Status: DC | PRN
  Administered 2023-04-01: 25 ug/kg/min via INTRAVENOUS

## 2023-04-01 MED ORDER — MIDAZOLAM HCL 2 MG/2ML IJ SOLN
INTRAMUSCULAR | Status: AC
  Filled 2023-04-01: qty 2

## 2023-04-01 MED ORDER — PROPOFOL 10 MG/ML IV BOLUS
INTRAVENOUS | Status: DC | PRN
  Administered 2023-04-01: 30 mg via INTRAVENOUS
  Administered 2023-04-01: 180 mg via INTRAVENOUS

## 2023-04-01 MED ORDER — SODIUM CHLORIDE 0.9 % IV SOLN: 12.5000 mg | INTRAVENOUS | Status: DC | PRN

## 2023-04-01 MED ORDER — IBUPROFEN 600 MG PO TABS
600.0000 mg | ORAL_TABLET | Freq: Four times a day (QID) | ORAL | Status: DC
  Administered 2023-04-01 – 2023-04-02 (×3): 600 mg via ORAL
  Filled 2023-04-01 (×3): qty 1

## 2023-04-01 MED ORDER — PHENAZOPYRIDINE HCL 200 MG PO TABS
200.0000 mg | ORAL_TABLET | Freq: Three times a day (TID) | ORAL | Status: DC
  Administered 2023-04-01 – 2023-04-02 (×2): 200 mg via ORAL
  Filled 2023-04-01 (×4): qty 1

## 2023-04-01 MED ORDER — AMISULPRIDE (ANTIEMETIC) 5 MG/2ML IV SOLN
10.0000 mg | Freq: Once | INTRAVENOUS | Status: AC | PRN
  Administered 2023-04-01: 10 mg via INTRAVENOUS

## 2023-04-01 MED ORDER — KETOROLAC TROMETHAMINE 15 MG/ML IJ SOLN
INTRAMUSCULAR | Status: DC | PRN
  Administered 2023-04-01: 15 mg via INTRAVENOUS

## 2023-04-01 MED ORDER — OXYCODONE HCL 5 MG PO TABS
5.0000 mg | ORAL_TABLET | ORAL | Status: DC | PRN
  Administered 2023-04-02: 5 mg via ORAL
  Filled 2023-04-01: qty 2

## 2023-04-01 MED ORDER — FENTANYL CITRATE (PF) 250 MCG/5ML IJ SOLN
INTRAMUSCULAR | Status: AC
  Filled 2023-04-01: qty 5

## 2023-04-01 MED ORDER — MEPERIDINE HCL 25 MG/ML IJ SOLN: 6.2500 mg | INTRAMUSCULAR | Status: DC | PRN

## 2023-04-01 SURGICAL SUPPLY — 50 items
APPLICATOR ARISTA FLEXITIP XL (MISCELLANEOUS) IMPLANT
BARRIER ADHS 3X4 INTERCEED (GAUZE/BANDAGES/DRESSINGS) IMPLANT
BLADE SURG 10 STRL SS (BLADE) IMPLANT
BLADE SURG 15 STRL LF DISP TIS (BLADE) ×2 IMPLANT
DEFOGGER SCOPE WARMER CLEARIFY (MISCELLANEOUS) ×2 IMPLANT
DERMABOND ADVANCED .7 DNX12 (GAUZE/BANDAGES/DRESSINGS) ×2 IMPLANT
DEVICE SUTURE ENDOST 10MM (ENDOMECHANICALS) IMPLANT
DRAPE SURG IRRIG POUCH 19X23 (DRAPES) ×2 IMPLANT
DURAPREP 26ML APPLICATOR (WOUND CARE) ×2 IMPLANT
GLOVE BIOGEL PI IND STRL 7.5 (GLOVE) ×4 IMPLANT
GLOVE SURG SS PI 7.0 STRL IVOR (GLOVE) ×4 IMPLANT
GOWN STRL REUS W/ TWL LRG LVL3 (GOWN DISPOSABLE) ×4 IMPLANT
GOWN STRL REUS W/ TWL XL LVL3 (GOWN DISPOSABLE) ×2 IMPLANT
HEMOSTAT ARISTA ABSORB 3G PWDR (HEMOSTASIS) IMPLANT
HIBICLENS CHG 4% 4OZ BTL (MISCELLANEOUS) ×2 IMPLANT
IRRIG SUCT STRYKERFLOW 2 WTIP (MISCELLANEOUS) ×2 IMPLANT
IRRIGATION SUCT STRKRFLW 2 WTP (MISCELLANEOUS) ×2 IMPLANT
KIT PINK PAD W/HEAD ARE REST (MISCELLANEOUS) ×2 IMPLANT
KIT PINK PAD W/HEAD ARM REST (MISCELLANEOUS) ×4 IMPLANT
KIT TURNOVER KIT B (KITS) ×2 IMPLANT
L-HOOK LAP DISP 36CM (ELECTROSURGICAL) IMPLANT
LHOOK LAP DISP 36CM (ELECTROSURGICAL) IMPLANT
LIGASURE VESSEL 5MM BLUNT TIP (ELECTROSURGICAL) IMPLANT
MANIFOLD NEPTUNE II (INSTRUMENTS) ×2 IMPLANT
MANIPULATOR VCARE LG CRV RETR (MISCELLANEOUS) IMPLANT
MANIPULATOR VCARE SML CRV RETR (MISCELLANEOUS) IMPLANT
MANIPULATOR VCARE STD CRV RETR (MISCELLANEOUS) IMPLANT
OCCLUDER COLPOPNEUMO (BALLOONS) ×2 IMPLANT
PACK LAPAROSCOPY BASIN (CUSTOM PROCEDURE TRAY) ×2 IMPLANT
PAD ARMBOARD POSITIONER FOAM (MISCELLANEOUS) ×4 IMPLANT
PAD OB MATERNITY 11 LF (PERSONAL CARE ITEMS) ×2 IMPLANT
PENCIL SMOKE EVACUATOR (MISCELLANEOUS) ×2 IMPLANT
POUCH LAPAROSCOPIC INSTRUMENT (MISCELLANEOUS) ×2 IMPLANT
SCISSORS LAP 5X35 DISP (ENDOMECHANICALS) IMPLANT
SET IRRIG Y TYPE TUR BLADDER L (SET/KITS/TRAYS/PACK) ×2 IMPLANT
SET TUBE SMOKE EVAC HIGH FLOW (TUBING) ×2 IMPLANT
SOL ELECTROSURG ANTI STICK (MISCELLANEOUS) ×2 IMPLANT
SOLUTION ELECTROSURG ANTI STCK (MISCELLANEOUS) IMPLANT
SUT ENDO VLOC 180-0-8IN (SUTURE) IMPLANT
SUT MNCRL AB 4-0 PS2 18 (SUTURE) IMPLANT
SUT VICRYL 0 UR6 27IN ABS (SUTURE) IMPLANT
SYR 10ML LL (SYRINGE) ×2 IMPLANT
SYR 50ML LL SCALE MARK (SYRINGE) ×2 IMPLANT
SYSTEM CARTER THOMASON II (TROCAR) IMPLANT
TOWEL GREEN STERILE FF (TOWEL DISPOSABLE) ×2 IMPLANT
TRAY FOLEY W/BAG SLVR 16FR ST (SET/KITS/TRAYS/PACK) ×2 IMPLANT
TROCAR 11X100 Z THREAD (TROCAR) IMPLANT
TROCAR ADV FIXATION 5X100MM (TROCAR) IMPLANT
TROCAR BALLN 12MMX100 BLUNT (TROCAR) IMPLANT
UNDERPAD 30X36 HEAVY ABSORB (UNDERPADS AND DIAPERS) ×2 IMPLANT

## 2023-04-01 NOTE — Brief Op Note (Signed)
 04/01/2023  10:40 AM  PATIENT:  Darlene Hayes  38 y.o. female  PRE-OPERATIVE DIAGNOSIS:  Abnormal uterine bleeding, Fibroids  POST-OPERATIVE DIAGNOSIS:  Abnormal uterine bleeding, Fibroids  PROCEDURE:  Procedure(s): TOTAL LAPAROSCOPIC HYSTERECTOMY WITH BILATERAL SALPINGECTOMY (Bilateral) CYSTOSCOPY (N/A)  SURGEON:  Surgeons and Role:    * [place] Bing, MD - Primary  ASSISTANTS:    Jerene Bears, MD - Assisting   ANESTHESIA:   local and general  EBL:  150 mL   IVF: crystalloid  BLOOD ADMINISTERED:none  DRAINS:  indwelling foley UOP    LOCAL MEDICATIONS USED:  MARCAINE     SPECIMEN:  cervix, uterus, bilateral tubes; 380gm total weight  DISPOSITION OF SPECIMEN:  PATHOLOGY  COUNTS:  YES  TOURNIQUET:  * No tourniquets in log *  DICTATION: .Note written in EPIC  PLAN OF CARE: Admit for overnight observation  PATIENT DISPOSITION:  PACU - hemodynamically stable.   Delay start of Pharmacological VTE agent (>24hrs) due to surgical blood loss or risk of bleeding: not applicable  Cornelia Copa MD Attending [facility] [employer] [facility] (Faculty Practice) 04/01/2023 Time: 1041am

## 2023-04-01 NOTE — Transfer of Care (Signed)
 Immediate Anesthesia Transfer of Care Note  Patient: Darlene Hayes  Procedure(s) Performed: TOTAL LAPAROSCOPIC HYSTERECTOMY WITH BILATERAL SALPINGECTOMY (Bilateral: Abdomen) CYSTOSCOPY (Bladder)  Patient Location: PACU  Anesthesia Type:General  Level of Consciousness: sedated, drowsy, and patient cooperative  Airway & Oxygen Therapy: Patient Spontanous Breathing and Patient connected to nasal cannula oxygen  Post-op Assessment: Report given to RN and Post -op Vital signs reviewed and stable  Post vital signs: Reviewed and stable  Last Vitals:  Vitals Value Taken Time  BP 128/68 04/01/23 1055  Temp    Pulse 93 04/01/23 1100  Resp 15 04/01/23 1100  SpO2 100 % 04/01/23 1100  Vitals shown include unfiled device data.  Last Pain:  Vitals:   04/01/23 0641  TempSrc:   PainSc: 0-No pain         Complications: No notable events documented.

## 2023-04-01 NOTE — Anesthesia Postprocedure Evaluation (Signed)
 Anesthesia Post Note  Patient: Darlene Hayes  Procedure(s) Performed: TOTAL LAPAROSCOPIC HYSTERECTOMY WITH BILATERAL SALPINGECTOMY (Bilateral: Abdomen) CYSTOSCOPY (Bladder)     Patient location during evaluation: PACU Anesthesia Type: General Level of consciousness: awake and alert Pain management: pain level controlled Vital Signs Assessment: post-procedure vital signs reviewed and stable Respiratory status: spontaneous breathing, nonlabored ventilation and respiratory function stable Cardiovascular status: blood pressure returned to baseline and stable Postop Assessment: no apparent nausea or vomiting Anesthetic complications: no   No notable events documented.  Last Vitals:  Vitals:   04/01/23 1115 04/01/23 1224  BP:  133/79  Pulse:  73  Resp:  16  Temp: 36.4 C 36.4 C  SpO2:  100%    Last Pain:  Vitals:   04/01/23 1224  TempSrc: Oral  PainSc: 8                  Lowella Curb

## 2023-04-01 NOTE — Interval H&P Note (Signed)
 History and Physical Interval Note:  04/01/2023 6:57 AM  Darlene Hayes  has presented today for surgery, with the diagnosis of Abnormal uterine bleeding Fibroids.  The various methods of treatment have been discussed with the patient and family. After consideration of risks, benefits and other options for treatment, the patient has consented to  Procedure(s) with comments: TOTAL LAPAROSCOPIC HYSTERECTOMY WITH BILATERAL SALPINGECTOMY (Bilateral) - Can you ask the OR to have in the room, but not opened, both a Rumi uterine manipulator with a plastic cup and v-care uterine manipulators CYSTOSCOPY (N/A) as a surgical intervention.  The patient's history has been reviewed, patient examined, no change in status, stable for surgery.  I have reviewed the patient's chart and labs.  Questions were answered to the patient's satisfaction.  Prior January EKG reviewed with Dr. Hyacinth Meeker and no need for repeat   [facility]

## 2023-04-01 NOTE — Anesthesia Procedure Notes (Signed)
 Procedure Name: Intubation Date/Time: 04/01/2023 7:44 AM  Performed by: Sharlene Dory, CRNAPre-anesthesia Checklist: Patient identified, Emergency Drugs available, Suction available and Patient being monitored Patient Re-evaluated:Patient Re-evaluated prior to induction Oxygen Delivery Method: Circle System Utilized Preoxygenation: Pre-oxygenation with 100% oxygen Induction Type: IV induction Ventilation: Mask ventilation without difficulty Laryngoscope Size: Mac and 3 Grade View: Grade I Tube type: Oral Tube size: 7.0 mm Number of attempts: 1 Airway Equipment and Method: Stylet and Oral airway Placement Confirmation: ETT inserted through vocal cords under direct vision, positive ETCO2 and breath sounds checked- equal and bilateral Secured at: 20 cm Tube secured with: Tape Dental Injury: Teeth and Oropharynx as per pre-operative assessment  Comments: Easy, atraumatic intubation. Lips, tongue, and teeth unchanged. Head and neck in midline position.

## 2023-04-01 NOTE — Op Note (Signed)
 Operative Note   04/01/2023  PRE-OP DIAGNOSIS *Abnormal uterine bleeding, Fibroids    POST-OP DIAGNOSIS *Same   SURGEON: Surgeons and Role:    * [place] Bing, MD - Primary  ASSISTANT:    Jerene Bears, MD - Assisting  An experienced assistant was required given the standard of surgical care given the complexity of the case.  This assistant was needed for exposure, dissection, suctioning, retraction, instrument exchange, and for overall help during the procedure.  PROCEDURE: Total laparoscopic hysterectomy, bilateral salpingectomy, cystoscopy; vaginal morcellation.   ANESTHESIA: General and local  ESTIMATED BLOOD LOSS:  DRAINS: indwelling foley UOP   TOTAL IV FLUIDS: crystalloid  VTE PROPHYLAXIS: SCDs to the bilateral lower extremities  ANTIBIOTICS: Two grams of Cefazolin were given. within 1 hour of skin incision  SPECIMENS: cervix, uterus, bilateral tubes  DISPOSITION: PACU - hemodynamically stable.  CONDITION: stable  COMPLICATIONS: None  FINDINGS: Normal EGBUS, vagina and cervix. On laparoscopy, approximately 14 week sized uterus noted that was top heavy at the fundus; no external distortions or fibroids noted and normal tubes and ovaries bilaterally. 380gm specimen.  On cystoscopy, normal bladder dome and +efflux from bilateral ureteral orifices of fluorescein.    DESCRIPTION OF PROCEDURE: After informed consent was obtained, the patient was taken to the operating room where anesthesia was obtained without difficulty. The patient was positioned in the dorsal lithotomy position in [place] stirrups and her arms were carefully tucked at her sides and the usual precautions were taken.  She was prepped and draped in normal sterile fashion.  Time-out was performed and a Foley catheter was placed into the bladder. A standard VCare uterine manipulator was then placed in the uterus without incident. Gloves were then changed, and after injection of local  anesthesia, the open technique was used to place an infraumbilical 12-mm baloon trocar under direct visualization. The laparoscope was introduced and CO2 gas was infused for pneumoperitoneum to a pressure of 15 mm Hg and the area below inspected for injury.  The patient was placed in Trendelenburg and the bowel was displaced up into the upper abdomen, and the right and left lateral 5-mm ports and an 11 mm suprapubic port were placed under direct visualization of the laparoscope, after injection of local anesthesia.    The bilateral mesolpinges were then cauterized with the Ligasure device and the tubes cauterized, transected and removed from the abdomen via the ports. Next, the round ligaments were divided on each side with cautery and the retroperitoneal space was opened bilaterally.  The ureters were identified and preserved.  A bladder flap was created and the bladder was dissected down off the lower uterine segment and cervix using electrocautery.  The broad ligaments were then opened, and the uterine arteries were skeletonized bilaterally, sealed and divided with the Ligasure device.  A colpotomy was performed circumferentially along the ring with electrocautery and the cervix was incised from the vagina and the specimen was removed through the vagina.  A pneumo balloon was placed in the vagina and the vaginal cuff was then closed in a running continuous fashion using the EndoStitch technique with 2-0 V-Lock suture with careful attention to include the vaginal cuff angles and the vaginal mucosa within the closure; Arista was then applied to the cuff.  The cystoscopy was then performed, with the above noted findings. The intraperitoneal pressure was dropped, and all planes of dissection, vascular pedicles and the vaginal cuff were found to be hemostatic.    The suprapubic trocar  was removed and the fascia was closed with 0 vicryl suture using the Shellia Carwin 2 device and the umbilical fascia closed the  same way.  The RLQ port was removed, the gas released and then the LLQ port removed after placement of a blunt probe.  The skin incision at the umbilicus and suprapubic site was closed with a subcuticular stitch of 4-0 monocryl.  The remaining skin incisions were closed with Dermabond glue.  The patient tolerated the procedure well.  Sponge, lap and needle counts were correct x2.  The patient was taken to recovery room in excellent condition.  Cornelia Copa MD Attending [facility] [employer] [facility])

## 2023-04-01 NOTE — Progress Notes (Signed)
 GYN Post Op Check Note  04/01/2023 - 4:38 PM  Brief Clinical History: 38 y.o. POD#0 s/p TLH/BS/cysto (EBL 150 mL) for fibroids, anemia.   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  Patient with increased lower belly pain/pressure and only 150 on bladder scan with UOP.   Subjective: +ambulation, PO, pain and pressure after I/O cath just now, no nausea, vomiting, chest pain, sob  Objective: Vitals:   04/01/23 1055 04/01/23 1115 04/01/23 1224 04/01/23 1254  BP: 128/68  133/79 129/72  Pulse: 87  73 75  Resp: 19  16 17   Temp:  97.6 F (36.4 C) 97.6 F (36.4 C) (!) 96.9 F (36.1 C)  TempSrc:   Oral Axillary  SpO2: 100%  100% 98%  Weight:      Height:         Current Vital Signs 24h Vital Sign Ranges  T (!) 96.9 F (36.1 C) Temp  Avg: 97.5 F (36.4 C)  Min: 96.9 F (36.1 C)  Max: 97.8 F (36.6 C)  BP 129/72 BP  Min: 128/68  Max: 134/85  HR 75 Pulse  Avg: 78.5  Min: 73  Max: 87  RR 17 Resp  Avg: 17.5  Min: 16  Max: 19  SaO2 98 % Room Air SpO2  Avg: 99 %  Min: 98 %  Max: 100 %       24 Hour I/O Current Shift I/O  Time Ins Outs No intake/output data recorded. 03/25 0701 - 03/25 1900 In: 800 [I.V.:600] Out: 475 [Urine:325]    General: NAD Abdomen: soft, nttp, nd, c/d/I l/s port sites GU: I/O just done and no gross VB and she had about fluorescein stained UOP Pulm: no respiratory distress Extremities: no clubbing, cyanosis or edema; SCDs in place Neuro: A&O x 3  Medications: Current Facility-Administered Medications  Medication Dose Route Frequency Provider Last Rate Last Admin   amisulpride (BARHEMSYS) 5 MG/2ML injection            famotidine (PEPCID) tablet 20 mg  20 mg Oral BID PRN [place] Bing, MD       gabapentin (NEURONTIN) capsule 300 mg  300 mg Oral BID Boaz Bing, MD       HYDROmorphone (DILAUDID) injection 1 mg  1 mg Intravenous Q2H PRN [place] Bing, MD   1 mg at 04/01/23 1321   ibuprofen (ADVIL) tablet  600 mg  600 mg Oral Q6H [place] Bing, MD       lactated ringers infusion   Intravenous Continuous [place] Bing, MD 40 mL/hr at 04/01/23 1558 New Bag at 04/01/23 1558   menthol-cetylpyridinium (CEPACOL) lozenge 3 mg  1 lozenge Oral Q2H PRN [place] Bing, MD       ondansetron (ZOFRAN) tablet 4 mg  4 mg Oral Q6H PRN [place] Bing, MD       Or   ondansetron (ZOFRAN) injection 4 mg  4 mg Intravenous Q6H PRN [place] Bing, MD   4 mg at 04/01/23 1556   oxyCODONE (Oxy IR/ROXICODONE) immediate release tablet 5-10 mg  5-10 mg Oral Q4H PRN [place] Bing, MD       Melene Muller ON 04/02/2023] polyethylene glycol (MIRALAX / GLYCOLAX) packet 17 g  17 g Oral Daily [place] Bing, MD       simethicone (MYLICON) chewable tablet 80 mg  80 mg Oral TID [place] Bing, MD        Laboratory: Recent Labs  Lab 03/26/23 0942  WBC 3.9*  HGB 12.7  HCT 38.7  PLT 242   Recent Labs  Lab 03/26/23 0942  NA 137  K 3.9  CL 103  CO2 27  BUN 10  CREATININE 0.76  CALCIUM 9.3  PROT 7.5  BILITOT 0.5  ALKPHOS 65  ALT 28  AST 32  GLUCOSE 92    Radiology: none  A/P: patient doing well * GYN: patient feeling better s/p I/O just now. Continue to follow. If needs again, will place indwelling overnight *CV: see below. Care to avoid fluid overload *FEN/GI: regular diet, MIVF at 40/hr. Can SLIV if continue to do well with PO *PPx: SCDs, OOB ad lib *Pain: PO and IV PRNs *Dispo: likely tomorrow *Full Code  Cornelia Copa MD Attending Center for [facility])

## 2023-04-02 ENCOUNTER — Encounter (HOSPITAL_COMMUNITY): Payer: Self-pay | Admitting: Obstetrics and Gynecology

## 2023-04-02 ENCOUNTER — Other Ambulatory Visit (HOSPITAL_COMMUNITY): Payer: Self-pay

## 2023-04-02 DIAGNOSIS — D259 Leiomyoma of uterus, unspecified: Secondary | ICD-10-CM | POA: Diagnosis not present

## 2023-04-02 DIAGNOSIS — N398 Other specified disorders of urinary system: Secondary | ICD-10-CM | POA: Diagnosis not present

## 2023-04-02 LAB — CBC
HCT: 31.4 % — ABNORMAL LOW (ref 36.0–46.0)
Hemoglobin: 10.9 g/dL — ABNORMAL LOW (ref 12.0–15.0)
MCH: 32.2 pg (ref 26.0–34.0)
MCHC: 34.7 g/dL (ref 30.0–36.0)
MCV: 92.9 fL (ref 80.0–100.0)
Platelets: 214 10*3/uL (ref 150–400)
RBC: 3.38 MIL/uL — ABNORMAL LOW (ref 3.87–5.11)
RDW: 15.1 % (ref 11.5–15.5)
WBC: 6.9 10*3/uL (ref 4.0–10.5)
nRBC: 0 % (ref 0.0–0.2)

## 2023-04-02 LAB — BASIC METABOLIC PANEL
Anion gap: 9 (ref 5–15)
BUN: 6 mg/dL (ref 6–20)
CO2: 23 mmol/L (ref 22–32)
Calcium: 8.9 mg/dL (ref 8.9–10.3)
Chloride: 107 mmol/L (ref 98–111)
Creatinine, Ser: 0.79 mg/dL (ref 0.44–1.00)
GFR, Estimated: >60 mL/min (ref >60)
Glucose, Bld: 106 mg/dL — ABNORMAL HIGH (ref 70–99)
Potassium: 3.5 mmol/L (ref 3.5–5.1)
Sodium: 139 mmol/L (ref 135–145)

## 2023-04-02 MED ORDER — PHENAZOPYRIDINE HCL 200 MG PO TABS
200.0000 mg | ORAL_TABLET | Freq: Three times a day (TID) | ORAL | 0 refills | Status: AC
  Filled 2023-04-02: qty 6, 2d supply, fill #0

## 2023-04-02 MED ORDER — OXYCODONE-ACETAMINOPHEN 5-325 MG PO TABS: 1.0000 | ORAL_TABLET | Freq: Four times a day (QID) | ORAL | 0 refills | Status: DC | PRN

## 2023-04-02 MED ORDER — GABAPENTIN 300 MG PO CAPS
300.0000 mg | ORAL_CAPSULE | Freq: Two times a day (BID) | ORAL | 0 refills | Status: DC
  Filled 2023-04-02: qty 20, 10d supply, fill #0

## 2023-04-02 MED ORDER — POLYETHYLENE GLYCOL 3350 17 GM/SCOOP PO POWD
17.0000 g | Freq: Every day | ORAL | 0 refills | Status: AC
  Filled 2023-04-02: qty 238, 14d supply, fill #0

## 2023-04-02 MED ORDER — IBUPROFEN 600 MG PO TABS
600.0000 mg | ORAL_TABLET | Freq: Four times a day (QID) | ORAL | 0 refills | Status: AC | PRN
  Filled 2023-04-02: qty 30, 8d supply, fill #0

## 2023-04-02 MED ORDER — OXYCODONE-ACETAMINOPHEN 5-325 MG PO TABS
1.0000 | ORAL_TABLET | Freq: Four times a day (QID) | ORAL | 0 refills | Status: DC | PRN
  Filled 2023-04-02: qty 20, 3d supply, fill #0

## 2023-04-02 MED ORDER — SIMETHICONE 80 MG PO CHEW
80.0000 mg | CHEWABLE_TABLET | Freq: Three times a day (TID) | ORAL | 0 refills | Status: AC
  Filled 2023-04-02: qty 30, 10d supply, fill #0

## 2023-04-02 NOTE — Progress Notes (Signed)
 GYN Post Op Check Note  04/02/2023 - 9:00 AM  Brief Clinical History: 38 y.o. POD#1 s/p TLH/BS/cysto (EBL 150 mL) for fibroids, anemia.   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  Overnight/24h events: patient had issues with voiding post op. She got I/O cathed for good UOP and then had issues with voiding with low void amount and difficulty with bladder scan overnight, due to bmi, so indwelling placed a little before midnight. 57mL/h UOP that increased after bolus and restarting MIVF at 123mL/h after foley placed.   Subjective: no flatus yet and no n/v and taking PO w/o issue. Pain controlled and +ambulation.   Objective: Vitals:   04/01/23 1940 04/01/23 2358 04/02/23 0352 04/02/23 0816  BP: (!) 146/75 131/62 133/66 126/64  Pulse: 71 84 84 81  Resp: 16 16 16 18   Temp: 98 F (36.7 C) 98.1 F (36.7 C) 98.4 F (36.9 C) 98.2 F (36.8 C)  TempSrc: Oral Oral Oral Oral  SpO2: 100% 100% 97% 100%  Weight:      Height:         Current Vital Signs 24h Vital Sign Ranges  T 98.2 F (36.8 C) Temp  Avg: 97.9 F (36.6 C)  Min: 96.9 F (36.1 C)  Max: 98.4 F (36.9 C)  BP 126/64 BP  Min: 126/64  Max: 146/75  HR 81 Pulse  Avg: 78.4  Min: 71  Max: 87  RR 18 Resp  Avg: 16.8  Min: 16  Max: 19  SaO2 100 % Room Air SpO2  Avg: 99.4 %  Min: 97 %  Max: 100 %       24 Hour I/O Current Shift I/O  Time Ins Outs 03/25 0701 - 03/26 0700 In: 1766.2 [P.O.:400; I.V.:1166.2] Out: 1400 [Urine:1250] No intake/output data recorded.   General: NAD Abdomen: soft, nttp, nd, c/d/I l/s port sites. Rare BS GU: indwelling foley in place with fluorescein stained UOP, approximately 50mL. No gross vaginal bleeding.  Pulm: no respiratory distress Extremities: no clubbing, cyanosis or edema; SCDs in place Neuro: A&O x 3  Medications: Current Facility-Administered Medications  Medication Dose Route Frequency Provider Last Rate Last Admin   famotidine (PEPCID) tablet 20 mg  20  mg Oral BID PRN [place] Bing, MD       gabapentin (NEURONTIN) capsule 300 mg  300 mg Oral BID [place] Bing, MD   300 mg at 04/01/23 2302   HYDROmorphone (DILAUDID) injection 1 mg  1 mg Intravenous Q2H PRN [place] Bing, MD   1 mg at 04/01/23 1321   ibuprofen (ADVIL) tablet 600 mg  600 mg Oral Q6H [place] Bing, MD   600 mg at 04/02/23 4098   lactated ringers infusion   Intravenous Continuous [place] Bing, MD 100 mL/hr at 04/02/23 0105 New Bag at 04/02/23 0105   menthol-cetylpyridinium (CEPACOL) lozenge 3 mg  1 lozenge Oral Q2H PRN [place] Bing, MD   3 mg at 04/01/23 1852   ondansetron (ZOFRAN) tablet 4 mg  4 mg Oral Q6H PRN [place] Bing, MD       Or   ondansetron (ZOFRAN) injection 4 mg  4 mg Intravenous Q6H PRN [place] Bing, MD   4 mg at 04/01/23 1556   oxyCODONE (Oxy IR/ROXICODONE) immediate release tablet 5-10 mg  5-10 mg Oral Q4H PRN [place] Bing, MD   5 mg at 04/02/23 1191   phenazopyridine (PYRIDIUM) tablet 200 mg  200 mg Oral TID [place] Bing, MD   200 mg  at 04/02/23 0812   polyethylene glycol (MIRALAX / GLYCOLAX) packet 17 g  17 g Oral Daily [place] Bing, MD       simethicone [facility]) chewable tablet 80 mg  80 mg Oral TID  Bing, MD   80 mg at 04/01/23 2302    Laboratory: Recent Labs  Lab 03/26/23 0942 04/02/23 0519  WBC 3.9* 6.9  HGB 12.7 10.9*  HCT 38.7 31.4*  PLT 242 214   Recent Labs  Lab 03/26/23 0942 04/02/23 0519  NA 137 139  K 3.9 3.5  CL 103 107  CO2 27 23  BUN 10 6  CREATININE 0.76 0.79  CALCIUM 9.3 8.9  PROT 7.5  --   BILITOT 0.5  --   ALKPHOS 65  --   ALT 28  --   AST 32  --   GLUCOSE 92 106*   Radiology: none  A/P: patient doing well *GYN: labs look good. will do void trial later this morning. D/w patient that may have to go home with indwelling if doesn't pass.  *CV: no current issues.  *FEN/GI: regular diet. Continue with MIVF at 100/hr for now.  *PPx: SCDs, OOB ad lib *Pain: PO  PRNs *Dispo: later today.  *Full Code  Cornelia Copa MD Attending Center for [facility])

## 2023-04-02 NOTE — Plan of Care (Signed)
 Problem: Education: Goal: Knowledge of General Education information will improve Description: Including pain rating scale, medication(s)/side effects and non-pharmacologic comfort measures 04/02/2023 1337 by Evans Lance, RN Outcome: Adequate for Discharge 04/02/2023 1300 by Evans Lance, RN Outcome: Adequate for Discharge   Problem: Health Behavior/Discharge Planning: Goal: Ability to manage health-related needs will improve 04/02/2023 1337 by Evans Lance, RN Outcome: Adequate for Discharge 04/02/2023 1300 by Evans Lance, RN Outcome: Adequate for Discharge   Problem: Clinical Measurements: Goal: Ability to maintain clinical measurements within normal limits will improve 04/02/2023 1337 by Evans Lance, RN Outcome: Adequate for Discharge 04/02/2023 1300 by Evans Lance, RN Outcome: Adequate for Discharge Goal: Will remain free from infection 04/02/2023 1337 by Evans Lance, RN Outcome: Adequate for Discharge 04/02/2023 1300 by Evans Lance, RN Outcome: Adequate for Discharge Goal: Diagnostic test results will improve 04/02/2023 1337 by Evans Lance, RN Outcome: Adequate for Discharge 04/02/2023 1300 by Evans Lance, RN Outcome: Adequate for Discharge Goal: Respiratory complications will improve 04/02/2023 1337 by Evans Lance, RN Outcome: Adequate for Discharge 04/02/2023 1300 by Evans Lance, RN Outcome: Adequate for Discharge Goal: Cardiovascular complication will be avoided 04/02/2023 1337 by Evans Lance, RN Outcome: Adequate for Discharge 04/02/2023 1300 by Evans Lance, RN Outcome: Adequate for Discharge   Problem: Activity: Goal: Risk for activity intolerance will decrease 04/02/2023 1337 by Evans Lance, RN Outcome: Adequate for Discharge 04/02/2023 1300 by Evans Lance, RN Outcome: Adequate for Discharge   Problem: Nutrition: Goal: Adequate nutrition will be maintained 04/02/2023 1337 by Evans Lance,  RN Outcome: Adequate for Discharge 04/02/2023 1300 by Evans Lance, RN Outcome: Adequate for Discharge   Problem: Coping: Goal: Level of anxiety will decrease 04/02/2023 1337 by Evans Lance, RN Outcome: Adequate for Discharge 04/02/2023 1300 by Evans Lance, RN Outcome: Adequate for Discharge   Problem: Elimination: Goal: Will not experience complications related to bowel motility 04/02/2023 1337 by Evans Lance, RN Outcome: Adequate for Discharge 04/02/2023 1300 by Evans Lance, RN Outcome: Adequate for Discharge Goal: Will not experience complications related to urinary retention 04/02/2023 1337 by Evans Lance, RN Outcome: Adequate for Discharge 04/02/2023 1300 by Evans Lance, RN Outcome: Adequate for Discharge   Problem: Pain Managment: Goal: General experience of comfort will improve and/or be controlled 04/02/2023 1337 by Evans Lance, RN Outcome: Adequate for Discharge 04/02/2023 1300 by Evans Lance, RN Outcome: Adequate for Discharge   Problem: Safety: Goal: Ability to remain free from injury will improve 04/02/2023 1337 by Evans Lance, RN Outcome: Adequate for Discharge 04/02/2023 1300 by Evans Lance, RN Outcome: Adequate for Discharge   Problem: Skin Integrity: Goal: Risk for impaired skin integrity will decrease 04/02/2023 1337 by Evans Lance, RN Outcome: Adequate for Discharge 04/02/2023 1300 by Evans Lance, RN Outcome: Adequate for Discharge   Problem: Education: Goal: Knowledge of the prescribed therapeutic regimen will improve 04/02/2023 1337 by Evans Lance, RN Outcome: Adequate for Discharge 04/02/2023 1300 by Evans Lance, RN Outcome: Adequate for Discharge Goal: Understanding of sexual limitations or changes related to disease process or condition will improve 04/02/2023 1337 by Evans Lance, RN Outcome: Adequate for Discharge 04/02/2023 1300 by Evans Lance, RN Outcome: Adequate for  Discharge Goal: Individualized Educational Video(s) 04/02/2023 1337 by Evans Lance, RN Outcome: Adequate for Discharge 04/02/2023 1300 by Evans Lance, RN Outcome: Adequate for Discharge  Problem: Self-Concept: Goal: Communication of feelings regarding changes in body function or appearance will improve 04/02/2023 1337 by Evans Lance, RN Outcome: Adequate for Discharge 04/02/2023 1300 by Evans Lance, RN Outcome: Adequate for Discharge   Problem: Skin Integrity: Goal: Demonstration of wound healing without infection will improve 04/02/2023 1337 by Evans Lance, RN Outcome: Adequate for Discharge 04/02/2023 1300 by Evans Lance, RN Outcome: Adequate for Discharge

## 2023-04-02 NOTE — Plan of Care (Signed)

## 2023-04-02 NOTE — Progress Notes (Deleted)
error 

## 2023-04-02 NOTE — Discharge Summary (Signed)
 Gynecology Discharge Summary Date of Admission: 04/01/2023 Date of Discharge: 04/02/2023  The patient was admitted, as scheduled, and underwent a TLH/BS/cysto (EBL ); please refer to operative note for full details.    When she got to the floor from the PACU, patient had issues with voiding post op and got bladder scanned and then I/O done for pressure and low UOP. Close to midnight on POD#0, with low void amount and difficulty with bladder scan overnight, due to bmi, so indwelling placed a little before midnight. 69mL/h UOP that increased after bolus and restarting MIVF at 120mL/h after foley placed; patient had minimal MIVF post op due to her h/o MVR and trying to keep her from being fluid overloaded.   POD#1 patient had void trial via backfilling the bladder. She was able to tolerate backfill, foley removed and she voided 237mL-300mL in the toilet hat so she was discharged to home without a foley. Instructions given to do scheduled voids at home q102m for the first few days while awake.   Allergies as of 04/02/2023   No Known Allergies      Medication List     STOP taking these medications    acetaminophen 325 MG tablet Commonly known as: TYLENOL   norethindrone 5 MG tablet Commonly known as: AYGESTIN       TAKE these medications    gabapentin 300 MG capsule Commonly known as: NEURONTIN Take 1 capsule (300 mg total) by mouth 2 (two) times daily for 10 days.   ibuprofen 600 MG tablet Commonly known as: ADVIL Take 1 tablet (600 mg total) by mouth every 6 (six) hours as needed.   MULTIVITAMIN WOMEN PO Take 1 capsule by mouth daily.   OVER THE COUNTER MEDICATION Take 2 tablets by mouth daily. [employer] Iron Blood Builder   oxyCODONE-acetaminophen 5-325 MG tablet Commonly known as: PERCOCET/ROXICET Take 1-2 tablets by mouth every 6 (six) hours as needed.   phenazopyridine 200 MG tablet Commonly known as: PYRIDIUM Take 1 tablet (200 mg total) by mouth 3  (three) times daily with meals for 2 days.   polyethylene glycol 17 g packet Commonly known as: MIRALAX / GLYCOLAX Take 17 g by mouth daily for 7 days.   simethicone 80 MG chewable tablet Commonly known as: MYLICON Chew 1 tablet (80 mg total) by mouth 3 (three) times daily for 3 days.        Future Appointments  Date Time Provider Department Center  05/21/2023  2:35 PM [place] Bing, MD [facility]    Cornelia Copa MD Attending Center for [facility])

## 2023-04-02 NOTE — Progress Notes (Signed)
 Discharge AVS instructions reviewed with patient, post op care, meds, and post-op f/u. Reviewed post op bladder care per Dr. Vergie Living notes. Patient verbalizes understanding.

## 2023-04-02 NOTE — Discharge Instructions (Addendum)
 Attempt to pee at home every hour and half for the next few days, when not sleeping. If having issues with peeing, come back to the hospital.   Laparoscopic Surgery Discharge Instructions  Instructions Following Major Laparoscopic Surgery You have just undergone a major laparoscopic surgery.  The following list should answer your most common questions.  Although we will discuss your surgery and post-operative instructions with you prior to your discharge, this list will serve as a reminder if you fail to recall the details of what we discussed.  We will discuss your surgery once again in detail at your post-op visit in two to four weeks. If you haven't already done so, please call to make your appointment as soon as possible.  How you will feel: Although you have just undergone a major surgery, your recovery will be significantly shorter since the surgery was performed through much smaller incisions than the traditional approach.  You should feel slightly better each day.  If you suddenly feel much worse than the prior day, please call the clinic.  It's important during the early part of your recovery that you maintain some activity.  Walking is encouraged.  You will quicken your recovery by continued activity.  Incision:  Your incisions will be closed with dissolvable stitches or surgical adhesive (glue).  There may be Band-aids and/or Steri-strips covering your incisions.  If there is no drainage from the incisions you may remove the Band-aids in one to two days.  You may notice some minor bruising at the incision sites.  This is common and will resolve within several days.  Please inform us if the redness at the edges of your incision appears to be spreading.  If the skin around your incision becomes warm to the touch, or if you notice a pus-like drainage, please call the office.  Vaginal Discharge Following a Laparoscopic Hysterectomy: Minor vaginal bleeding or spotting is normal following a  hysterectomy.  Bleeding similar to the amount of your period is excessive, and you should inform us of this immediately.  Vaginal spotting may continue for several weeks following your surgery.  You may notice a yellowish discharge which occasionally occurs as the vaginal stitches dissolve, and may last for several weeks.  Sexual Activity Following a Hysterectomy: Do not have sexual intercourse or place tampons or douches in the vagina prior to your first office visit.  We will discuss when you may resume these activities at that visit.    Stairs/Driving/Activities: You may climb stairs if necessary.  If you've had general anesthesia, do not drive a car the rest of the day today.  You may begin light housework when you feel up to it, but avoid heavy lifting (more than 15-20lbs) or pushing until cleared for these activities by your physician.  Hygiene:  Do not soak your incisions.  Showers are acceptable but you may not take a bath or swim in a pool.  Cleanse your incisions daily with soap and water.  Medications:  Please resume taking any medications that you were taking prior to the surgery.  If we have prescribed any new medications for you, please take them as directed.  Constipation:  It is fairly common to experience some difficulty in moving your bowels following major surgery.  Being active will help to reduce this likelihood. A diet rich in fiber and plenty of liquids is desirable.  If you do become constipated, a mild laxative such as Miralax, Milk of Magnesia, or Metamucil, or a stool softener  such as Colace, is recommended.  General Instructions: If you develop a fever of 100.5 degrees or higher, please call the office number(s) below for physician on call.

## 2023-04-03 ENCOUNTER — Encounter: Payer: Self-pay | Admitting: Obstetrics and Gynecology

## 2023-04-03 LAB — SURGICAL PATHOLOGY

## 2023-04-04 ENCOUNTER — Telehealth: Payer: Self-pay | Admitting: *Deleted

## 2023-04-04 NOTE — Telephone Encounter (Signed)
 Received a voice message from 04/03/23 stating she was discharged from hospital 04/02/23 and is a patient of Dr. Vergie Living. She states she had a little leakage at incision and not sure what to do and would like a call. Per chart review she also sent MyChart messages , pictures of her incision which were reviewed by staff and message sent back to patient and issue addressed. Darlene Hayes

## 2023-04-29 ENCOUNTER — Encounter: Payer: Self-pay | Admitting: Cardiology

## 2023-05-14 ENCOUNTER — Encounter: Payer: Self-pay | Admitting: Obstetrics and Gynecology

## 2023-05-14 ENCOUNTER — Telehealth: Payer: Self-pay | Admitting: Family Medicine

## 2023-05-14 NOTE — Telephone Encounter (Signed)
 Called pt regarding her request for a detailed doctors note for a LOA that has expired. Pt was informed that we could provide her a work excuse note on the day of her appointment for her post-op visit, but that we are unable to write her out of work for any more extended time. Pt then asked if the post op appointment was necessary because she did not want to take off work just for a discussion and requested to be able to send pictures in of her incisions. Pt informed that is it recommended she does attend her post-op appointment as we are not able to evaluate healing through pictures but that it was her decision whether she attends the appointment or not. Pt stated she needed the extended LOA for future appointments. Pt informed there were no appointments needed after her post-op appointment and therefore we are only able to provide a work excuse note for the day of the appointment that can be received on the day of the appointment.  Pt then stated she would just call and request the extended LOA through her PCP. RN voiced understanding and again recommended attending her post-op appointment. Pt voiced understanding and denied any further questions or concerns.   Dino Frank, Charity fundraiser

## 2023-05-14 NOTE — Telephone Encounter (Signed)
 Patient is calling because she needs a detailed doctors note for a LOA that has expired that will allow her to be excused from work so that she can come for her post op appts. Patient has 6 business days to turn this in.

## 2023-05-21 ENCOUNTER — Ambulatory Visit: Admitting: Obstetrics and Gynecology

## 2023-05-21 ENCOUNTER — Encounter: Payer: Self-pay | Admitting: Obstetrics and Gynecology

## 2023-05-21 ENCOUNTER — Other Ambulatory Visit: Payer: Self-pay

## 2023-05-21 VITALS — BP 125/84 | HR 93 | Ht 66.0 in | Wt 242.3 lb

## 2023-05-21 DIAGNOSIS — Z09 Encounter for follow-up examination after completed treatment for conditions other than malignant neoplasm: Secondary | ICD-10-CM

## 2023-05-21 NOTE — Progress Notes (Signed)
 OBGYN Post op Check 05/21/2023  Subjective:     Darlene Hayes is a 38 y.o. here for regularly scheduled post op check s/p 3/25 TLH/BS/cysto for AUB; she was discharged to home pod#1 and had an uncomplicated post op course. Final pathology negative  Patient doing well and denies any problems or concerns and, specificially, no GYN s/s.   Review of Systems Pertinent items are noted in HPI.    Objective:    BP 125/84 (BP Location: Right Arm, Patient Position: Sitting, Cuff Size: Large)   Pulse 93   Ht 5\' 6"  (1.676 m)   Wt 242 lb 4.8 oz (109.9 kg)   SpO2 99%   BMI 39.11 kg/m  General:  alert  Abdomen: soft, bowel sounds active, non-tender, well healed l/s port sites  Pelvic:   Patient declines     Assessment:    Patient stable   Plan:   Recommend a full 8 weeks before coming off lifting restrictions. Patient states she is not sexually active but I told her that I recommend a pelvic exam to ensure cuff is healed, but she declines. I told her I do not recommend intercourse until exam is done and pt is amenable  RTC PRN  Tyler Gallant MD Attending Center for [employer] [facility])

## 2023-06-12 ENCOUNTER — Encounter: Payer: Self-pay | Admitting: Cardiology

## 2023-08-21 ENCOUNTER — Other Ambulatory Visit: Payer: Self-pay

## 2023-08-21 ENCOUNTER — Encounter: Payer: Self-pay | Admitting: *Deleted

## 2023-08-21 ENCOUNTER — Encounter: Payer: Self-pay | Admitting: Cardiology

## 2023-08-21 ENCOUNTER — Ambulatory Visit
Admission: EM | Admit: 2023-08-21 | Discharge: 2023-08-21 | Disposition: A | Discharge status: Home / Self Care | Attending: Physician Assistant | Admitting: Physician Assistant

## 2023-08-21 DIAGNOSIS — R0789 Other chest pain: Secondary | ICD-10-CM

## 2023-08-21 NOTE — ED Triage Notes (Signed)
 Pt reports right side chest pain today, onset 1pm, while she was working out. States pain started in the right side of her chest and radiates to the middle. States she felt tightness, fluttery feeling with onset of the pain

## 2023-08-22 ENCOUNTER — Emergency Department (HOSPITAL_COMMUNITY)

## 2023-08-22 ENCOUNTER — Other Ambulatory Visit: Payer: Self-pay

## 2023-08-22 ENCOUNTER — Encounter (HOSPITAL_COMMUNITY): Payer: Self-pay | Admitting: *Deleted

## 2023-08-22 ENCOUNTER — Emergency Department (HOSPITAL_COMMUNITY)
Admission: EM | Admit: 2023-08-22 | Discharge: 2023-08-23 | Discharge status: Against Medical Advice | Attending: Emergency Medicine | Admitting: Emergency Medicine

## 2023-08-22 DIAGNOSIS — Z5321 Procedure and treatment not carried out due to patient leaving prior to being seen by health care provider: Secondary | ICD-10-CM | POA: Insufficient documentation

## 2023-08-22 DIAGNOSIS — R0602 Shortness of breath: Secondary | ICD-10-CM | POA: Diagnosis not present

## 2023-08-22 DIAGNOSIS — R079 Chest pain, unspecified: Secondary | ICD-10-CM | POA: Insufficient documentation

## 2023-08-22 LAB — CBC WITH DIFFERENTIAL/PLATELET
Abs Immature Granulocytes: 0.02 K/uL (ref 0.00–0.07)
Basophils Absolute: 0 K/uL (ref 0.0–0.1)
Basophils Relative: 1 %
Eosinophils Absolute: 0.2 K/uL (ref 0.0–0.5)
Eosinophils Relative: 3 %
HCT: 40 % (ref 36.0–46.0)
Hemoglobin: 13.3 g/dL (ref 12.0–15.0)
Immature Granulocytes: 0 %
Lymphocytes Relative: 35 %
Lymphs Abs: 1.8 K/uL (ref 0.7–4.0)
MCH: 31.3 pg (ref 26.0–34.0)
MCHC: 33.3 g/dL (ref 30.0–36.0)
MCV: 94.1 fL (ref 80.0–100.0)
Monocytes Absolute: 0.4 K/uL (ref 0.1–1.0)
Monocytes Relative: 8 %
Neutro Abs: 2.8 K/uL (ref 1.7–7.7)
Neutrophils Relative %: 53 %
Platelets: 214 K/uL (ref 150–400)
RBC: 4.25 MIL/uL (ref 3.87–5.11)
RDW: 14.1 % (ref 11.5–15.5)
WBC: 5.2 K/uL (ref 4.0–10.5)
nRBC: 0 % (ref 0.0–0.2)

## 2023-08-22 LAB — COMPREHENSIVE METABOLIC PANEL WITH GFR
ALT: 17 U/L (ref 0–44)
AST: 25 U/L (ref 15–41)
Albumin: 3.7 g/dL (ref 3.5–5.0)
Alkaline Phosphatase: 95 U/L (ref 38–126)
Anion gap: 12 (ref 5–15)
BUN: 9 mg/dL (ref 6–20)
CO2: 22 mmol/L (ref 22–32)
Calcium: 8.9 mg/dL (ref 8.9–10.3)
Chloride: 103 mmol/L (ref 98–111)
Creatinine, Ser: 0.75 mg/dL (ref 0.44–1.00)
GFR, Estimated: >60 mL/min (ref >60)
Glucose, Bld: 123 mg/dL — ABNORMAL HIGH (ref 70–99)
Potassium: 3.4 mmol/L — ABNORMAL LOW (ref 3.5–5.1)
Sodium: 137 mmol/L (ref 135–145)
Total Bilirubin: 0.6 mg/dL (ref 0.0–1.2)
Total Protein: 7.5 g/dL (ref 6.5–8.1)

## 2023-08-22 LAB — LIPASE, BLOOD: Lipase: 30 U/L (ref 11–51)

## 2023-08-22 LAB — BRAIN NATRIURETIC PEPTIDE: B Natriuretic Peptide: 7 pg/mL (ref 0.0–100.0)

## 2023-08-22 LAB — TROPONIN I (HIGH SENSITIVITY): Troponin I (High Sensitivity): 3 ng/L (ref <18)

## 2023-08-22 NOTE — Telephone Encounter (Signed)
 Chest pain- feels like something is sitting in the middle of her chest, she reports some fluttering at times. She reports that this happens mostly when she is active; she also feels it when she is laying on her right side.   This has been going on for about a month. She does report some sweating at times with the pressure and fluttering- No dizziness or sob- denies other s/s.  Gave her a few options for dates and times with APP appointments for( 8/28 and 8/29 ) She has to check with her work- she will call back and let us  know what appointment works for her.

## 2023-08-22 NOTE — ED Triage Notes (Signed)
 The pt has had multiple complaints for one week  she hurts all over her body

## 2023-08-22 NOTE — ED Provider Triage Note (Signed)
 Emergency Medicine Provider Triage Evaluation Note  Darlene Hayes , a 38 y.o. female  was evaluated in triage.  Pt complains of chest pain.  Patient reports ongoing chest pain with radiation towards bilateral sides of her chest as well as towards her back.  Endorses some associated shortness of breath.  She states that she called her cardiology office today and was advised to come the emergency department if she had concerns or worsening symptoms.  No reported fever, chills or bodyaches.  Denies any significant shortness of breath at rest.  Review of Systems  Positive: As above Negative: As above  Physical Exam  BP 128/84   Pulse 100   Temp 98.5 F (36.9 C) (Oral)   Resp 16   Ht 5' 6 (1.676 m)   Wt 109.9 kg   LMP 03/19/2023 (Approximate) Comment: continuously bleeding since Oct 2024  SpO2 92%   BMI 39.11 kg/m  Gen:   Awake, no distress   Resp:  Normal effort MSK:   Moves extremities without difficulty  Other:    Medical Decision Making  Medically screening exam initiated at 8:32 PM.  Appropriate orders placed.  Darlene Hayes was informed that the remainder of the evaluation will be completed by another provider, this initial triage assessment does not replace that evaluation, and the importance of remaining in the ED until their evaluation is complete.     Alanee Ting A, PA-C 08/22/23 2033

## 2023-08-22 NOTE — ED Triage Notes (Signed)
 Pt reports all over chest pain x 1 week that radiates into her back associated with shortness of breath. She went to Urgent Care yesterday.

## 2023-08-23 LAB — TROPONIN I (HIGH SENSITIVITY): Troponin I (High Sensitivity): 2 ng/L (ref <18)

## 2023-08-23 NOTE — ED Notes (Signed)
 Patient stated she was leaving she did not want to wait any longer.

## 2023-08-23 NOTE — ED Notes (Signed)
 Repeat EKG was done and taken to EDP.

## 2023-08-24 ENCOUNTER — Encounter: Payer: Self-pay | Admitting: Physician Assistant

## 2023-08-24 NOTE — ED Provider Notes (Signed)
 [facility]    CSN: [id number] Arrival date & time: 08/21/23  1650      History   Chief Complaint No chief complaint on file.   HPI Darlene Hayes is a 38 y.o. female.   Patient here today for evaluation of right sided chest pain that occurred earlier today. It has resolved before today's visit. She reports she was working out when pain started and she noted some palpitations. She has not had any shortness of breath. She denies lightheadedness.   The history is provided by the patient.    Past Medical History:  Diagnosis Date   Abnormal uterine bleeding (AUB)    History of abnormal cervical Pap smear    since 2000   History of chlamydia infection 07/2010   treated. hasn't been with that partner since.    History of gestational diabetes    History of gonorrhea    treated in July 2012. hasn't been with that partner since.    History of pregnancy induced hypertension    Iron deficiency anemia due to chronic blood loss    referred by hematology--- dr pauletta chihuahua---   IV iron infusion,  [id number];  12-14-2022;   01-21-2023;  01-28-2023   Mitral regurgitation    Moderate to severe mitral regurgitation 11/06/2022   evalulated by cardiologoy-- dr marla. tobb note in epic 12-24-2022;  per echo 11-07-2022 ef 50-55%,  RVSP 63.9,  mod-sev LAE, mild TR;   pt also was seen by another cardiology in [place] -- Dr FABIENE Caroline note in care everywhere 11-29-2022,  TEE 12-03-2022 , ef 60-65%,  mild-mod MR RVSP normal, normal LAE   Pulmonary HTN (HCC)    Symptomatic anemia    (01-24-2023  pt stated symptoms are chest pain, intermittant heart flutters, sob, weakness, fatigue)  admission in epic 11-06-2022  --- SOB / CP/  vaginal bleeding /  Hg2.6;   transfused 3units PRBC and 2 iron infusions ;  also referred to cardiology due to echo w/ moderate to severe MR unspecified pulm HTN   Uterine fibroid     Patient Active Problem List   Diagnosis Date Noted   Voiding dysfunction  04/02/2023   Mitral valve regurgitation 04/01/2023   Mitral valve insufficiency 11/13/2022   Intramural and submucous leiomyoma of uterus 11/13/2022   Tobacco use 11/06/2022   Cardiomegaly 11/06/2022   Abnormal EKG 11/06/2022   Elevated blood pressure reading 07/02/2019   Hyperlipidemia 01/30/2017   Obesity (BMI 30-39.9) 01/16/2017   Tinea corporis 01/16/2017   Marijuana smoker 03/04/2015   Smoker 10/13/2012    Past Surgical History:  Procedure Laterality Date   CYSTOSCOPY N/A 04/01/2023   Procedure: CYSTOSCOPY;  Surgeon: Izell Harari, MD;  Location: [facility] OR;  Service: Gynecology;  Laterality: N/A;   DILATION AND CURETTAGE OF UTERUS  12/2016   EAB done in [place]   TEE WITHOUT CARDIOVERSION  12/03/2022   [place] , [place] by dr r. blanco   TOTAL LAPAROSCOPIC HYSTERECTOMY WITH SALPINGECTOMY Bilateral 04/01/2023   Procedure: TOTAL LAPAROSCOPIC HYSTERECTOMY WITH BILATERAL SALPINGECTOMY;  Surgeon: Izell Harari, MD;  Location: [facility] OR;  Service: Gynecology;  Laterality: Bilateral;    OB History     Gravida  2   Para  1   Term  1   Preterm  0   AB  1   Living  1      SAB  0   IAB  1   Ectopic  0   Multiple  0  Live Births  1            Home Medications    Prior to Admission medications   Medication Sig Start Date End Date Taking? Authorizing Provider  ibuprofen  (ADVIL ) 600 MG tablet Take 1 tablet (600 mg total) by mouth every 6 (six) hours as needed. 04/02/23  Yes Izell Harari, MD  Multiple Vitamins-Minerals (MULTIVITAMIN WOMEN PO) Take 1 capsule by mouth daily.   Yes [provider]  OVER THE COUNTER MEDICATION Take 2 tablets by mouth daily. [employer] Iron Blood Builder   Yes [provider]  gabapentin  (NEURONTIN ) 300 MG capsule Take 1 capsule (300 mg total) by mouth 2 (two) times daily for 10 days. Patient not taking: Reported on 08/21/2023 04/02/23 04/12/23  Izell Harari, MD  oxyCODONE -acetaminophen  (PERCOCET/ROXICET) 5-325 MG  tablet Take 1-2 tablets by mouth every 6 (six) hours as needed. Patient not taking: Reported on 08/21/2023 04/02/23   Izell Harari, MD  simethicone  [facility]) 80 MG chewable tablet Chew 1 tablet (80 mg total) by mouth 3 (three) times daily for 3 days. Patient not taking: Reported on 08/21/2023 04/02/23 04/12/23  Izell Harari, MD    Family History Family History  Problem Relation Age of Onset   Hypertension Mother    Miscarriages / Stillbirths Sister    Hypertension Sister    Stroke Maternal Grandmother    Diabetes Maternal Grandmother     Social History Social History   Tobacco Use   Smoking status: Former    Types: Cigarettes   Smokeless tobacco: Never   Tobacco comments:    01-24-2023  pt stated quit smoking 10/ 2024,  started age 40  Vaping Use   Vaping status: Former   Devices: quit  2023  Substance Use Topics   Alcohol use: No   Drug use: Not Currently    Types: Marijuana    Comment: 01-24-2023  per pt last smoked marijuana 10/ 2024     Allergies   Patient has no known allergies.   Review of Systems Review of Systems  Constitutional:  Negative for chills and fever.  Eyes:  Negative for discharge and redness.  Respiratory:  Negative for shortness of breath.   Cardiovascular:  Positive for chest pain (right sided).  Gastrointestinal:  Negative for abdominal pain, nausea and vomiting.  Neurological:  Negative for light-headedness.     Physical Exam Triage Vital Signs ED Triage Vitals  Encounter Vitals Group     BP 08/21/23 1731 119/84     Girls Systolic BP Percentile --      Girls Diastolic BP Percentile --      Boys Systolic BP Percentile --      Boys Diastolic BP Percentile --      Pulse Rate 08/21/23 1731 94     Resp 08/21/23 1731 18     Temp 08/21/23 1731 98.1 F (36.7 C)     Temp Source 08/21/23 1731 Oral     SpO2 08/21/23 1731 97 %     Weight --      Height --      Head Circumference --      Peak Flow --      Pain Score 08/21/23 1727 8      Pain Loc --      Pain Education --      Exclude from Growth Chart --    No data found.  Updated Vital Signs BP 119/84 (BP Location: Right Arm)   Pulse 94   Temp  98.1 F (36.7 C) (Oral)   Resp 18   LMP 03/19/2023 (Approximate) Comment: continuously bleeding since Oct 2024  SpO2 97%   Breastfeeding No   Visual Acuity Right Eye Distance:   Left Eye Distance:   Bilateral Distance:    Right Eye Near:   Left Eye Near:    Bilateral Near:     Physical Exam Vitals and nursing note reviewed.  Constitutional:      General: She is not in acute distress.    Appearance: Normal appearance. She is not ill-appearing.  HENT:     Head: Normocephalic and atraumatic.  Eyes:     Conjunctiva/sclera: Conjunctivae normal.  Cardiovascular:     Rate and Rhythm: Normal rate and regular rhythm.  Pulmonary:     Effort: Pulmonary effort is normal. No respiratory distress.     Breath sounds: No wheezing, rhonchi or rales.  Neurological:     Mental Status: She is alert.  Psychiatric:        Mood and Affect: Mood normal.        Behavior: Behavior normal.        Thought Content: Thought content normal.      UC Treatments / Results  Labs (all labs ordered are listed, but only abnormal results are displayed) Labs Reviewed - No data to display  EKG   Radiology DG Chest 2 View Result Date: 08/22/2023 CLINICAL DATA:  Chest pain EXAM: CHEST - 2 VIEW COMPARISON:  None Available. FINDINGS: Normal mediastinum and cardiac silhouette. Normal pulmonary vasculature. No evidence of effusion, infiltrate, or pneumothorax. No acute bony abnormality. IMPRESSION: No acute cardiopulmonary process. Electronically Signed   By: Jackquline Boxer M.D.   On: 08/22/2023 21:40    Procedures Procedures (including critical care time)  Medications Ordered in UC Medications - No data to display  Initial Impression / Assessment and Plan / UC Course  I have reviewed the triage vital signs and the nursing  notes.  Pertinent labs & imaging results that were available during my care of the patient were reviewed by me and considered in my medical decision making (see chart for details).    EKG without concerning findings. Patient asymptomatic at time of visit- no indication for ED evaluation at this time. Advised to continue to monitor symptoms and she will follow up with her cardiologist. Advised ED evaluation should symptoms return or worsen.   Final Clinical Impressions(s) / UC Diagnoses   Final diagnoses:  Chest discomfort   Discharge Instructions   None    ED Prescriptions   None    PDMP not reviewed this encounter.   Billy Asberry FALCON, PA-C 08/24/23 1141

## 2023-08-25 ENCOUNTER — Other Ambulatory Visit: Payer: Self-pay | Admitting: Nurse Practitioner

## 2023-08-25 ENCOUNTER — Ambulatory Visit: Payer: Self-pay | Admitting: Nurse Practitioner

## 2023-08-25 ENCOUNTER — Ambulatory Visit: Attending: Nurse Practitioner

## 2023-08-25 ENCOUNTER — Ambulatory Visit: Attending: Nurse Practitioner | Admitting: Nurse Practitioner

## 2023-08-25 ENCOUNTER — Encounter: Payer: Self-pay | Admitting: Nurse Practitioner

## 2023-08-25 VITALS — BP 110/80 | HR 92 | Ht 66.0 in | Wt 251.0 lb

## 2023-08-25 DIAGNOSIS — R079 Chest pain, unspecified: Secondary | ICD-10-CM | POA: Insufficient documentation

## 2023-08-25 DIAGNOSIS — I340 Nonrheumatic mitral (valve) insufficiency: Secondary | ICD-10-CM | POA: Insufficient documentation

## 2023-08-25 DIAGNOSIS — R0602 Shortness of breath: Secondary | ICD-10-CM

## 2023-08-25 DIAGNOSIS — R600 Localized edema: Secondary | ICD-10-CM | POA: Diagnosis present

## 2023-08-25 DIAGNOSIS — R002 Palpitations: Secondary | ICD-10-CM

## 2023-08-25 DIAGNOSIS — E876 Hypokalemia: Secondary | ICD-10-CM | POA: Insufficient documentation

## 2023-08-25 DIAGNOSIS — E669 Obesity, unspecified: Secondary | ICD-10-CM

## 2023-08-25 LAB — TROPONIN T: Troponin T (Highly Sensitive): <6 ng/L (ref 0–14)

## 2023-08-25 MED ORDER — POTASSIUM CHLORIDE ER 10 MEQ PO TBCR: EXTENDED_RELEASE_TABLET | ORAL | 1 refills | Status: DC

## 2023-08-25 MED ORDER — FUROSEMIDE 20 MG PO TABS: ORAL_TABLET | ORAL | 1 refills | Status: DC

## 2023-08-25 NOTE — Progress Notes (Unsigned)
 Enrolled for [employer] to mail a ZIO XT long term holter monitor to the patients address on file.   Dr. Harriet Masson to read.

## 2023-08-25 NOTE — Patient Instructions (Addendum)
 Medication Instructions:  START Lasix  20mg  Take 1 tablet daily for 3 days then CHANGE to one tablet every other day  START Potassium 10 meq Take 1 tablet daily for 3 days then CHANGE to one tablet every other day *If you need a refill on your cardiac medications before your next appointment, please call your pharmacy*  Lab Work: TODAY-TROPONIN  If you have labs (blood work) drawn today and your tests are completely normal, you will receive your results only by: MyChart Message (if you have MyChart) OR A paper copy in the mail If you have any lab test that is abnormal or we need to change your treatment, we will call you to review the results.  Testing/Procedures: CALCIUM SCORE  Your physician has requested that you have an exercise tolerance test. For further information please visit [email or web address] Please also follow instruction sheet, as given.  Your physician has requested that you have an echocardiogram. Echocardiography is a painless test that uses sound waves to create images of your heart. It provides your doctor with information about the size and shape of your heart and how well your heart's chambers and valves are working. This procedure takes approximately one hour. There are no restrictions for this procedure. Please do NOT wear cologne, perfume, aftershave, or lotions (deodorant is allowed). Please arrive 15 minutes prior to your appointment time.  Please note: We ask at that you not bring children with you during ultrasound (echo/ vascular) testing. Due to room size and safety concerns, children are not allowed in the ultrasound rooms during exams. Our front office staff cannot provide observation of children in our lobby area while testing is being conducted. An adult accompanying a patient to their appointment will only be allowed in the ultrasound room at the discretion of the ultrasound technician under special circumstances. We apologize for any inconvenience.   ZIO XT-  Long Term Monitor Instructions  Your physician has requested you wear a ZIO patch monitor for 14 days.  This is a single patch monitor. [employer] supplies one patch monitor per enrollment. Additional stickers are not available. Please do not apply patch if you will be having a Nuclear Stress Test,  Echocardiogram, Cardiac CT, MRI, or Chest Xray during the period you would be wearing the  monitor. The patch cannot be worn during these tests. You cannot remove and re-apply the  ZIO XT patch monitor.  Your ZIO patch monitor will be mailed 3 day USPS to your address on file. It may take 3-5 days  to receive your monitor after you have been enrolled.  Once you have received your monitor, please review the enclosed instructions. Your monitor  has already been registered assigning a specific monitor serial # to you.  Billing and Patient Assistance Program Information  We have supplied [employer] with any of your insurance information on file for billing purposes. [employer] offers a sliding scale Patient Assistance Program for patients that do not have  insurance, or whose insurance does not completely cover the cost of the ZIO monitor.  You must apply for the Patient Assistance Program to qualify for this discounted rate.  To apply, please call [employer] at [phone], select option 4, select option 2, ask to apply for  Patient Assistance Program. Meredeth will ask your household income, and how many people  are in your household. They will quote your out-of-pocket cost based on that information.  [employer] will also be able to set up a 25-month, interest-free payment plan if needed.  Applying  the monitor   Shave hair from upper left chest.  Hold abrader disc by orange tab. Rub abrader in 40 strokes over the upper left chest as  indicated in your monitor instructions.  Clean area with 4 enclosed alcohol pads. Let dry.  Apply patch as indicated in monitor instructions. Patch will be placed under  collarbone on left  side of chest with arrow pointing upward.  Rub patch adhesive wings for 2 minutes. Remove white label marked 1. Remove the white  label marked 2. Rub patch adhesive wings for 2 additional minutes.  While looking in a mirror, press and release button in center of patch. A small green light will  flash 3-4 times. This will be your only indicator that the monitor has been turned on.  Do not shower for the first 24 hours. You may shower after the first 24 hours.  Press the button if you feel a symptom. You will hear a small click. Record Date, Time and  Symptom in the Patient Logbook.  When you are ready to remove the patch, follow instructions on the last 2 pages of Patient  Logbook. Stick patch monitor onto the last page of Patient Logbook.  Place Patient Logbook in the blue and white box. Use locking tab on box and tape box closed  securely. The blue and white box has prepaid postage on it. Please place it in the mailbox as  soon as possible. Your physician should have your test results approximately 7 days after the  monitor has been mailed back to [facility].  Call [facility] Customer Care at [phone] if you have questions regarding  your ZIO XT patch monitor. Call them immediately if you see an orange light blinking on your  monitor.  If your monitor falls off in less than 4 days, contact our Monitor department at [phone].  If your monitor becomes loose or falls off after 4 days call [employer] at [phone] for  suggestions on securing your monitor  Follow-Up: At [facility], you and your health needs are our priority.  As part of our continuing mission to provide you with exceptional heart care, our providers are all part of one team.  This team includes your primary Cardiologist (physician) and Advanced Practice Providers or APPs (Physician Assistants and Nurse Practitioners) who all work together to provide you with the care you need,  when you need it.  Your next appointment:   2 month(s)  Provider:   Kardie Tobb, DO  or APP  We recommend signing up for the patient portal called MyChart.  Sign up information is provided on this After Visit Summary.  MyChart is used to connect with patients for Virtual Visits (Telemedicine).  Patients are able to view lab/test results, encounter notes, upcoming appointments, etc.  Non-urgent messages can be sent to your provider as well.   To learn more about what you can do with MyChart, go to [email or web address].   Other Instructions

## 2023-08-25 NOTE — Progress Notes (Signed)
 Cardiology Office Note    Patient Name: [name] Date of Encounter: 08/25/2023  Primary Care Provider:  Ilah Crigler, MD Primary Cardiologist:  Dub Huntsman, DO Primary Electrophysiologist: None   Past Medical History    Past Medical History:  Diagnosis Date   Abnormal uterine bleeding (AUB)    History of abnormal cervical Pap smear    since 2000   History of chlamydia infection 07/2010   treated. hasn't been with that partner since.    History of gestational diabetes    History of gonorrhea    treated in July 2012. hasn't been with that partner since.    History of pregnancy induced hypertension    Iron deficiency anemia due to chronic blood loss    referred by hematology--- dr pauletta chihuahua---   IV iron infusion,  [id number];  12-14-2022;   01-21-2023;  01-28-2023   Mitral regurgitation    Moderate to severe mitral regurgitation 11/06/2022   evalulated by cardiologoy-- dr marla. tobb note in epic 12-24-2022;  per echo 11-07-2022 ef 50-55%,  RVSP 63.9,  mod-sev LAE, mild TR;   pt also was seen by another cardiology in [place] -- Dr FABIENE Caroline note in care everywhere 11-29-2022,  TEE 12-03-2022 , ef 60-65%,  mild-mod MR RVSP normal, normal LAE   Pulmonary HTN (HCC)    Symptomatic anemia    (01-24-2023  pt stated symptoms are chest pain, intermittant heart flutters, sob, weakness, fatigue)  admission in epic 11-06-2022  --- SOB / CP/  vaginal bleeding /  Hg2.6;   transfused 3units PRBC and 2 iron infusions ;  also referred to cardiology due to echo w/ moderate to severe MR unspecified pulm HTN   Uterine fibroid     History of Present Illness  Darlene Hayes is a 38 y.o. female with a PMH of pulmonary HTN, moderate to severe MR, symptomatic anemia, gestational diabetes induced hypertension who presents today for post ED visit for chest pain.  Darlene Hayes followed by Dr. Huntsman since 2024 when she was seen for surgical clearance prior to hysterectomy.  During visit she noted  shortness of breath with fatigue treated for severe anemia secondary to uterine fibroids.  She was started on Lasix  at that time and completed a 2D echo for further evaluation.  She was last seen on 03/13/2023 swelling weight gain.  She continued to have uterine bleeding and had not completed scheduled hysterectomy as previously noted.  Updated 2D echo that showed 50-55% with global hypokinesis, mildly dilated LA mild MVR with no evidence of aortic regurgitation.  She was advised to discontinue Lasix  at that time.    She underwent scheduled hysterectomy that was completed on 03/2023.  She was seen and presented to the ED on 08/21/2023 with right sided chest pain.  She also noted some palpitations with discomfort radiating to her back with shortness of breath.  She also reports some sweating at times with the pressure and fluttering.  She had troponins completed that were flat EKG showing sinus rhythm and no acute changes.  Darlene Hayes presents today for post ED follow-up and complaint of chest pain and shortness of breath. She experiences chest pain and shortness of breath primarily during physical activity. The chest pain is described as a sensation of pressure in the middle of her chest, sometimes accompanied by fluttering, and radiates to her right side and between her shoulder blades. The pain is not crushing or stabbing but more of a pressure.  She has  a history of hypertension and was previously on Lasix , which she took every other day. Her weight was stable while on Lasix . Recently, her potassium levels were found to be low at 3.4 mEq/L, and she was taking potassium supplements with her Lasix .  She experiences palpitations, which she describes as a 'real bad' sensation, with an elevated heart rate during these episodes. Shortness of breath occurs with minimal exertion, such as showering, walking around the house, climbing stairs, and even while talking on the phone. In March, she had an echocardiogram.  Troponin levels checked in the emergency room on August 15 were normal but not completed in the serial.  Family history is significant for cardiac issues. Her maternal grandmother died of a heart attack in her early sixties, and her father, who is 55, was recently hospitalized for heart problems, although no specific issues were identified. Her mother had a heart murmur when she was young but no other cardiac issues. There are no cardiac issues reported in her siblings or uncles. She works from home and reports swelling in her hands, which varies in severity. She is mindful of her salt intake and avoids foods high in sodium. She drinks at least 64 ounces of water daily. She does not consume caffeine or alcohol. Patient denies chest pain, palpitations, dyspnea, PND, orthopnea, nausea, vomiting, dizziness, syncope, edema, weight gain, or early satiety.  Discussed the use of AI scribe software for clinical note transcription with the patient, who gave verbal consent to proceed.  History of Present Illness   Review of Systems  Please see the history of present illness.    All other systems reviewed and are otherwise negative except as noted above.  Physical Exam    Wt Readings from Last 3 Encounters:  08/22/23 242 lb 4.6 oz (109.9 kg)  05/21/23 242 lb 4.8 oz (109.9 kg)  04/01/23 242 lb (109.8 kg)   CD:Uyzmz were no vitals filed for this visit.,There is no height or weight on file to calculate BMI. GEN: Well nourished, well developed in no acute distress Neck: No JVD; No carotid bruits Pulmonary: Clear to auscultation without rales, wheezing or rhonchi  Cardiovascular: Normal rate. Regular rhythm. Normal S1. Normal S2.   Murmurs: There is no murmur.  ABDOMEN: Soft, non-tender, non-distended EXTREMITIES: +1 lower extremity edema and upper extremity edema bilaterally  EKG/LABS/ Recent Cardiac Studies   ECG personally reviewed by me today -none completed today  Risk Assessment/Calculations:           Lab Results  Component Value Date   WBC 5.2 08/22/2023   HGB 13.3 08/22/2023   HCT 40.0 08/22/2023   MCV 94.1 08/22/2023   PLT 214 08/22/2023   Lab Results  Component Value Date   CREATININE 0.75 08/22/2023   BUN 9 08/22/2023   NA 137 08/22/2023   K 3.4 (L) 08/22/2023   CL 103 08/22/2023   CO2 22 08/22/2023   Lab Results  Component Value Date   CHOL 154 07/02/2019   HDL 55 07/02/2019   LDLCALC 90 07/02/2019   TRIG 38 07/02/2019   CHOLHDL 2.8 07/02/2019    Lab Results  Component Value Date   HGBA1C 5.5 07/02/2019   Assessment & Plan    Assessment & Plan  1.  Chest pain: -Intermittent exertional chest pain and dyspnea suggestive of angina or coronary artery disease.  - Risk factors include family cardiac history, preeclampsia, and gestational diabetes. - Order treadmill stress test with informed consent discussed. - Order coronary calcium  score. - Draw troponin level today. - Advise to avoid caffeine, alcohol, and stress.  2.  Mild to moderate MR: - Previous 2D echo completed in 03/2023 with low normal EF and mild MR - Reports shortness of breath with minimal exertion and new peripheral swelling in hands and lower extremities - Repeat 2D echo for further evaluation of structural heart  3.  Palpitations: -Palpitations with tachycardia and occasional chest pain suggestive of arrhythmia. - Order 7-day event monitor. - Advise to avoid caffeine, alcohol, and stress.  4.  Hypokalemia: Low potassium level likely due to diuretic use. - Prescribe potassium supplement: 20 mEq with Lasix  for 3 days, then 10 mEq with every other day Lasix  dose. - Recheck kidney function in one week.  5. Peripheral edema: Edema in hands possibly due to fluid retention, hypertension, or diet. - Restart Lasix  20 mg for 3 days, then 20 mg every other day. - Advise to monitor salt intake and avoid high-sodium foods. - Encourage hydration with at least 64 ounces of water daily.        Disposition: Follow-up with Kardie Tobb, DO or APP in 2 months Informed Consent   Shared Decision Making/Informed Consent The risks [chest pain, shortness of breath, cardiac arrhythmias, dizziness, blood pressure fluctuations, myocardial infarction, stroke/transient ischemic attack, and life-threatening complications (estimated to be 1 in 10,000)], benefits (risk stratification, diagnosing coronary artery disease, treatment guidance) and alternatives of an exercise tolerance test were discussed in detail with Darlene Hayes and she agrees to proceed.      Signed, Wyn Raddle, Jackee Shove, NP 08/25/2023, 12:38 PM [facility]

## 2023-09-01 ENCOUNTER — Ambulatory Visit (HOSPITAL_COMMUNITY)
Admission: RE | Admit: 2023-09-01 | Discharge: 2023-09-01 | Disposition: A | Discharge status: Home / Self Care | Source: Ambulatory Visit | Attending: Nurse Practitioner | Admitting: Nurse Practitioner

## 2023-09-01 ENCOUNTER — Telehealth: Payer: Self-pay | Admitting: Cardiology

## 2023-09-01 DIAGNOSIS — I340 Nonrheumatic mitral (valve) insufficiency: Secondary | ICD-10-CM | POA: Diagnosis not present

## 2023-09-01 DIAGNOSIS — R079 Chest pain, unspecified: Secondary | ICD-10-CM | POA: Diagnosis present

## 2023-09-01 LAB — ECHOCARDIOGRAM COMPLETE
Area-P 1/2: 4.49 cm2
S' Lateral: 3.34 cm

## 2023-09-01 NOTE — Telephone Encounter (Signed)
 Forms were received on ___8/25/25__ from patient. Placed in MD's box.  Made patient aware of 7-14 business day turnaround time and payment of $29, cash check or money order. Patient to come to pay on 09/02/23

## 2023-09-02 NOTE — Telephone Encounter (Signed)
 Patient came in today and paid the $29 form fee.  Form in Dr. Ginette box.

## 2023-09-11 ENCOUNTER — Encounter (HOSPITAL_COMMUNITY): Payer: Self-pay | Admitting: *Deleted

## 2023-09-15 ENCOUNTER — Other Ambulatory Visit (HOSPITAL_BASED_OUTPATIENT_CLINIC_OR_DEPARTMENT_OTHER)

## 2023-09-18 ENCOUNTER — Other Ambulatory Visit: Payer: Self-pay | Admitting: Nurse Practitioner

## 2023-09-19 ENCOUNTER — Telehealth: Payer: Self-pay | Admitting: Cardiology

## 2023-09-19 ENCOUNTER — Ambulatory Visit (HOSPITAL_COMMUNITY)
Admission: RE | Admit: 2023-09-19 | Discharge: 2023-09-19 | Disposition: A | Discharge status: Home / Self Care | Source: Ambulatory Visit | Attending: Nurse Practitioner | Admitting: Nurse Practitioner

## 2023-09-19 DIAGNOSIS — I340 Nonrheumatic mitral (valve) insufficiency: Secondary | ICD-10-CM | POA: Diagnosis present

## 2023-09-19 DIAGNOSIS — R079 Chest pain, unspecified: Secondary | ICD-10-CM | POA: Diagnosis present

## 2023-09-19 LAB — EXERCISE TOLERANCE TEST
Angina Index: 0
Duke Treadmill Score: 6
Estimated workload: 7
Exercise duration (min): 6 min
Exercise duration (sec): 0 s
MPHR: 181 {beats}/min
Peak HR: 162 {beats}/min
Percent HR: 89 %
RPE: 18
Rest HR: 69 {beats}/min
ST Depression (mm): 0 mm

## 2023-09-19 NOTE — Telephone Encounter (Signed)
 Spoke w/pt informing her that, per Dr. Sheena, there is no disability diagnosis (normal, recent echo) regarding FMLA.  Pt picked up paperwork & refund (cash).  JB, 09-19-23

## 2023-09-30 NOTE — Telephone Encounter (Signed)
 Form was not completed by provider. Pt picked up form on 9/12 and received a refund.

## 2023-10-07 ENCOUNTER — Ambulatory Visit: Payer: Self-pay | Admitting: Nurse Practitioner

## 2023-10-07 DIAGNOSIS — R002 Palpitations: Secondary | ICD-10-CM | POA: Diagnosis not present

## 2023-10-07 DIAGNOSIS — I340 Nonrheumatic mitral (valve) insufficiency: Secondary | ICD-10-CM

## 2023-10-07 DIAGNOSIS — R0602 Shortness of breath: Secondary | ICD-10-CM | POA: Diagnosis not present

## 2023-10-07 DIAGNOSIS — R079 Chest pain, unspecified: Secondary | ICD-10-CM

## 2023-11-06 ENCOUNTER — Telehealth: Payer: Self-pay | Admitting: Radiology

## 2023-11-06 NOTE — Telephone Encounter (Signed)
 [facility] received notification of a Zio monitor advisory is related to an issue with the electrical component in the zio monitor that may have caused this patient zio monitor to not record for the full 14 days. Appears patient wore her monitor for approximately 4 days. Will ask ordering provider if she would like to order a new monitor for the patient.

## 2023-11-10 NOTE — Telephone Encounter (Signed)
 [employer] has been contacted and will send a new monitor at no cost to the patient

## 2023-11-14 ENCOUNTER — Encounter: Payer: Self-pay | Admitting: *Deleted

## 2023-11-18 ENCOUNTER — Ambulatory Visit: Attending: Cardiology | Admitting: Cardiology

## 2023-11-18 ENCOUNTER — Encounter: Payer: Self-pay | Admitting: Cardiology

## 2023-11-18 VITALS — BP 125/82 | HR 73 | Ht 66.0 in | Wt 246.8 lb

## 2023-11-18 DIAGNOSIS — E669 Obesity, unspecified: Secondary | ICD-10-CM | POA: Diagnosis present

## 2023-11-18 DIAGNOSIS — Z79899 Other long term (current) drug therapy: Secondary | ICD-10-CM | POA: Diagnosis not present

## 2023-11-18 DIAGNOSIS — I340 Nonrheumatic mitral (valve) insufficiency: Secondary | ICD-10-CM | POA: Diagnosis present

## 2023-11-18 MED ORDER — POTASSIUM CHLORIDE CRYS ER 10 MEQ PO TBCR: 10.0000 meq | EXTENDED_RELEASE_TABLET | Freq: Every day | ORAL | 3 refills | Status: AC | PRN

## 2023-11-18 MED ORDER — FUROSEMIDE 20 MG PO TABS: 20.0000 mg | ORAL_TABLET | Freq: Every day | ORAL | 3 refills | Status: AC | PRN

## 2023-11-18 NOTE — Patient Instructions (Signed)
 Medication Instructions:  Your physician has recommended you make the following change in your medication:  As needed once daily :  Lasix  20 mg as needed daily for lower extremity swelling  As needed once daily : Potassium 10 mEq please take when taking Lasix   *If you need a refill on your cardiac medications before your next appointment, please call your pharmacy*  Lab Work: CMET, Mag If you have labs (blood work) drawn today and your tests are completely normal, you will receive your results only by: MyChart Message (if you have MyChart) OR A paper copy in the mail If you have any lab test that is abnormal or we need to change your treatment, we will call you to review the results.  Follow-Up: At [facility], you and your health needs are our priority.  As part of our continuing mission to provide you with exceptional heart care, our providers are all part of one team.  This team includes your primary Cardiologist (physician) and Advanced Practice Providers or APPs (Physician Assistants and Nurse Practitioners) who all work together to provide you with the care you need, when you need it.  Your next appointment:   1 year(s)  Provider:   Kardie Tobb, DO

## 2023-11-18 NOTE — Progress Notes (Unsigned)
 Cardiology Office Note:    Date:  11/19/2023   ID:  Darlene Hayes, DOB Jul 25, 1985, MRN [id number]  PCP:  Ilah Crigler, MD  Cardiologist:  Dub Huntsman, DO  Electrophysiologist:  None   Referring MD: Ilah Crigler, MD    I am doing well- I lost some weight   History of Present Illness:    Darlene Hayes is a 38 y.o. female with a hx of moderate to severe left presentation in the setting of anemia (hemoglobin 6) which has improved, most recent echocardiogram showed mild mitral regurgitation, gestational diabetes, gestational hypertension, obesity,  She recently underwent total hysterectomy with bilateral salpingectomy and is experiencing symptoms from menopause.  She had questions about getting to see her OB or primary care doctor.   Past Medical History:  Diagnosis Date   Abnormal uterine bleeding (AUB)    History of abnormal cervical Pap smear    since 2000   History of chlamydia infection 07/2010   treated. hasn't been with that partner since.    History of gestational diabetes    History of gonorrhea    treated in July 2012. hasn't been with that partner since.    History of pregnancy induced hypertension    Iron deficiency anemia due to chronic blood loss    referred by hematology--- dr pauletta chihuahua---   IV iron infusion,  [id number];  12-14-2022;   01-21-2023;  01-28-2023   Mitral regurgitation    Symptomatic anemia    (01-24-2023  pt stated symptoms are chest pain, intermittant heart flutters, sob, weakness, fatigue)  admission in epic 11-06-2022  --- SOB / CP/  vaginal bleeding /  Hg2.6;   transfused 3units PRBC and 2 iron infusions ;  also referred to cardiology due to echo w/ moderate to severe MR unspecified pulm HTN   Uterine fibroid     Past Surgical History:  Procedure Laterality Date   CYSTOSCOPY N/A 04/01/2023   Procedure: CYSTOSCOPY;  Surgeon: Izell Harari, MD;  Location: [facility] OR;  Service: Gynecology;  Laterality: N/A;   DILATION AND CURETTAGE OF  UTERUS  12/2016   EAB done in [place]   TEE WITHOUT CARDIOVERSION  12/03/2022   [place] , [place] by dr r. blanco   TOTAL LAPAROSCOPIC HYSTERECTOMY WITH SALPINGECTOMY Bilateral 04/01/2023   Procedure: TOTAL LAPAROSCOPIC HYSTERECTOMY WITH BILATERAL SALPINGECTOMY;  Surgeon: Izell Harari, MD;  Location: [facility] OR;  Service: Gynecology;  Laterality: Bilateral;    Current Medications: Current Meds  Medication Sig   furosemide  (LASIX ) 20 MG tablet Take 1 tablet (20 mg total) by mouth daily as needed.   ibuprofen  (ADVIL ) 600 MG tablet Take 1 tablet (600 mg total) by mouth every 6 (six) hours as needed.   potassium chloride  (KLOR-CON  M) 10 MEQ tablet Take 1 tablet (10 mEq total) by mouth daily as needed (lower extremity swelling).     Allergies:   Patient has no known allergies.   Social History   Socioeconomic History   Marital status: Single    Spouse name: Not on file   Number of children: Not on file   Years of education: Not on file   Highest education level: Not on file  Occupational History   Not on file  Tobacco Use   Smoking status: Former    Types: Cigarettes   Smokeless tobacco: Never   Tobacco comments:    01-24-2023  pt stated quit smoking 10/ 2024,  started age 47  Vaping Use   Vaping status: Former   Devices:  quit  2023  Substance and Sexual Activity   Alcohol use: No   Drug use: Not Currently    Types: Marijuana    Comment: 01-24-2023  per pt last smoked marijuana 10/ 2024   Sexual activity: Not on file  Other Topics Concern   Not on file  Social History Narrative   Not on file   Social Drivers of Health   Financial Resource Strain: Not on file  Food Insecurity: No Food Insecurity (04/02/2023)   Hunger Vital Sign    Worried About Running Out of Food in the Last Year: Never true    Ran Out of Food in the Last Year: Never true  Transportation Needs: No Transportation Needs (04/02/2023)   PRAPARE - Administrator, Civil Service (Medical): No    Lack  of Transportation (Non-Medical): No  Physical Activity: Not on file  Stress: Not on file  Social Connections: Not on file     Family History: The patient's family history includes Diabetes in her maternal grandmother; Hypertension in her mother and sister; Miscarriages / Stillbirths in her sister; Stroke in her maternal grandmother.  ROS:   Review of Systems  Constitution: Negative for decreased appetite, fever and weight gain.  HENT: Negative for congestion, ear discharge, hoarse voice and sore throat.   Eyes: Negative for discharge, redness, vision loss in right eye and visual halos.  Cardiovascular: Negative for chest pain, dyspnea on exertion, leg swelling, orthopnea and palpitations.  Respiratory: Negative for cough, hemoptysis, shortness of breath and snoring.   Endocrine: Negative for heat intolerance and polyphagia.  Hematologic/Lymphatic: Negative for bleeding problem. Does not bruise/bleed easily.  Skin: Negative for flushing, nail changes, rash and suspicious lesions.  Musculoskeletal: Negative for arthritis, joint pain, muscle cramps, myalgias, neck pain and stiffness.  Gastrointestinal: Negative for abdominal pain, bowel incontinence, diarrhea and excessive appetite.  Genitourinary: Negative for decreased libido, genital sores and incomplete emptying.  Neurological: Negative for brief paralysis, focal weakness, headaches and loss of balance.  Psychiatric/Behavioral: Negative for altered mental status, depression and suicidal ideas.  Allergic/Immunologic: Negative for HIV exposure and persistent infections.    EKGs/Labs/Other Studies Reviewed:    The following studies were reviewed today:   EKG:  The ekg ordered today demonstrates   Recent Labs: 08/22/2023: B Natriuretic Peptide 7.0; Hemoglobin 13.3; Platelets 214 11/18/2023: ALT 11; BUN 7; Creatinine, Ser 0.84; Magnesium 2.2; Potassium CANCELED; Sodium 137  Recent Lipid Panel    Component Value Date/Time   CHOL 154  07/02/2019 1056   TRIG 38 07/02/2019 1056   HDL 55 07/02/2019 1056   CHOLHDL 2.8 07/02/2019 1056   LDLCALC 90 07/02/2019 1056    Physical Exam:    VS:  BP 125/82 (BP Location: Left Arm, Patient Position: Sitting, Cuff Size: Large)   Pulse 73   Ht 5' 6 (1.676 m)   Wt 246 lb 12.8 oz (111.9 kg)   LMP 03/19/2023 (Approximate) Comment: continuously bleeding since Oct 2024  SpO2 98%   BMI 39.83 kg/m     Wt Readings from Last 3 Encounters:  11/18/23 246 lb 12.8 oz (111.9 kg)  08/25/23 251 lb (113.9 kg)  08/22/23 242 lb 4.6 oz (109.9 kg)     GEN: Well nourished, well developed in no acute distress HEENT: Normal NECK: No JVD; No carotid bruits LYMPHATICS: No lymphadenopathy CARDIAC: S1S2 noted,RRR, no murmurs, rubs, gallops RESPIRATORY:  Clear to auscultation without rales, wheezing or rhonchi  ABDOMEN: Soft, non-tender, non-distended, +bowel sounds, no guarding. EXTREMITIES:  No edema, No cyanosis, no clubbing MUSCULOSKELETAL:  No deformity  SKIN: Warm and dry NEUROLOGIC:  Alert and oriented x 3, non-focal PSYCHIATRIC:  Normal affect, good insight  ASSESSMENT:    1. Medication management   2. Nonrheumatic mitral valve regurgitation   3. Obesity (BMI 30-39.9)    PLAN:    She appears to be doing well on cardiovascular standpoint.  We discussed at this point I think it would be beneficial to change her Lasix  to as needed.  Her mitral regurgitation has improved significantly she is now mild.  She is status post hysterectomy which have put her into premature menopause.  From a cardiovascular standpoint she can be treated with hormonal therapy.  The patient is in agreement with the above plan. The patient left the office in stable condition.  The patient will follow up in   Medication Adjustments/Labs and Tests Ordered: Current medicines are reviewed at length with the patient today.  Concerns regarding medicines are outlined above.  Orders Placed This Encounter  Procedures    Comp Met (CMET)   Magnesium   Meds ordered this encounter  Medications   furosemide  (LASIX ) 20 MG tablet    Sig: Take 1 tablet (20 mg total) by mouth daily as needed.    Dispense:  25 tablet    Refill:  3   potassium chloride  (KLOR-CON  M) 10 MEQ tablet    Sig: Take 1 tablet (10 mEq total) by mouth daily as needed (lower extremity swelling).    Dispense:  25 tablet    Refill:  3    Patient Instructions  Medication Instructions:  Your physician has recommended you make the following change in your medication:  As needed once daily :  Lasix  20 mg as needed daily for lower extremity swelling  As needed once daily : Potassium 10 mEq please take when taking Lasix   *If you need a refill on your cardiac medications before your next appointment, please call your pharmacy*  Lab Work: CMET, Mag If you have labs (blood work) drawn today and your tests are completely normal, you will receive your results only by: MyChart Message (if you have MyChart) OR A paper copy in the mail If you have any lab test that is abnormal or we need to change your treatment, we will call you to review the results.  Follow-Up: At [facility], you and your health needs are our priority.  As part of our continuing mission to provide you with exceptional heart care, our providers are all part of one team.  This team includes your primary Cardiologist (physician) and Advanced Practice Providers or APPs (Physician Assistants and Nurse Practitioners) who all work together to provide you with the care you need, when you need it.  Your next appointment:   1 year(s)  Provider:   Harneet Noblett, DO      Adopting a Healthy Lifestyle.  Know what a healthy weight is for you (roughly BMI <25) and aim to maintain this   Aim for 7+ servings of fruits and vegetables daily   65-80+ fluid ounces of water or unsweet tea for healthy kidneys   Limit to max 1 drink of alcohol per day; avoid smoking/tobacco   Limit  animal fats in diet for cholesterol and heart health - choose grass fed whenever available   Avoid highly processed foods, and foods high in saturated/trans fats   Aim for low stress - take time to unwind and care for your mental health  Aim for 150 min of moderate intensity exercise weekly for heart health, and weights twice weekly for bone health   Aim for 7-9 hours of sleep daily   When it comes to diets, agreement about the perfect plan isnt easy to find, even among the experts. Experts at the [facility] [employer] developed an idea known as the Healthy Eating Plate. Just imagine a plate divided into logical, healthy portions.   The emphasis is on diet quality:   Load up on vegetables and fruits - one-half of your plate: Aim for color and variety, and remember that potatoes dont count.   Go for whole grains - one-quarter of your plate: Whole wheat, barley, wheat berries, quinoa, oats, brown rice, and foods made with them. If you want pasta, go with whole wheat pasta.   Protein power - one-quarter of your plate: Fish, chicken, beans, and nuts are all healthy, versatile protein sources. Limit red meat.   The diet, however, does go beyond the plate, offering a few other suggestions.   Use healthy plant oils, such as olive, canola, soy, corn, sunflower and peanut. Check the labels, and avoid partially hydrogenated oil, which have unhealthy trans fats.   If youre thirsty, drink water. Coffee and tea are good in moderation, but skip sugary drinks and limit milk and dairy products to one or two daily servings.   The type of carbohydrate in the diet is more important than the amount. Some sources of carbohydrates, such as vegetables, fruits, whole grains, and beans-are healthier than others.   Finally, stay active  Signed, Dub Huntsman, DO  11/19/2023 10:29 AM    [facility]

## 2023-11-19 ENCOUNTER — Encounter: Payer: Self-pay | Admitting: Cardiology

## 2023-11-19 LAB — COMPREHENSIVE METABOLIC PANEL WITH GFR
ALT: 11 IU/L (ref 0–32)
AST: 28 IU/L (ref 0–40)
Albumin: 4.5 g/dL (ref 3.9–4.9)
Alkaline Phosphatase: 128 IU/L — ABNORMAL HIGH (ref 41–116)
BUN/Creatinine Ratio: 8 — ABNORMAL LOW (ref 9–23)
BUN: 7 mg/dL (ref 6–20)
Bilirubin Total: 0.4 mg/dL (ref 0.0–1.2)
CO2: 20 mmol/L (ref 20–29)
Calcium: 9.2 mg/dL (ref 8.7–10.2)
Chloride: 104 mmol/L (ref 96–106)
Creatinine, Ser: 0.84 mg/dL (ref 0.57–1.00)
Globulin, Total: 3.5 g/dL (ref 1.5–4.5)
Glucose: 70 mg/dL (ref 70–99)
Sodium: 137 mmol/L (ref 134–144)
Total Protein: 8 g/dL (ref 6.0–8.5)
eGFR: 91 mL/min/1.73 (ref >59)

## 2023-11-19 LAB — MAGNESIUM: Magnesium: 2.2 mg/dL (ref 1.6–2.3)

## 2023-11-24 ENCOUNTER — Ambulatory Visit: Payer: Self-pay | Admitting: Cardiology

## 2023-12-21 ENCOUNTER — Other Ambulatory Visit: Payer: Self-pay

## 2023-12-21 ENCOUNTER — Emergency Department (HOSPITAL_BASED_OUTPATIENT_CLINIC_OR_DEPARTMENT_OTHER)
Admission: EM | Admit: 2023-12-21 | Discharge: 2023-12-22 | Disposition: A | Attending: Emergency Medicine | Admitting: Emergency Medicine

## 2023-12-21 ENCOUNTER — Emergency Department (HOSPITAL_BASED_OUTPATIENT_CLINIC_OR_DEPARTMENT_OTHER)

## 2023-12-21 DIAGNOSIS — R1013 Epigastric pain: Secondary | ICD-10-CM | POA: Diagnosis present

## 2023-12-21 DIAGNOSIS — R079 Chest pain, unspecified: Secondary | ICD-10-CM | POA: Diagnosis not present

## 2023-12-21 LAB — CBC
HCT: 38.4 % (ref 36.0–46.0)
Hemoglobin: 13.1 g/dL (ref 12.0–15.0)
MCH: 31.7 pg (ref 26.0–34.0)
MCHC: 34.1 g/dL (ref 30.0–36.0)
MCV: 93 fL (ref 80.0–100.0)
Platelets: 216 K/uL (ref 150–400)
RBC: 4.13 MIL/uL (ref 3.87–5.11)
RDW: 13.9 % (ref 11.5–15.5)
WBC: 5.3 K/uL (ref 4.0–10.5)
nRBC: 0 % (ref 0.0–0.2)

## 2023-12-21 LAB — URINALYSIS, ROUTINE W REFLEX MICROSCOPIC
Bacteria, UA: NONE SEEN
Bilirubin Urine: NEGATIVE
Glucose, UA: NEGATIVE mg/dL
Hgb urine dipstick: NEGATIVE
Ketones, ur: NEGATIVE mg/dL
Leukocytes,Ua: NEGATIVE
Nitrite: NEGATIVE
Protein, ur: 30 mg/dL — AB
Specific Gravity, Urine: >1.046 — ABNORMAL HIGH (ref 1.005–1.030)
pH: 7.5 (ref 5.0–8.0)

## 2023-12-21 LAB — TROPONIN T, HIGH SENSITIVITY
Troponin T High Sensitivity: <15 ng/L (ref 0–19)
Troponin T High Sensitivity: <15 ng/L (ref 0–19)

## 2023-12-21 LAB — COMPREHENSIVE METABOLIC PANEL WITH GFR
ALT: 11 U/L (ref 0–44)
AST: 18 U/L (ref 15–41)
Albumin: 4.2 g/dL (ref 3.5–5.0)
Alkaline Phosphatase: 123 U/L (ref 38–126)
Anion gap: 12 (ref 5–15)
BUN: 6 mg/dL (ref 6–20)
CO2: 24 mmol/L (ref 22–32)
Calcium: 9.9 mg/dL (ref 8.9–10.3)
Chloride: 103 mmol/L (ref 98–111)
Creatinine, Ser: 0.69 mg/dL (ref 0.44–1.00)
GFR, Estimated: >60 mL/min (ref >60)
Glucose, Bld: 94 mg/dL (ref 70–99)
Potassium: 3.6 mmol/L (ref 3.5–5.1)
Sodium: 138 mmol/L (ref 135–145)
Total Bilirubin: 0.4 mg/dL (ref 0.0–1.2)
Total Protein: 8 g/dL (ref 6.5–8.1)

## 2023-12-21 LAB — LIPASE, BLOOD: Lipase: 19 U/L (ref 11–51)

## 2023-12-21 LAB — RESP PANEL BY RT-PCR (RSV, FLU A&B, COVID)  RVPGX2
Influenza A by PCR: NEGATIVE
Influenza B by PCR: NEGATIVE
Resp Syncytial Virus by PCR: NEGATIVE
SARS Coronavirus 2 by RT PCR: NEGATIVE

## 2023-12-21 MED ORDER — HYDROMORPHONE HCL 1 MG/ML IJ SOLN
1.0000 mg | Freq: Once | INTRAMUSCULAR | Status: AC
  Administered 2023-12-21: 1 mg via INTRAVENOUS
  Filled 2023-12-21: qty 1

## 2023-12-21 MED ORDER — FENTANYL CITRATE (PF) 50 MCG/ML IJ SOSY
50.0000 ug | PREFILLED_SYRINGE | Freq: Once | INTRAMUSCULAR | Status: AC
  Administered 2023-12-21: 50 ug via INTRAVENOUS
  Filled 2023-12-21: qty 1

## 2023-12-21 MED ORDER — IOHEXOL 350 MG/ML SOLN
100.0000 mL | Freq: Once | INTRAVENOUS | Status: AC | PRN
  Administered 2023-12-21: 100 mL via INTRAVENOUS

## 2023-12-21 MED ORDER — FAMOTIDINE IN NACL 20-0.9 MG/50ML-% IV SOLN
20.0000 mg | Freq: Once | INTRAVENOUS | Status: AC
  Administered 2023-12-21: 20 mg via INTRAVENOUS
  Filled 2023-12-21: qty 50

## 2023-12-21 MED ORDER — ONDANSETRON HCL 4 MG/2ML IJ SOLN
4.0000 mg | Freq: Once | INTRAMUSCULAR | Status: AC
  Administered 2023-12-21: 4 mg via INTRAVENOUS
  Filled 2023-12-21: qty 2

## 2023-12-21 MED ORDER — IOHEXOL 300 MG/ML  SOLN
100.0000 mL | Freq: Once | INTRAMUSCULAR | Status: AC | PRN
  Administered 2023-12-21: 100 mL via INTRAVENOUS

## 2023-12-21 MED ORDER — LIDOCAINE VISCOUS HCL 2 % MT SOLN
15.0000 mL | Freq: Once | OROMUCOSAL | Status: AC
  Administered 2023-12-21: 15 mL via ORAL

## 2023-12-21 MED ORDER — ALUM & MAG HYDROXIDE-SIMETH 200-200-20 MG/5ML PO SUSP
30.0000 mL | Freq: Once | ORAL | Status: AC
  Administered 2023-12-21: 30 mL via ORAL

## 2023-12-21 NOTE — Discharge Instructions (Signed)
 Your symptoms could be due to gastric reflux or an esophageal spasm.  They resolved after pain medication in the emergency department.  Your workup to include EKG, CT imaging of the chest abdomen pelvis was overall reassuring in addition to laboratory evaluation.  Please return to the emergency department for any severe worsening symptoms otherwise follow-up with your primary care provider for continued outpatient management.

## 2023-12-21 NOTE — ED Notes (Signed)
 Pt has had urine cup, reminded pt that urine sample is needed and helped to get up to ambulate to the bathroom to void.  Pt with back pain is asking for flu test, pt swabbed

## 2023-12-21 NOTE — ED Triage Notes (Signed)
 Reports central abd pain that rads to back.  Reports that EMS was called and patient refused transport.  States pain is a stabbing pain  all the way through  Denies n/v/d.

## 2023-12-21 NOTE — ED Provider Notes (Incomplete Revision)
 [place] EMERGENCY DEPARTMENT AT [facility] Provider Note   CSN: [id number] Arrival date & time: 12/21/23  1731     Patient presents with: Abdominal Pain   Darlene Hayes is a 38 y.o. female.    Abdominal Pain Associated symptoms: chest pain      38 year old female with medical history significant for obesity, cannabis use, uterine fibroids presenting to the emergency department with severe epigastric abdominal pain and chest pain radiating to the back.  The patient had acute onset of pain earlier today, describes it as a stabbing pain that radiates all the way through..  She denies any nausea, vomiting, diarrhea.  No fevers or chills.  Pain is 10 out of 10 in severity on my initial evaluation.  Prior to Admission medications  Medication Sig Start Date End Date Taking? Authorizing Provider  furosemide  (LASIX ) 20 MG tablet Take 1 tablet (20 mg total) by mouth daily as needed. 11/18/23 02/16/24  Tobb, Kardie, DO  ibuprofen  (ADVIL ) 600 MG tablet Take 1 tablet (600 mg total) by mouth every 6 (six) hours as needed. 04/02/23   Izell Harari, MD  Multiple Vitamins-Minerals (MULTIVITAMIN WOMEN PO) Take 1 capsule by mouth daily. Patient not taking: Reported on 11/18/2023    [provider]  OVER THE COUNTER MEDICATION Take 2 tablets by mouth daily. [employer] Iron Blood Builder Patient not taking: Reported on 11/18/2023    [provider]  potassium chloride  (KLOR-CON  M) 10 MEQ tablet Take 1 tablet (10 mEq total) by mouth daily as needed (lower extremity swelling). 11/18/23 02/16/24  Tobb, Kardie, DO  simethicone  (MYLICON) 80 MG chewable tablet Chew 1 tablet (80 mg total) by mouth 3 (three) times daily for 3 days. Patient not taking: Reported on 11/18/2023 04/02/23 08/25/23  Izell Harari, MD    Allergies: Patient has no known allergies.    Review of Systems  Cardiovascular:  Positive for chest pain.  Gastrointestinal:  Positive for abdominal pain.   All other systems reviewed and are negative.   Updated Vital Signs BP 131/84   Pulse 77   Temp 98.9 F (37.2 C) (Oral)   Resp 14   LMP 03/19/2023 Comment: continuously bleeding since Oct 2024  SpO2 100%   Physical Exam Vitals and nursing note reviewed.  Constitutional:      General: She is in acute distress.     Appearance: She is well-developed. She is ill-appearing.     Comments: In acute distress, doubled over in severe pain  HENT:     Head: Normocephalic and atraumatic.  Eyes:     Conjunctiva/sclera: Conjunctivae normal.  Cardiovascular:     Rate and Rhythm: Normal rate and regular rhythm.     Heart sounds: No murmur heard. Pulmonary:     Effort: Pulmonary effort is normal. No respiratory distress.     Breath sounds: Normal breath sounds.  Abdominal:     Palpations: Abdomen is soft.     Tenderness: There is no abdominal tenderness.  Musculoskeletal:        General: No swelling.     Cervical back: Neck supple.  Skin:    General: Skin is warm and dry.     Capillary Refill: Capillary refill takes less than 2 seconds.  Neurological:     Mental Status: She is alert.  Psychiatric:        Mood and Affect: Mood normal.     (all labs ordered are listed, but only abnormal results are displayed) Labs Reviewed  URINALYSIS, ROUTINE  W REFLEX MICROSCOPIC - Abnormal; Notable for the following components:      Result Value   Specific Gravity, Urine >1.046 (*)    Protein, ur 30 (*)    All other components within normal limits  RESP PANEL BY RT-PCR (RSV, FLU A&B, COVID)  RVPGX2  LIPASE, BLOOD  COMPREHENSIVE METABOLIC PANEL WITH GFR  CBC  TROPONIN T, HIGH SENSITIVITY  TROPONIN T, HIGH SENSITIVITY    EKG: None  Radiology: CT Angio Chest/Abd/Pel for Dissection W and/or Wo Contrast Result Date: 12/21/2023 EXAM: CTA CHEST, ABDOMEN AND PELVIS WITHOUT AND WITH CONTRAST 12/21/2023 08:52:19 PM TECHNIQUE: CTA of the chest was performed without and with the administration of  intravenous contrast. CTA of the abdomen and pelvis was performed without and with the administration of intravenous contrast. 100 mL of iohexol  (OMNIPAQUE ) 350 MG/ML injection was administered. Multiplanar reformatted images are provided for review. MIP images are provided for review. Automated exposure control, iterative reconstruction, and/or weight based adjustment of the mA/kV was utilized to reduce the radiation dose to as low as reasonably achievable. COMPARISON: CT abdomen and pelvis earlier today. CLINICAL HISTORY: Acute aortic syndrome (AAS) suspected. FINDINGS: VASCULATURE: AORTA: No thoracoabdominal aortic aneurysm or dissection. PULMONARY ARTERIES: No central pulmonary embolism to the lobar level. GREAT VESSELS OF AORTIC ARCH: No acute finding. No dissection. No arterial occlusion or significant stenosis. CELIAC TRUNK: No acute finding. No occlusion or significant stenosis. SUPERIOR MESENTERIC ARTERY: No acute finding. No occlusion or significant stenosis. INFERIOR MESENTERIC ARTERY: No acute finding. No occlusion or significant stenosis. RENAL ARTERIES: No acute finding. No occlusion or significant stenosis. ILIAC ARTERIES: No acute finding. No occlusion or significant stenosis. CHEST: MEDIASTINUM: No mediastinal lymphadenopathy. The heart and pericardium demonstrate no acute abnormality. LUNGS AND PLEURA: The lungs are without acute process. No focal consolidation or pulmonary edema. No evidence of pleural effusion or pneumothorax. THORACIC BONES AND SOFT TISSUES: No acute bone or soft tissue abnormality. ABDOMEN AND PELVIS: LIVER: The liver is unremarkable. GALLBLADDER AND BILE DUCTS: Gallbladder is unremarkable. No biliary ductal dilatation. SPLEEN: The spleen is unremarkable. PANCREAS: The pancreas is unremarkable. ADRENAL GLANDS: Bilateral adrenal glands demonstrate no acute abnormality. KIDNEYS, URETERS AND BLADDER: Excretory contrast in the bilateral renal collecting systems and a bladder. GI  AND BOWEL: Stomach and duodenal sweep demonstrate no acute abnormality. There is no bowel obstruction. No abnormal bowel wall thickening or distension. REPRODUCTIVE: Status post hysterectomy. PERITONEUM AND RETROPERITONEUM: No ascites or free air. LYMPH NODES: No lymphadenopathy. ABDOMINAL BONES AND SOFT TISSUES: No acute abnormality of the bones. No acute soft tissue abnormality. IMPRESSION: 1. No thoracoabdominal aortic aneurysm or dissection. 2. No central pulmonary embolism. 3. No acute findings in the chest, abdomen, or pelvis. Electronically signed by: Pinkie Pebbles MD 12/21/2023 09:03 PM EST RP Workstation: [id number]   CT ABDOMEN PELVIS W CONTRAST Result Date: 12/21/2023 CLINICAL DATA:  Abdominal pain. EXAM: CT ABDOMEN AND PELVIS WITH CONTRAST TECHNIQUE: Multidetector CT imaging of the abdomen and pelvis was performed using the standard protocol following bolus administration of intravenous contrast. RADIATION DOSE REDUCTION: This exam was performed according to the departmental dose-optimization program which includes automated exposure control, adjustment of the mA and/or kV according to patient size and/or use of iterative reconstruction technique. CONTRAST:  OMNIPAQUE  IOHEXOL  300 MG/ML  SOLN COMPARISON:  None Available. FINDINGS: Lower chest: No acute abnormality. Hepatobiliary: No focal liver abnormality is seen. No gallstones, gallbladder wall thickening, or biliary dilatation. Pancreas: Unremarkable. No pancreatic ductal dilatation or surrounding inflammatory changes. Spleen: Normal  in size without focal abnormality. Adrenals/Urinary Tract: The adrenal glands are within normal limits. The kidneys enhance symmetrically. No renal calculus or hydronephrosis bilaterally. The bladder is within normal limits. Stomach/Bowel: The stomach is within normal limits. No bowel obstruction, free air, or pneumatosis is seen. Appendix is not definitely seen. Vascular/Lymphatic: No significant vascular  findings are present. No enlarged abdominal or pelvic lymph nodes. Reproductive: Status post hysterectomy. No adnexal masses. Other: No abdominopelvic ascites. Musculoskeletal: No acute osseous abnormality. IMPRESSION: No acute intra-abdominal process. Electronically Signed   By: Leita Birmingham M.D.   On: 12/21/2023 18:33     Procedures   Medications Ordered in the ED  alum & mag hydroxide-simeth (MAALOX/MYLANTA) 200-200-20 MG/5ML suspension 30 mL (has no administration in time range)    And  lidocaine  (XYLOCAINE ) 2 % viscous mouth solution 15 mL (has no administration in time range)  iohexol  (OMNIPAQUE ) 300 MG/ML solution 100 mL (100 mLs Intravenous Contrast Given 12/21/23 1819)  fentaNYL  (SUBLIMAZE ) injection 50 mcg (50 mcg Intravenous Given 12/21/23 2024)  iohexol  (OMNIPAQUE ) 350 MG/ML injection 100 mL (100 mLs Intravenous Contrast Given 12/21/23 2050)  famotidine  (PEPCID ) IVPB 20 mg premix (20 mg Intravenous New Bag/Given 12/21/23 2207)  HYDROmorphone  (DILAUDID ) injection 1 mg (1 mg Intravenous Given 12/21/23 2206)  ondansetron  (ZOFRAN ) injection 4 mg (4 mg Intravenous Given 12/21/23 2235)                                    Medical Decision Making Amount and/or Complexity of Data Reviewed Labs: ordered. Radiology: ordered.  Risk OTC drugs. Prescription drug management.    38 year old female with medical history significant for obesity, cannabis use, uterine fibroids presenting to the emergency department with severe epigastric abdominal pain and chest pain radiating to the back.  The patient had acute onset of pain earlier today, describes it as a stabbing pain that radiates all the way through..  She denies any nausea, vomiting, diarrhea.  No fevers or chills.  Pain is 10 out of 10 in severity on my initial evaluation.  On arrival, the patient was afebrile, not tachycardic or tachypneic, BP 134/100, saturating 100% on room air.  Physical exam revealed the patient in acute  extremis and acute pain.  Doubled over complaining of severe pain rating to the back.  Differential includes aortic dissection, considered GERD and/or esophageal spasm, considered ACS, less likely PE, considered pancreatitis, cholelithiasis/cholecystitis or other acute intra-abdominal emergency.  EKG: Sinus rhythm, ventricular rate 71, no abnormal intervals or acute ischemic changes.  From triage the patient underwent CT abdomen pelvis to evaluate for pancreatitis which resulted negative.  As the patient was in acute pain also now complaining of chest pain and back pain, will obtain dissection study to further evaluate.  Labs: Initial cardiac troponin normal, COVID flu and RSV PCR testing negative, urinalysis negative, lipase normal, CBC and CMP unremarkable.  Patient was administered IV Dilaudid  and fentanyl  for pain control in addition to IV Pepcid  and Zofran .  CTA dissection study: IMPRESSION:  1. No thoracoabdominal aortic aneurysm or dissection.  2. No central pulmonary embolism.  3. No acute findings in the chest, abdomen, or pelvis.    She was reassessed and was completely asymptomatic.  She states that symptoms have subsequently resolved.  Will plan on a delta troponin to rule out ACS however reassuring workup at this time.  Signout given to Dr. Bari to follow-up results of delta troponin, p.o.  challenge with a GI cocktail, likely plan for outpatient PCP follow-up if negative.     Final diagnoses:  None    ED Discharge Orders     None          Jerrol Agent, MD 12/21/23 2330

## 2023-12-21 NOTE — ED Notes (Signed)
 Call to add trop to blood previously sent

## 2023-12-21 NOTE — ED Provider Notes (Signed)
  EMERGENCY DEPARTMENT AT [facility] Provider Note   CSN: [id number] Arrival date & time: 12/21/23  1731     Patient presents with: Abdominal Pain   Darlene Hayes is a 38 y.o. female.    Abdominal Pain Associated symptoms: chest pain      39 year old female with medical history significant for obesity, cannabis use, uterine fibroids presenting to the emergency department with severe epigastric abdominal pain and chest pain radiating to the back.  The patient had acute onset of pain earlier today, describes it as a stabbing pain that radiates all the way through..  She denies any nausea, vomiting, diarrhea.  No fevers or chills.  Pain is 10 out of 10 in severity on my initial evaluation.  Prior to Admission medications  Medication Sig Start Date End Date Taking? Authorizing Provider  furosemide  (LASIX ) 20 MG tablet Take 1 tablet (20 mg total) by mouth daily as needed. 11/18/23 02/16/24  Tobb, Kardie, DO  ibuprofen  (ADVIL ) 600 MG tablet Take 1 tablet (600 mg total) by mouth every 6 (six) hours as needed. 04/02/23   Izell Harari, MD  Multiple Vitamins-Minerals (MULTIVITAMIN WOMEN PO) Take 1 capsule by mouth daily. Patient not taking: Reported on 11/18/2023    [provider]  OVER THE COUNTER MEDICATION Take 2 tablets by mouth daily. [employer] Iron Blood Builder Patient not taking: Reported on 11/18/2023    [provider]  potassium chloride  (KLOR-CON  M) 10 MEQ tablet Take 1 tablet (10 mEq total) by mouth daily as needed (lower extremity swelling). 11/18/23 02/16/24  Tobb, Kardie, DO  simethicone  (MYLICON) 80 MG chewable tablet Chew 1 tablet (80 mg total) by mouth 3 (three) times daily for 3 days. Patient not taking: Reported on 11/18/2023 04/02/23 08/25/23  Izell Harari, MD    Allergies: Patient has no known allergies.    Review of Systems  Cardiovascular:  Positive for chest pain.  Gastrointestinal:  Positive for abdominal pain.   All other systems reviewed and are negative.   Updated Vital Signs BP 131/84   Pulse 77   Temp 98.9 F (37.2 C) (Oral)   Resp 14   LMP 03/19/2023 Comment: continuously bleeding since Oct 2024  SpO2 100%   Physical Exam Vitals and nursing note reviewed.  Constitutional:      General: She is in acute distress.     Appearance: She is well-developed. She is ill-appearing.     Comments: In acute distress, doubled over in severe pain  HENT:     Head: Normocephalic and atraumatic.  Eyes:     Conjunctiva/sclera: Conjunctivae normal.  Cardiovascular:     Rate and Rhythm: Normal rate and regular rhythm.     Heart sounds: No murmur heard. Pulmonary:     Effort: Pulmonary effort is normal. No respiratory distress.     Breath sounds: Normal breath sounds.  Abdominal:     Palpations: Abdomen is soft.     Tenderness: There is no abdominal tenderness.  Musculoskeletal:        General: No swelling.     Cervical back: Neck supple.  Skin:    General: Skin is warm and dry.     Capillary Refill: Capillary refill takes less than 2 seconds.  Neurological:     Mental Status: She is alert.  Psychiatric:        Mood and Affect: Mood normal.     (all labs ordered are listed, but only abnormal results are displayed) Labs Reviewed  URINALYSIS, ROUTINE  W REFLEX MICROSCOPIC - Abnormal; Notable for the following components:      Result Value   Specific Gravity, Urine >1.046 (*)    Protein, ur 30 (*)    All other components within normal limits  RESP PANEL BY RT-PCR (RSV, FLU A&B, COVID)  RVPGX2  LIPASE, BLOOD  COMPREHENSIVE METABOLIC PANEL WITH GFR  CBC  TROPONIN T, HIGH SENSITIVITY  TROPONIN T, HIGH SENSITIVITY    EKG: None  Radiology: CT Angio Chest/Abd/Pel for Dissection W and/or Wo Contrast Result Date: 12/21/2023 EXAM: CTA CHEST, ABDOMEN AND PELVIS WITHOUT AND WITH CONTRAST 12/21/2023 08:52:19 PM TECHNIQUE: CTA of the chest was performed without and with the administration of  intravenous contrast. CTA of the abdomen and pelvis was performed without and with the administration of intravenous contrast. 100 mL of iohexol  (OMNIPAQUE ) 350 MG/ML injection was administered. Multiplanar reformatted images are provided for review. MIP images are provided for review. Automated exposure control, iterative reconstruction, and/or weight based adjustment of the mA/kV was utilized to reduce the radiation dose to as low as reasonably achievable. COMPARISON: CT abdomen and pelvis earlier today. CLINICAL HISTORY: Acute aortic syndrome (AAS) suspected. FINDINGS: VASCULATURE: AORTA: No thoracoabdominal aortic aneurysm or dissection. PULMONARY ARTERIES: No central pulmonary embolism to the lobar level. GREAT VESSELS OF AORTIC ARCH: No acute finding. No dissection. No arterial occlusion or significant stenosis. CELIAC TRUNK: No acute finding. No occlusion or significant stenosis. SUPERIOR MESENTERIC ARTERY: No acute finding. No occlusion or significant stenosis. INFERIOR MESENTERIC ARTERY: No acute finding. No occlusion or significant stenosis. RENAL ARTERIES: No acute finding. No occlusion or significant stenosis. ILIAC ARTERIES: No acute finding. No occlusion or significant stenosis. CHEST: MEDIASTINUM: No mediastinal lymphadenopathy. The heart and pericardium demonstrate no acute abnormality. LUNGS AND PLEURA: The lungs are without acute process. No focal consolidation or pulmonary edema. No evidence of pleural effusion or pneumothorax. THORACIC BONES AND SOFT TISSUES: No acute bone or soft tissue abnormality. ABDOMEN AND PELVIS: LIVER: The liver is unremarkable. GALLBLADDER AND BILE DUCTS: Gallbladder is unremarkable. No biliary ductal dilatation. SPLEEN: The spleen is unremarkable. PANCREAS: The pancreas is unremarkable. ADRENAL GLANDS: Bilateral adrenal glands demonstrate no acute abnormality. KIDNEYS, URETERS AND BLADDER: Excretory contrast in the bilateral renal collecting systems and a bladder. GI  AND BOWEL: Stomach and duodenal sweep demonstrate no acute abnormality. There is no bowel obstruction. No abnormal bowel wall thickening or distension. REPRODUCTIVE: Status post hysterectomy. PERITONEUM AND RETROPERITONEUM: No ascites or free air. LYMPH NODES: No lymphadenopathy. ABDOMINAL BONES AND SOFT TISSUES: No acute abnormality of the bones. No acute soft tissue abnormality. IMPRESSION: 1. No thoracoabdominal aortic aneurysm or dissection. 2. No central pulmonary embolism. 3. No acute findings in the chest, abdomen, or pelvis. Electronically signed by: Pinkie Pebbles MD 12/21/2023 09:03 PM EST RP Workstation: [id number]   CT ABDOMEN PELVIS W CONTRAST Result Date: 12/21/2023 CLINICAL DATA:  Abdominal pain. EXAM: CT ABDOMEN AND PELVIS WITH CONTRAST TECHNIQUE: Multidetector CT imaging of the abdomen and pelvis was performed using the standard protocol following bolus administration of intravenous contrast. RADIATION DOSE REDUCTION: This exam was performed according to the departmental dose-optimization program which includes automated exposure control, adjustment of the mA and/or kV according to patient size and/or use of iterative reconstruction technique. CONTRAST:  100mL OMNIPAQUE  IOHEXOL  300 MG/ML  SOLN COMPARISON:  None Available. FINDINGS: Lower chest: No acute abnormality. Hepatobiliary: No focal liver abnormality is seen. No gallstones, gallbladder wall thickening, or biliary dilatation. Pancreas: Unremarkable. No pancreatic ductal dilatation or surrounding inflammatory changes. Spleen: Normal  in size without focal abnormality. Adrenals/Urinary Tract: The adrenal glands are within normal limits. The kidneys enhance symmetrically. No renal calculus or hydronephrosis bilaterally. The bladder is within normal limits. Stomach/Bowel: The stomach is within normal limits. No bowel obstruction, free air, or pneumatosis is seen. Appendix is not definitely seen. Vascular/Lymphatic: No significant vascular  findings are present. No enlarged abdominal or pelvic lymph nodes. Reproductive: Status post hysterectomy. No adnexal masses. Other: No abdominopelvic ascites. Musculoskeletal: No acute osseous abnormality. IMPRESSION: No acute intra-abdominal process. Electronically Signed   By: Leita Birmingham M.D.   On: 12/21/2023 18:33     Procedures   Medications Ordered in the ED  alum & mag hydroxide-simeth (MAALOX/MYLANTA) 200-200-20 MG/5ML suspension 30 mL (has no administration in time range)    And  lidocaine  (XYLOCAINE ) 2 % viscous mouth solution 15 mL (has no administration in time range)  iohexol  (OMNIPAQUE ) 300 MG/ML solution 100 mL (100 mLs Intravenous Contrast Given 12/21/23 1819)  fentaNYL  (SUBLIMAZE ) injection 50 mcg (50 mcg Intravenous Given 12/21/23 2024)  iohexol  (OMNIPAQUE ) 350 MG/ML injection 100 mL (100 mLs Intravenous Contrast Given 12/21/23 2050)  famotidine  (PEPCID ) IVPB 20 mg premix (20 mg Intravenous New Bag/Given 12/21/23 2207)  HYDROmorphone  (DILAUDID ) injection 1 mg (1 mg Intravenous Given 12/21/23 2206)  ondansetron  (ZOFRAN ) injection 4 mg (4 mg Intravenous Given 12/21/23 2235)                                    Medical Decision Making Amount and/or Complexity of Data Reviewed Labs: ordered. Radiology: ordered.  Risk Prescription drug management.    38 year old female with medical history significant for obesity, cannabis use, uterine fibroids presenting to the emergency department with severe epigastric abdominal pain and chest pain radiating to the back.  The patient had acute onset of pain earlier today, describes it as a stabbing pain that radiates all the way through..  She denies any nausea, vomiting, diarrhea.  No fevers or chills.  Pain is 10 out of 10 in severity on my initial evaluation.  On arrival, the patient was afebrile, not tachycardic or tachypneic, BP 134/100, saturating 100% on room air.  Physical exam revealed the patient in acute extremis and  acute pain.  Doubled over complaining of severe pain rating to the back.  Differential includes aortic dissection, considered GERD and/or esophageal spasm, considered ACS, less likely PE, considered pancreatitis, cholelithiasis/cholecystitis or other acute intra-abdominal emergency.  EKG: Sinus rhythm, ventricular rate 71, no abnormal intervals or acute ischemic changes.  From triage the patient underwent CT abdomen pelvis to evaluate for pancreatitis which resulted negative.  As the patient was in acute pain also now complaining of chest pain and back pain, will obtain dissection study to further evaluate.  Labs: Initial cardiac troponin normal, COVID flu and RSV PCR testing negative, urinalysis negative, lipase normal, CBC and CMP unremarkable.  Patient was administered IV Dilaudid  and fentanyl  for pain control in addition to IV Pepcid  and Zofran .  CTA dissection study: IMPRESSION:  1. No thoracoabdominal aortic aneurysm or dissection.  2. No central pulmonary embolism.  3. No acute findings in the chest, abdomen, or pelvis.    She was reassessed and was completely asymptomatic.  She states that symptoms have subsequently resolved.  Will plan on a delta troponin to rule out ACS however reassuring workup at this time.  Signout given to Dr. Bari to follow-up results of delta troponin, p.o. challenge with  a GI cocktail, likely plan for outpatient PCP follow-up if negative.     Final diagnoses:  None    ED Discharge Orders     None          Jerrol Agent, MD 12/21/23 2330

## 2023-12-22 MED ORDER — DIPHENHYDRAMINE HCL 50 MG/ML IJ SOLN
12.5000 mg | Freq: Once | INTRAMUSCULAR | Status: AC
  Administered 2023-12-22: 12.5 mg via INTRAVENOUS
  Filled 2023-12-22: qty 1

## 2023-12-22 MED ORDER — METOCLOPRAMIDE HCL 5 MG/ML IJ SOLN
10.0000 mg | Freq: Once | INTRAMUSCULAR | Status: AC
  Administered 2023-12-22: 10 mg via INTRAVENOUS
  Filled 2023-12-22: qty 2

## 2023-12-24 ENCOUNTER — Emergency Department (HOSPITAL_BASED_OUTPATIENT_CLINIC_OR_DEPARTMENT_OTHER)

## 2023-12-24 ENCOUNTER — Other Ambulatory Visit: Payer: Self-pay

## 2023-12-24 ENCOUNTER — Encounter (HOSPITAL_COMMUNITY): Payer: Self-pay

## 2023-12-24 ENCOUNTER — Ambulatory Visit (HOSPITAL_BASED_OUTPATIENT_CLINIC_OR_DEPARTMENT_OTHER)
Admission: EM | Admit: 2023-12-24 | Discharge: 2023-12-24 | Disposition: A | Discharge status: Home / Self Care | Attending: Emergency Medicine | Admitting: Emergency Medicine

## 2023-12-24 ENCOUNTER — Emergency Department (HOSPITAL_BASED_OUTPATIENT_CLINIC_OR_DEPARTMENT_OTHER): Admitting: Anesthesiology

## 2023-12-24 ENCOUNTER — Encounter (HOSPITAL_COMMUNITY)
Admission: EM | Disposition: A | Discharge status: Home / Self Care | Payer: Self-pay | Source: Home / Self Care | Attending: Emergency Medicine

## 2023-12-24 ENCOUNTER — Emergency Department (HOSPITAL_COMMUNITY): Admitting: Anesthesiology

## 2023-12-24 DIAGNOSIS — Z87891 Personal history of nicotine dependence: Secondary | ICD-10-CM | POA: Diagnosis not present

## 2023-12-24 DIAGNOSIS — K8000 Calculus of gallbladder with acute cholecystitis without obstruction: Secondary | ICD-10-CM | POA: Diagnosis not present

## 2023-12-24 DIAGNOSIS — K8012 Calculus of gallbladder with acute and chronic cholecystitis without obstruction: Secondary | ICD-10-CM

## 2023-12-24 DIAGNOSIS — R1084 Generalized abdominal pain: Secondary | ICD-10-CM | POA: Diagnosis not present

## 2023-12-24 DIAGNOSIS — R110 Nausea: Secondary | ICD-10-CM | POA: Diagnosis not present

## 2023-12-24 DIAGNOSIS — K819 Cholecystitis, unspecified: Secondary | ICD-10-CM

## 2023-12-24 DIAGNOSIS — Z743 Need for continuous supervision: Secondary | ICD-10-CM | POA: Diagnosis not present

## 2023-12-24 HISTORY — PX: CHOLECYSTECTOMY: SHX55

## 2023-12-24 LAB — CBC
HCT: 37.6 % (ref 36.0–46.0)
Hemoglobin: 12.9 g/dL (ref 12.0–15.0)
MCH: 31.5 pg (ref 26.0–34.0)
MCHC: 34.3 g/dL (ref 30.0–36.0)
MCV: 91.9 fL (ref 80.0–100.0)
Platelets: 220 K/uL (ref 150–400)
RBC: 4.09 MIL/uL (ref 3.87–5.11)
RDW: 13.8 % (ref 11.5–15.5)
WBC: 6.5 K/uL (ref 4.0–10.5)
nRBC: 0 % (ref 0.0–0.2)

## 2023-12-24 LAB — BASIC METABOLIC PANEL WITH GFR
Anion gap: 14 (ref 5–15)
BUN: 10 mg/dL (ref 6–20)
CO2: 23 mmol/L (ref 22–32)
Calcium: 9.8 mg/dL (ref 8.9–10.3)
Chloride: 101 mmol/L (ref 98–111)
Creatinine, Ser: 0.78 mg/dL (ref 0.44–1.00)
GFR, Estimated: >60 mL/min (ref >60)
Glucose, Bld: 110 mg/dL — ABNORMAL HIGH (ref 70–99)
Potassium: 3.4 mmol/L — ABNORMAL LOW (ref 3.5–5.1)
Sodium: 138 mmol/L (ref 135–145)

## 2023-12-24 LAB — HEPATIC FUNCTION PANEL
ALT: 12 U/L (ref 0–44)
AST: 20 U/L (ref 15–41)
Albumin: 4.4 g/dL (ref 3.5–5.0)
Alkaline Phosphatase: 121 U/L (ref 38–126)
Bilirubin, Direct: 0.2 mg/dL (ref 0.0–0.2)
Indirect Bilirubin: 0.3 mg/dL (ref 0.3–0.9)
Total Bilirubin: 0.5 mg/dL (ref 0.0–1.2)
Total Protein: 8.3 g/dL — ABNORMAL HIGH (ref 6.5–8.1)

## 2023-12-24 LAB — TROPONIN T, HIGH SENSITIVITY: Troponin T High Sensitivity: <15 ng/L (ref 0–19)

## 2023-12-24 LAB — LIPASE, BLOOD: Lipase: 14 U/L (ref 11–51)

## 2023-12-24 SURGERY — LAPAROSCOPIC CHOLECYSTECTOMY
Anesthesia: General | Site: Abdomen

## 2023-12-24 MED ORDER — CEFAZOLIN SODIUM-DEXTROSE 2-4 GM/100ML-% IV SOLN
INTRAVENOUS | Status: AC
  Filled 2023-12-24: qty 100

## 2023-12-24 MED ORDER — LIDOCAINE 2% (20 MG/ML) 5 ML SYRINGE
INTRAMUSCULAR | Status: DC | PRN
  Administered 2023-12-24: 15:00:00 50 mg via INTRAVENOUS

## 2023-12-24 MED ORDER — AMISULPRIDE (ANTIEMETIC) 5 MG/2ML IV SOLN
10.0000 mg | Freq: Once | INTRAVENOUS | Status: DC | PRN
  Administered 2023-12-24: 17:00:00 10 mg via INTRAVENOUS

## 2023-12-24 MED ORDER — CHLORHEXIDINE GLUCONATE 0.12 % MT SOLN: 15.0000 mL | Freq: Once | OROMUCOSAL | Status: AC

## 2023-12-24 MED ORDER — ONDANSETRON HCL 4 MG/2ML IJ SOLN
INTRAMUSCULAR | Status: DC | PRN
  Administered 2023-12-24: 15:00:00 4 mg via INTRAVENOUS

## 2023-12-24 MED ORDER — SUCRALFATE 1 G PO TABS
1.0000 g | ORAL_TABLET | Freq: Once | ORAL | Status: AC
  Administered 2023-12-24: 05:00:00 1 g via ORAL
  Filled 2023-12-24: qty 1

## 2023-12-24 MED ORDER — DEXAMETHASONE SOD PHOSPHATE PF 10 MG/ML IJ SOLN
INTRAMUSCULAR | Status: DC | PRN
  Administered 2023-12-24: 15:00:00 10 mg via INTRAVENOUS

## 2023-12-24 MED ORDER — ACETAMINOPHEN 10 MG/ML IV SOLN
INTRAVENOUS | Status: DC | PRN
  Administered 2023-12-24: 15:00:00 1000 mg via INTRAVENOUS

## 2023-12-24 MED ORDER — OXYCODONE HCL 5 MG PO TABS: 5.0000 mg | ORAL_TABLET | Freq: Four times a day (QID) | ORAL | 0 refills | Status: AC | PRN

## 2023-12-24 MED ORDER — MIDAZOLAM HCL (PF) 2 MG/2ML IJ SOLN
INTRAMUSCULAR | Status: DC | PRN
  Administered 2023-12-24: 14:00:00 2 mg via INTRAVENOUS

## 2023-12-24 MED ORDER — METOCLOPRAMIDE HCL 5 MG/ML IJ SOLN
10.0000 mg | Freq: Once | INTRAMUSCULAR | Status: AC
  Administered 2023-12-24: 11:00:00 10 mg via INTRAVENOUS
  Filled 2023-12-24: qty 2

## 2023-12-24 MED ORDER — ACETAMINOPHEN 325 MG PO TABS: 650.0000 mg | ORAL_TABLET | ORAL | Status: DC | PRN

## 2023-12-24 MED ORDER — KETOROLAC TROMETHAMINE 15 MG/ML IJ SOLN
15.0000 mg | Freq: Once | INTRAMUSCULAR | Status: AC
  Administered 2023-12-24: 09:00:00 15 mg via INTRAVENOUS
  Filled 2023-12-24: qty 1

## 2023-12-24 MED ORDER — FENTANYL CITRATE (PF) 100 MCG/2ML IJ SOLN
INTRAMUSCULAR | Status: AC
  Filled 2023-12-24: qty 2

## 2023-12-24 MED ORDER — ORAL CARE MOUTH RINSE: 15.0000 mL | Freq: Once | OROMUCOSAL | Status: AC

## 2023-12-24 MED ORDER — SODIUM CHLORIDE 0.9 % IV SOLN: 250.0000 mL | INTRAVENOUS | Status: DC | PRN

## 2023-12-24 MED ORDER — 0.9 % SODIUM CHLORIDE (POUR BTL) OPTIME
TOPICAL | Status: DC | PRN
  Administered 2023-12-24: 16:00:00 1000 mL

## 2023-12-24 MED ORDER — HYDROMORPHONE HCL 1 MG/ML IJ SOLN
1.0000 mg | Freq: Once | INTRAMUSCULAR | Status: AC
  Administered 2023-12-24: 08:00:00 1 mg via INTRAVENOUS
  Filled 2023-12-24: qty 1

## 2023-12-24 MED ORDER — DROPERIDOL 2.5 MG/ML IJ SOLN
1.2500 mg | Freq: Once | INTRAMUSCULAR | Status: AC
  Administered 2023-12-24: 05:00:00 1.25 mg via INTRAVENOUS
  Filled 2023-12-24: qty 2

## 2023-12-24 MED ORDER — ACETAMINOPHEN 10 MG/ML IV SOLN: 1000.0000 mg | Freq: Once | INTRAVENOUS | Status: DC | PRN

## 2023-12-24 MED ORDER — MIDAZOLAM HCL 2 MG/2ML IJ SOLN
INTRAMUSCULAR | Status: AC
  Filled 2023-12-24: qty 2

## 2023-12-24 MED ORDER — ACETAMINOPHEN 650 MG RE SUPP: 650.0000 mg | RECTAL | Status: DC | PRN

## 2023-12-24 MED ORDER — MORPHINE SULFATE (PF) 4 MG/ML IV SOLN
4.0000 mg | Freq: Once | INTRAVENOUS | Status: AC
  Administered 2023-12-24: 05:00:00 4 mg via INTRAVENOUS
  Filled 2023-12-24: qty 1

## 2023-12-24 MED ORDER — DEXMEDETOMIDINE HCL IN NACL 80 MCG/20ML IV SOLN
INTRAVENOUS | Status: DC | PRN
  Administered 2023-12-24 (×2): 10 ug via INTRAVENOUS

## 2023-12-24 MED ORDER — BUPIVACAINE-EPINEPHRINE (PF) 0.25% -1:200000 IJ SOLN
INTRAMUSCULAR | Status: AC
  Filled 2023-12-24: qty 30

## 2023-12-24 MED ORDER — OXYCODONE HCL 5 MG PO TABS: 5.0000 mg | ORAL_TABLET | Freq: Once | ORAL | Status: DC | PRN

## 2023-12-24 MED ORDER — PROPOFOL 10 MG/ML IV BOLUS
INTRAVENOUS | Status: AC
  Filled 2023-12-24: qty 20

## 2023-12-24 MED ORDER — ALUM & MAG HYDROXIDE-SIMETH 200-200-20 MG/5ML PO SUSP
30.0000 mL | Freq: Once | ORAL | Status: AC
  Administered 2023-12-24: 05:00:00 30 mL via ORAL
  Filled 2023-12-24: qty 30

## 2023-12-24 MED ORDER — SODIUM CHLORIDE 0.9% FLUSH: 3.0000 mL | Freq: Two times a day (BID) | INTRAVENOUS | Status: DC

## 2023-12-24 MED ORDER — CEFAZOLIN SODIUM-DEXTROSE 2-3 GM-%(50ML) IV SOLR
INTRAVENOUS | Status: DC | PRN
  Administered 2023-12-24: 15:00:00 2 g via INTRAVENOUS

## 2023-12-24 MED ORDER — SUGAMMADEX SODIUM 200 MG/2ML IV SOLN
INTRAVENOUS | Status: DC | PRN
  Administered 2023-12-24: 16:00:00 300 mg via INTRAVENOUS

## 2023-12-24 MED ORDER — ONDANSETRON HCL 4 MG/2ML IJ SOLN
4.0000 mg | Freq: Once | INTRAMUSCULAR | Status: AC
  Administered 2023-12-24: 05:00:00 4 mg via INTRAVENOUS
  Filled 2023-12-24: qty 2

## 2023-12-24 MED ORDER — PANTOPRAZOLE SODIUM 40 MG IV SOLR
40.0000 mg | Freq: Once | INTRAVENOUS | Status: AC
  Administered 2023-12-24: 05:00:00 40 mg via INTRAVENOUS
  Filled 2023-12-24: qty 10

## 2023-12-24 MED ORDER — LACTATED RINGERS IV SOLN: INTRAVENOUS | Status: DC

## 2023-12-24 MED ORDER — SODIUM CHLORIDE 0.9 % IR SOLN
Status: DC | PRN
  Administered 2023-12-24: 15:00:00 1000 mL

## 2023-12-24 MED ORDER — BUPIVACAINE-EPINEPHRINE 0.25% -1:200000 IJ SOLN
INTRAMUSCULAR | Status: DC | PRN
  Administered 2023-12-24: 15:00:00 8 mL

## 2023-12-24 MED ORDER — PROPOFOL 10 MG/ML IV BOLUS
INTRAVENOUS | Status: DC | PRN
  Administered 2023-12-24: 15:00:00 50 mg via INTRAVENOUS
  Administered 2023-12-24: 15:00:00 150 mg via INTRAVENOUS

## 2023-12-24 MED ORDER — MORPHINE SULFATE (PF) 4 MG/ML IV SOLN
4.0000 mg | Freq: Once | INTRAVENOUS | Status: AC
  Administered 2023-12-24: 07:00:00 4 mg via INTRAVENOUS
  Filled 2023-12-24: qty 1

## 2023-12-24 MED ORDER — ROCURONIUM BROMIDE 10 MG/ML (PF) SYRINGE
PREFILLED_SYRINGE | INTRAVENOUS | Status: DC | PRN
  Administered 2023-12-24: 15:00:00 50 mg via INTRAVENOUS

## 2023-12-24 MED ORDER — HEMOSTATIC AGENTS (NO CHARGE) OPTIME
TOPICAL | Status: DC | PRN
  Administered 2023-12-24: 16:00:00 1 via TOPICAL

## 2023-12-24 MED ORDER — SODIUM CHLORIDE 0.9 % IV SOLN
2.0000 g | Freq: Once | INTRAVENOUS | Status: AC
  Administered 2023-12-24: 07:00:00 2 g via INTRAVENOUS
  Filled 2023-12-24: qty 20

## 2023-12-24 MED ORDER — SPY AGENT GREEN - (INDOCYANINE FOR INJECTION)
1.2500 mg | Freq: Once | INTRAMUSCULAR | Status: AC
  Administered 2023-12-24: 14:00:00 1.25 mg via INTRAVENOUS

## 2023-12-24 MED ORDER — SODIUM CHLORIDE 0.9% FLUSH: 3.0000 mL | INTRAVENOUS | Status: DC | PRN

## 2023-12-24 MED ORDER — OXYCODONE HCL 5 MG/5ML PO SOLN: 5.0000 mg | Freq: Once | ORAL | Status: DC | PRN

## 2023-12-24 MED ORDER — CHLORHEXIDINE GLUCONATE 0.12 % MT SOLN
OROMUCOSAL | Status: AC
  Administered 2023-12-24: 14:00:00 15 mL via OROMUCOSAL
  Filled 2023-12-24: qty 15

## 2023-12-24 MED ORDER — FENTANYL CITRATE (PF) 100 MCG/2ML IJ SOLN: 25.0000 ug | INTRAMUSCULAR | Status: DC | PRN

## 2023-12-24 MED ORDER — AMISULPRIDE (ANTIEMETIC) 5 MG/2ML IV SOLN
INTRAVENOUS | Status: AC
  Filled 2023-12-24: qty 4

## 2023-12-24 MED ORDER — OXYCODONE HCL 5 MG PO TABS
5.0000 mg | ORAL_TABLET | ORAL | Status: DC | PRN
  Filled 2023-12-24: qty 2

## 2023-12-24 MED ADMIN — Fentanyl Citrate Preservative Free (PF) Inj 250 MCG/5ML: 100 ug | INTRAVENOUS | @ 15:00:00 | NDC 72572017125

## 2023-12-24 MED ADMIN — Fentanyl Citrate Preservative Free (PF) Inj 250 MCG/5ML: 50 ug | INTRAVENOUS | @ 15:00:00 | NDC 72572017125

## 2023-12-24 SURGICAL SUPPLY — 33 items
BAG COUNTER SPONGE SURGICOUNT (BAG) ×1 IMPLANT
BLADE CLIPPER SURG (BLADE) IMPLANT
CANISTER SUCTION 3000ML PPV (SUCTIONS) ×1 IMPLANT
CHLORAPREP W/TINT 26 (MISCELLANEOUS) ×1 IMPLANT
CLIP APPLIE 5 13 M/L LIGAMAX5 (MISCELLANEOUS) ×1 IMPLANT
COVER SURGICAL LIGHT HANDLE (MISCELLANEOUS) ×1 IMPLANT
DERMABOND ADVANCED .7 DNX12 (GAUZE/BANDAGES/DRESSINGS) ×1 IMPLANT
ELECTRODE REM PT RTRN 9FT ADLT (ELECTROSURGICAL) ×1 IMPLANT
GLOVE BIO SURGEON STRL SZ7 (GLOVE) ×1 IMPLANT
GLOVE BIOGEL PI IND STRL 7.5 (GLOVE) ×1 IMPLANT
GOWN STRL REUS W/ TWL LRG LVL3 (GOWN DISPOSABLE) ×3 IMPLANT
GRASPER SUT TROCAR 14GX15 (MISCELLANEOUS) ×1 IMPLANT
HEMOSTAT SNOW SURGICEL 2X4 (HEMOSTASIS) IMPLANT
IRRIGATION SUCT STRKRFLW 2 WTP (MISCELLANEOUS) ×1 IMPLANT
KIT BASIN OR (CUSTOM PROCEDURE TRAY) ×1 IMPLANT
KIT IMAGING PINPOINTPAQ (MISCELLANEOUS) IMPLANT
KIT TURNOVER KIT B (KITS) ×1 IMPLANT
PAD ARMBOARD POSITIONER FOAM (MISCELLANEOUS) ×1 IMPLANT
POUCH RETRIEVAL ECOSAC 10 (ENDOMECHANICALS) ×1 IMPLANT
SCISSORS LAP 5X35 DISP (ENDOMECHANICALS) ×1 IMPLANT
SET TUBE SMOKE EVAC HIGH FLOW (TUBING) ×1 IMPLANT
SLEEVE Z-THREAD 5X100MM (TROCAR) ×2 IMPLANT
SOLN 0.9% NACL POUR BTL 1000ML (IV SOLUTION) ×1 IMPLANT
SOLN STERILE WATER BTL 1000 ML (IV SOLUTION) ×1 IMPLANT
STRIP CLOSURE SKIN 1/2X4 (GAUZE/BANDAGES/DRESSINGS) ×1 IMPLANT
SUT MNCRL AB 4-0 PS2 18 (SUTURE) ×1 IMPLANT
SUT VICRYL 0 UR6 27IN ABS (SUTURE) ×1 IMPLANT
TOWEL GREEN STERILE (TOWEL DISPOSABLE) ×1 IMPLANT
TOWEL GREEN STERILE FF (TOWEL DISPOSABLE) ×1 IMPLANT
TRAY LAPAROSCOPIC MC (CUSTOM PROCEDURE TRAY) ×1 IMPLANT
TROCAR BALLN 12MMX100 BLUNT (TROCAR) ×1 IMPLANT
TROCAR Z-THREAD OPTICAL 5X100M (TROCAR) ×1 IMPLANT
WARMER LAPAROSCOPE (MISCELLANEOUS) ×1 IMPLANT

## 2023-12-24 NOTE — Anesthesia Postprocedure Evaluation (Signed)
 Anesthesia Post Note  Patient: Darlene Hayes  Procedure(s) Performed: LAPAROSCOPIC CHOLECYSTECTOMY (Abdomen)     Patient location during evaluation: PACU Anesthesia Type: General Level of consciousness: awake Pain management: pain level controlled Vital Signs Assessment: post-procedure vital signs reviewed and stable Respiratory status: spontaneous breathing, nonlabored ventilation and respiratory function stable Cardiovascular status: blood pressure returned to baseline and stable Postop Assessment: no apparent nausea or vomiting Anesthetic complications: no   No notable events documented.  Last Vitals:  Vitals:   12/24/23 1630 12/24/23 1645  BP: 122/84 119/64  Pulse: 73 74  Resp: 17 18  Temp:    SpO2: 95% 92%    Last Pain:  Vitals:   12/24/23 1630  TempSrc:   PainSc: Asleep   Pain Goal:                   Delon Aisha Arch

## 2023-12-24 NOTE — ED Notes (Signed)
 Report given to the Floor RN.

## 2023-12-24 NOTE — Discharge Instructions (Signed)

## 2023-12-24 NOTE — ED Notes (Signed)
 Called [facility] to transport the patient to [facility] by 1pm.  Dr. Ebbie is accepting

## 2023-12-24 NOTE — ED Triage Notes (Signed)
 [facility] into back. Here 12/14 for same. Much worse than before.

## 2023-12-24 NOTE — ED Provider Notes (Addendum)
 [place] EMERGENCY DEPARTMENT AT [facility] Provider Note   CSN: [id number] Arrival date & time: 12/24/23  0210     Patient presents with: Chest Pain   Darlene Hayes is a 38 y.o. female.   HPI     This is a 38 year old female who presents with epigastric and chest pain.  Was seen and evaluated on 12/14 for same.  Reports persistent and worsening symptoms.  Was treated for reflux.  Worse with eating.  States that she has not had any improvement.  Reports significant upper abdominal discomfort and nausea.  No vomiting.  No change in bowel movements.  Denies fevers or respiratory symptoms.  Prior to Admission medications  Medication Sig Start Date End Date Taking? Authorizing Provider  furosemide  (LASIX ) 20 MG tablet Take 1 tablet (20 mg total) by mouth daily as needed. 11/18/23 02/16/24  Tobb, Kardie, DO  ibuprofen  (ADVIL ) 600 MG tablet Take 1 tablet (600 mg total) by mouth every 6 (six) hours as needed. 04/02/23   Izell Harari, MD  Multiple Vitamins-Minerals (MULTIVITAMIN WOMEN PO) Take 1 capsule by mouth daily. Patient not taking: Reported on 11/18/2023    [provider]  OVER THE COUNTER MEDICATION Take 2 tablets by mouth daily. [employer] Iron Blood Builder Patient not taking: Reported on 11/18/2023    [provider]  potassium chloride  (KLOR-CON  M) 10 MEQ tablet Take 1 tablet (10 mEq total) by mouth daily as needed (lower extremity swelling). 11/18/23 02/16/24  Tobb, Kardie, DO  simethicone  (MYLICON) 80 MG chewable tablet Chew 1 tablet (80 mg total) by mouth 3 (three) times daily for 3 days. Patient not taking: Reported on 11/18/2023 04/02/23 08/25/23  Izell Harari, MD    Allergies: Patient has no known allergies.    Review of Systems  Constitutional:  Negative for fever.  Respiratory:  Negative for shortness of breath.   Cardiovascular:  Negative for chest pain.  Gastrointestinal:  Positive for abdominal pain and nausea. Negative for  constipation, diarrhea and vomiting.  All other systems reviewed and are negative.   Updated Vital Signs BP 120/64   Pulse 72   Temp 97.9 F (36.6 C) (Oral)   Resp 18   LMP 03/19/2023 Comment: continuously bleeding since Oct 2024  SpO2 100%   Physical Exam Vitals and nursing note reviewed.  Constitutional:      Appearance: She is well-developed. She is obese. She is not ill-appearing.  HENT:     Head: Normocephalic and atraumatic.  Eyes:     Pupils: Pupils are equal, round, and reactive to light.  Cardiovascular:     Rate and Rhythm: Normal rate and regular rhythm.     Heart sounds: Normal heart sounds.  Pulmonary:     Effort: Pulmonary effort is normal. No respiratory distress.     Breath sounds: No wheezing.  Abdominal:     General: Bowel sounds are normal.     Palpations: Abdomen is soft.     Tenderness: There is abdominal tenderness.     Comments: Epigastric tenderness to palpation, no rebound or guarding  Musculoskeletal:     Cervical back: Neck supple.  Skin:    General: Skin is warm and dry.  Neurological:     Mental Status: She is alert and oriented to person, place, and time.     (all labs ordered are listed, but only abnormal results are displayed) Labs Reviewed  BASIC METABOLIC PANEL WITH GFR - Abnormal; Notable for the following components:  Result Value   Potassium 3.4 (*)    Glucose, Bld 110 (*)    All other components within normal limits  HEPATIC FUNCTION PANEL - Abnormal; Notable for the following components:   Total Protein 8.3 (*)    All other components within normal limits  CBC  LIPASE, BLOOD  TROPONIN T, HIGH SENSITIVITY  TROPONIN T, HIGH SENSITIVITY    EKG: EKG Interpretation Date/Time:  Wednesday December 24 2023 03:01:02 EST Ventricular Rate:  97 PR Interval:  126 QRS Duration:  72 QT Interval:  382 QTC Calculation: 485 R Axis:   71  Text Interpretation: Normal sinus rhythm Prolonged QT Abnormal ECG When compared with ECG  of 21-Dec-2023 20:35, PREVIOUS ECG IS PRESENT Confirmed by Bari Pfeiffer ([id number]) on 12/24/2023 6:32:45 AM  Radiology: US  Abdomen Limited RUQ (LIVER/GB) Result Date: 12/24/2023 EXAM: Right Upper Quadrant Abdominal Ultrasound 12/24/2023 05:50:51 AM TECHNIQUE: Real-time ultrasonography of the right upper quadrant of the abdomen was performed. COMPARISON: CT abdomen and pelvis 12/21/2023. CLINICAL HISTORY: RUQ pain. FINDINGS: LIVER: The liver demonstrates normal echogenicity. No intrahepatic biliary ductal dilatation. No evidence of mass. The portal vein is patent with normal flow towards the liver. BILIARY SYSTEM: The gallbladder wall thickness measures 3.5 mm. Numerous gallstones identified within the gallbladder measures 1 cm. Negative sonographic Murphy sign. No pericholecystic fluid or gallbladder sludge. The common bile duct measures 6.7 mm. OTHER: No right upper quadrant ascites. IMPRESSION: 1. Cholelithiasis with mild gallbladder wall thickening measuring 3.5 mm in thickness. No pericholecystic fluid or sonographic murphy sign. Findings are equivocal for acute cholecystitis. 2. Upper limits of normal caliber of the common bile duct measure 6.7 mm. Electronically signed by: Waddell Calk MD 12/24/2023 06:05 AM EST RP Workstation: [id number]   DG Chest Portable 1 View Result Date: 12/24/2023 EXAM: 1 VIEW XRAY OF THE CHEST 12/24/2023 03:58:01 AM COMPARISON: 08/22/2023 CLINICAL HISTORY: Chest pain FINDINGS: LUNGS AND PLEURA: No focal pulmonary opacity. No pleural effusion. No pneumothorax. HEART AND MEDIASTINUM: No acute abnormality of the cardiac and mediastinal silhouettes. BONES AND SOFT TISSUES: No acute osseous abnormality. IMPRESSION: 1. No acute cardiopulmonary pathology. Electronically signed by: Oneil Devonshire MD 12/24/2023 03:59 AM EST RP Workstation: [id number]     Procedures   Medications Ordered in the ED  cefTRIAXone  (ROCEPHIN ) 2 g in sodium chloride  0.9 % 100 mL IVPB (2 g Intravenous  New Bag/Given 12/24/23 0642)  sucralfate  (CARAFATE ) tablet 1 g (1 g Oral Given 12/24/23 0500)  alum & mag hydroxide-simeth (MAALOX/MYLANTA) 200-200-20 MG/5ML suspension 30 mL (30 mLs Oral Given 12/24/23 0438)  ondansetron  (ZOFRAN ) injection 4 mg (4 mg Intravenous Given 12/24/23 0433)  morphine  (PF) 4 MG/ML injection 4 mg (4 mg Intravenous Given 12/24/23 0433)  pantoprazole  (PROTONIX ) injection 40 mg (40 mg Intravenous Given 12/24/23 0436)  droperidol  (INAPSINE ) 2.5 MG/ML injection 1.25 mg (1.25 mg Intravenous Given 12/24/23 0515)  morphine  (PF) 4 MG/ML injection 4 mg (4 mg Intravenous Given 12/24/23 0635)    Clinical Course as of 12/24/23 0656  Wed Dec 24, 2023  0628 Patient having ongoing pain.  Her pain is consistent with gallbladder pathology.  This is her second ED visit.  Ultrasound findings are equivocal for cholecystitis.  Given this, will discuss with general surgery. [CH]  0655 Spoke to General Surgery, Stechschulte.  Will make plan for possible cholecystectomy later today. [CH]    Clinical Course User Index [CH] Willamae Demby, Pfeiffer FALCON, MD  Medical Decision Making Amount and/or Complexity of Data Reviewed Labs: ordered. Radiology: ordered.  Risk OTC drugs. Prescription drug management.   This patient presents to the ED for concern of abdominal pain, this involves an extensive number of treatment options, and is a complaint that carries with it a high risk of complications and morbidity.  I considered the following differential and admission for this acute, potentially life threatening condition.  The differential diagnosis includes gastritis, cholecystitis, pancreatitis, gastroenteritis  MDM:    This is a 38 year old female who presents with recurrent and persistent upper abdominal pain.  She is nontoxic.  Recent evaluation included CT imaging of the chest abdomen pelvis.  She was treated for reflux.  She is tender in the right upper and  epigastric region.  Labs obtained.  No significant leukocytosis or LFT derangement.  Ultrasound imaging was obtained to evaluate for gallbladder pathology.  Patient given pain and nausea medication.  Ultrasound is equivocal for cholecystitis with cholelithiasis and mild gallbladder wall thickening.  Also upper limits of normal for common bile duct at 6.7.  On recheck she continues to be quite uncomfortable.  Discussed with her findings.  Will discuss with general surgery.  Patient given IV Rocephin  and made NPO.  (Labs, imaging, consults)  Labs: I Ordered, and personally interpreted labs.  The pertinent results include: CBC, CMP, lipase  Imaging Studies ordered: I ordered imaging studies including ultrasound I independently visualized and interpreted imaging. I agree with the radiologist interpretation  Additional history obtained from chart review.  External records from outside source obtained and reviewed including recent evaluations  Cardiac Monitoring: The patient was maintained on a cardiac monitor.  If on the cardiac monitor, I personally viewed and interpreted the cardiac monitored which showed an underlying rhythm of: Sinus  Reevaluation: After the interventions noted above, I reevaluated the patient and found that they have :stayed the same  Social Determinants of Health:  lives independently  Disposition: Admit  Co morbidities that complicate the patient evaluation  Past Medical History:  Diagnosis Date   Abnormal uterine bleeding (AUB)    History of abnormal cervical Pap smear    since 2000   History of chlamydia infection 07/2010   treated. hasn't been with that partner since.    History of gestational diabetes    History of gonorrhea    treated in July 2012. hasn't been with that partner since.    History of pregnancy induced hypertension    Iron deficiency anemia due to chronic blood loss    referred by hematology--- dr pauletta chihuahua---   IV iron infusion,   [id number];  12-14-2022;   01-21-2023;  01-28-2023   Mitral regurgitation    Symptomatic anemia    (01-24-2023  pt stated symptoms are chest pain, intermittant heart flutters, sob, weakness, fatigue)  admission in epic 11-06-2022  --- SOB / CP/  vaginal bleeding /  Hg2.6;   transfused 3units PRBC and 2 iron infusions ;  also referred to cardiology due to echo w/ moderate to severe MR unspecified pulm HTN   Uterine fibroid      Medicines Meds ordered this encounter  Medications   sucralfate  (CARAFATE ) tablet 1 g   alum & mag hydroxide-simeth (MAALOX/MYLANTA) 200-200-20 MG/5ML suspension 30 mL   ondansetron  (ZOFRAN ) injection 4 mg   morphine  (PF) 4 MG/ML injection 4 mg   pantoprazole  (PROTONIX ) injection 40 mg   droperidol  (INAPSINE ) 2.5 MG/ML injection 1.25 mg   morphine  (PF) 4 MG/ML injection 4 mg  cefTRIAXone  (ROCEPHIN ) 2 g in sodium chloride  0.9 % 100 mL IVPB    Antibiotic Indication::   Intra-abdominal    I have reviewed the patients home medicines and have made adjustments as needed  Problem List / ED Course: Problem List Items Addressed This Visit   None            Final diagnoses:  None    ED Discharge Orders     None          Nicolaas Savo, Charmaine FALCON, MD 12/24/23 [id number]    Bari Charmaine FALCON, MD 12/24/23 [phone]

## 2023-12-24 NOTE — Anesthesia Preprocedure Evaluation (Addendum)
 Anesthesia Evaluation  Patient identified by MRN, date of birth, ID band Patient awake    Reviewed: Allergy & Precautions, NPO status , Patient's Chart, lab work & pertinent test results  History of Anesthesia Complications Negative for: history of anesthetic complications  Airway Mallampati: II  TM Distance: >3 FB Neck ROM: Full   Comment: Previous grade I view with MAC 3, easy mask Dental  (+) Dental Advisory Given   Pulmonary neg shortness of breath, neg sleep apnea, neg COPD, neg recent URI, former smoker   Pulmonary exam normal breath sounds clear to auscultation       Cardiovascular (-) hypertension(-) angina (-) Past MI, (-) Cardiac Stents and (-) CABG + dysrhythmias (prolonged QT) + Valvular Problems/Murmurs MR  Rhythm:Regular Rate:Normal  Negative stress test 09/19/2023  TTE 09/01/2023: IMPRESSIONS    1. Left ventricular ejection fraction, by estimation, is 55 to 60%. The  left ventricle has normal function. The left ventricle has no regional  wall motion abnormalities. Left ventricular diastolic parameters were  normal. The average left ventricular  global longitudinal strain is -18.0 %. The global longitudinal strain is  normal.   2. Right ventricular systolic function is normal. The right ventricular  size is normal.   3. The mitral valve is abnormal. Mild mitral valve regurgitation. No  evidence of mitral stenosis.   4. The aortic valve is tricuspid. Aortic valve regurgitation is not  visualized. No aortic stenosis is present.   5. The inferior vena cava is normal in size with greater than 50%  respiratory variability, suggesting right atrial pressure of 3 mmHg.     Neuro/Psych negative neurological ROS     GI/Hepatic ,neg GERD  ,,(+)     substance abuse  marijuana usecholecystitis   Endo/Other  negative endocrine ROS    Renal/GU negative Renal ROS     Musculoskeletal   Abdominal  (+) + obese   Peds  Hematology negative hematology ROS (+) Lab Results      Component                Value               Date                      WBC                      6.5                 12/24/2023                HGB                      12.9                12/24/2023                HCT                      37.6                12/24/2023                MCV                      91.9                12/24/2023  PLT                      220                 12/24/2023              Anesthesia Other Findings   Reproductive/Obstetrics S/p hysterectomy                              Anesthesia Physical Anesthesia Plan  ASA: 2  Anesthesia Plan: General   Post-op Pain Management:    Induction: Intravenous and Rapid sequence  PONV Risk Score and Plan: 3 and Ondansetron , Dexamethasone , Midazolam  and Treatment may vary due to age or medical condition  Airway Management Planned: Oral ETT  Additional Equipment:   Intra-op Plan:   Post-operative Plan: Extubation in OR  Informed Consent: I have reviewed the patients History and Physical, chart, labs and discussed the procedure including the risks, benefits and alternatives for the proposed anesthesia with the patient or authorized representative who has indicated his/her understanding and acceptance.     Dental advisory given  Plan Discussed with: CRNA and Anesthesiologist  Anesthesia Plan Comments: (Risks of general anesthesia discussed including, but not limited to, sore throat, hoarse voice, chipped/damaged teeth, injury to vocal cords, nausea and vomiting, allergic reactions, lung infection, heart attack, stroke, and death. All questions answered. )         Anesthesia Quick Evaluation

## 2023-12-24 NOTE — Anesthesia Procedure Notes (Signed)
 Procedure Name: Intubation Date/Time: 12/24/2023 2:38 PM  Performed by: Virgil Ee, CRNAPre-anesthesia Checklist: Patient identified, Patient being monitored, Timeout performed, Emergency Drugs available and Suction available Patient Re-evaluated:Patient Re-evaluated prior to induction Oxygen Delivery Method: Circle system utilized Preoxygenation: Pre-oxygenation with 100% oxygen Induction Type: IV induction Ventilation: Mask ventilation without difficulty Laryngoscope Size: Mac and 4 Grade View: Grade I Tube type: Oral Tube size: 7.0 mm Number of attempts: 1 Airway Equipment and Method: Stylet Placement Confirmation: ETT inserted through vocal cords under direct vision, positive ETCO2 and breath sounds checked- equal and bilateral Secured at: 22 cm Tube secured with: Tape Dental Injury: Teeth and Oropharynx as per pre-operative assessment

## 2023-12-24 NOTE — Transfer of Care (Signed)
 Immediate Anesthesia Transfer of Care Note  Patient: Darlene Hayes  Procedure(s) Performed: LAPAROSCOPIC CHOLECYSTECTOMY (Abdomen)  Patient Location: PACU  Anesthesia Type:General  Level of Consciousness: drowsy and responds to stimulation  Airway & Oxygen Therapy: Patient Spontanous Breathing  Post-op Assessment: Report given to RN and Post -op Vital signs reviewed and stable  Post vital signs: Reviewed and stable  Last Vitals:  Vitals Value Taken Time  BP 145/69 12/24/23 16:05  Temp    Pulse 93 12/24/23 16:06  Resp 13 12/24/23 16:06  SpO2 94 % 12/24/23 16:06  Vitals shown include unfiled device data.  Last Pain:  Vitals:   12/24/23 1344  TempSrc: Oral  PainSc: 0-No pain         Complications: No notable events documented.

## 2023-12-24 NOTE — H&P (Signed)
 Darlene Hayes is an 38 y.o. female.   Chief Complaint: ab pain HPI: 60 yof who had epigastric pain 12/14 and was seen in er. This persisted and got worse prompting another visit.  It is worse with eating. She has some diarrhea.  She has nausea. No emesis. No fever.  She has never had this before.  LFTs, lipase and wbc are normal.  She has US  that shows cholelithiasis with mild gbw thickening measuring 3.5 mm, no fluid or murphys sign.  CBD is 6.7 mm.  We were asked to see her.   Past Medical History:  Diagnosis Date   Abnormal uterine bleeding (AUB)    History of abnormal cervical Pap smear    since 2000   History of chlamydia infection 07/2010   treated. hasn't been with that partner since.    History of gestational diabetes    History of gonorrhea    treated in July 2012. hasn't been with that partner since.    History of pregnancy induced hypertension    Iron deficiency anemia due to chronic blood loss    referred by hematology--- dr pauletta chihuahua---   IV iron infusion,  [id number];  12-14-2022;   01-21-2023;  01-28-2023   Mitral regurgitation    Symptomatic anemia    (01-24-2023  pt stated symptoms are chest pain, intermittant heart flutters, sob, weakness, fatigue)  admission in epic 11-06-2022  --- SOB / CP/  vaginal bleeding /  Hg2.6;   transfused 3units PRBC and 2 iron infusions ;  also referred to cardiology due to echo w/ moderate to severe MR unspecified pulm HTN   Uterine fibroid     Past Surgical History:  Procedure Laterality Date   CYSTOSCOPY N/A 04/01/2023   Procedure: CYSTOSCOPY;  Surgeon: Darlene Harari, MD;  Location: [facility] OR;  Service: Gynecology;  Laterality: N/A;   DILATION AND CURETTAGE OF UTERUS  12/2016   EAB done in [place]   TEE WITHOUT CARDIOVERSION  12/03/2022   [place] , [place] by dr r. blanco   TOTAL LAPAROSCOPIC HYSTERECTOMY WITH SALPINGECTOMY Bilateral 04/01/2023   Procedure: TOTAL LAPAROSCOPIC HYSTERECTOMY WITH BILATERAL SALPINGECTOMY;  Surgeon:  Darlene Harari, MD;  Location: [facility] OR;  Service: Gynecology;  Laterality: Bilateral;    Family History  Problem Relation Age of Onset   Hypertension Mother    Miscarriages / Stillbirths Sister    Hypertension Sister    Stroke Maternal Grandmother    Diabetes Maternal Grandmother    Social History:  reports that she has quit smoking. Her smoking use included cigarettes. She has never used smokeless tobacco. She reports that she does not currently use drugs after having used the following drugs: Marijuana. She reports that she does not drink alcohol.  Allergies: Allergies[1]  Medications Prior to Admission  Medication Sig Dispense Refill   furosemide  (LASIX ) 20 MG tablet Take 1 tablet (20 mg total) by mouth daily as needed. 25 tablet 3   ibuprofen  (ADVIL ) 600 MG tablet Take 1 tablet (600 mg total) by mouth every 6 (six) hours as needed. 30 tablet 0   Multiple Vitamins-Minerals (MULTIVITAMIN WOMEN PO) Take 1 capsule by mouth daily. (Patient not taking: Reported on 11/18/2023)     OVER THE COUNTER MEDICATION Take 2 tablets by mouth daily. [employer] Iron Blood Builder (Patient not taking: Reported on 11/18/2023)     potassium chloride  (KLOR-CON  M) 10 MEQ tablet Take 1 tablet (10 mEq total) by mouth daily as needed (lower extremity swelling). 25 tablet 3  simethicone  (MYLICON) 80 MG chewable tablet Chew 1 tablet (80 mg total) by mouth 3 (three) times daily for 3 days. (Patient not taking: Reported on 11/18/2023) 30 tablet 0    Results for orders placed or performed during the hospital encounter of 12/24/23 (from the past 48 hours)  Basic metabolic panel     Status: Abnormal   Collection Time: 12/24/23  3:01 AM  Result Value Ref Range   Sodium 138 135 - 145 mmol/L   Potassium 3.4 (L) 3.5 - 5.1 mmol/L   Chloride 101 98 - 111 mmol/L   CO2 23 22 - 32 mmol/L   Glucose, Bld 110 (H) 70 - 99 mg/dL    Comment: Glucose reference range applies only to samples taken after fasting for at least 8  hours.   BUN 10 6 - 20 mg/dL   Creatinine, Ser 9.21 0.44 - 1.00 mg/dL   Calcium 9.8 8.9 - 89.6 mg/dL   GFR, Estimated >39 >39 mL/min    Comment: (NOTE) Calculated using the CKD-EPI Creatinine Equation (2021)    Anion gap 14 5 - 15    Comment: Performed at [facility], [address] [place], [place] [address]  CBC     Status: None   Collection Time: 12/24/23  3:01 AM  Result Value Ref Range   WBC 6.5 4.0 - 10.5 K/uL   RBC 4.09 3.87 - 5.11 MIL/uL   Hemoglobin 12.9 12.0 - 15.0 g/dL   HCT 62.3 63.9 - 53.9 %   MCV 91.9 80.0 - 100.0 fL   MCH 31.5 26.0 - 34.0 pg   MCHC 34.3 30.0 - 36.0 g/dL   RDW 86.1 88.4 - 84.4 %   Platelets 220 150 - 400 K/uL   nRBC 0.0 0.0 - 0.2 %    Comment: Performed at [facility], [address] [place], [place] [address]  Troponin T, High Sensitivity     Status: None   Collection Time: 12/24/23  3:01 AM  Result Value Ref Range   Troponin T High Sensitivity <15 0 - 19 ng/L    Comment: (NOTE) Biotin concentrations > 1000 ng/mL falsely decrease TnT results.  Serial cardiac troponin measurements are suggested.  Refer to the Links section for chest pain algorithms and additional  guidance. Performed at [facility], [address] [place], [place] [address]   Hepatic function panel     Status: Abnormal   Collection Time: 12/24/23  4:17 AM  Result Value Ref Range   Total Protein 8.3 (H) 6.5 - 8.1 g/dL   Albumin 4.4 3.5 - 5.0 g/dL   AST 20 15 - 41 U/L   ALT 12 0 - 44 U/L   Alkaline Phosphatase 121 38 - 126 U/L   Total Bilirubin 0.5 0.0 - 1.2 mg/dL   Bilirubin, Direct 0.2 0.0 - 0.2 mg/dL   Indirect Bilirubin 0.3 0.3 - 0.9 mg/dL    Comment: Performed at [facility], [address] [place], [place] [address]  Lipase, blood     Status: None   Collection Time: 12/24/23  4:17 AM  Result Value Ref Range   Lipase 14 11 - 51 U/L    Comment: Performed at [facility], [address], [place], [place] [address]   US  Abdomen Limited RUQ (LIVER/GB) Result Date: 12/24/2023 EXAM: Right Upper Quadrant Abdominal Ultrasound 12/24/2023 05:50:51 AM TECHNIQUE: Real-time ultrasonography of the right upper quadrant of the abdomen was performed. COMPARISON: CT abdomen and pelvis 12/21/2023. CLINICAL  HISTORY: RUQ pain. FINDINGS: LIVER: The liver demonstrates normal echogenicity. No intrahepatic biliary ductal dilatation. No evidence of mass. The portal vein is patent with normal flow towards the liver. BILIARY SYSTEM: The gallbladder wall thickness measures 3.5 mm. Numerous gallstones identified within the gallbladder measures 1 cm. Negative sonographic Murphy sign. No pericholecystic fluid or gallbladder sludge. The common bile duct measures 6.7 mm. OTHER: No right upper quadrant ascites. IMPRESSION: 1. Cholelithiasis with mild gallbladder wall thickening measuring 3.5 mm in thickness. No pericholecystic fluid or sonographic murphy sign. Findings are equivocal for acute cholecystitis. 2. Upper limits of normal caliber of the common bile duct measure 6.7 mm. Electronically signed by: Waddell Calk MD 12/24/2023 06:05 AM EST RP Workstation: [id number]   DG Chest Portable 1 View Result Date: 12/24/2023 EXAM: 1 VIEW XRAY OF THE CHEST 12/24/2023 03:58:01 AM COMPARISON: 08/22/2023 CLINICAL HISTORY: Chest pain FINDINGS: LUNGS AND PLEURA: No focal pulmonary opacity. No pleural effusion. No pneumothorax. HEART AND MEDIASTINUM: No acute abnormality of the cardiac and mediastinal silhouettes. BONES AND SOFT TISSUES: No acute osseous abnormality. IMPRESSION: 1. No acute cardiopulmonary pathology. Electronically signed by: Oneil Devonshire MD 12/24/2023 03:59 AM EST RP Workstation: [id number]    Review of Systems  Gastrointestinal:  Positive for abdominal pain.  All other systems reviewed and are negative.   Blood pressure (P) 123/73, pulse 85, temperature 98.9 F (37.2 C),  temperature source Oral, resp. rate 20, height 5' 6 (1.676 m), weight 111.1 kg, last menstrual period 03/19/2023, SpO2 100%. Physical Exam Vitals reviewed.  Constitutional:      Appearance: She is well-developed.  Eyes:     General: No scleral icterus. Cardiovascular:     Rate and Rhythm: Normal rate.  Pulmonary:     Effort: Pulmonary effort is normal.  Abdominal:     Palpations: Abdomen is soft.     Tenderness: There is no abdominal tenderness.  Musculoskeletal:     Right lower leg: No edema.     Left lower leg: No edema.  Skin:    General: Skin is warm and dry.  Neurological:     General: No focal deficit present.     Mental Status: She is alert.  Psychiatric:        Mood and Affect: Mood normal.      Assessment/Plan Biliary colic -recurrent symptoms with two er visits. I think just needs lap chole today. -I discussed the procedure in detail.  We discussed the risks and benefits of a laparoscopic cholecystectomy and possible cholangiogram including, but not limited to bleeding, infection, injury to surrounding structures such as the intestine or liver, bile leak, retained gallstones, need to convert to an open procedure, prolonged diarrhea, blood clots such as  DVT, common bile duct injury, anesthesia risks, and possible need for additional procedures. We also discussed small risk of subtotal cholecystectomy.  The likelihood of improvement in symptoms and return to the patient's normal status is good. We discussed the typical post-operative recovery course.   Donnice Bury, MD 12/24/2023, 1:58 PM       [1] No Known Allergies

## 2023-12-24 NOTE — Op Note (Signed)
 Preoperative diagnosis: Acute cholecystitis Postoperative diagnosis: acute on chronic cholecystitis Procedure: Laparoscopic cholecystectomy Surgeon: Dr. Adina Bury Anesthesia: General Estimated blood loss: Less than 50 cc Specimens: Gallbladder and contents to pathology Complications: None Drains: None Sponge needle count was correct completion Disposition recovery stable addition   Indications: 2 yof with cholelithiasis and ruq pain c/w cholecystitis. We discussed lap cholecystectomy.    Procedure: After informed consent was obtained she was taken to the operating room.  She was given antibiotics.  She was also given indocyanine green  dye.  She had SCDs in place.  She was placed under general anesthesia without complication.  She was prepped and draped in a standard sterile surgical fashion.  Surgical timeout was then performed.   I infiltrated Marcaine  made an infraumbilical incision.  I grasped the fascia and incised it sharply. I then entered the peritoneum bluntly.  I placed a 0 vicryl pursestring suture through the fascia.  I thern inserted a hasson trocar.  I then insufflated the abdomen to 15 mmHg pressure.   I placed 3 additional trocars in the epigastrium and right side of the abdomen. The gallbladder was contracted and had significant chronic cholecystitis and acute disease as well. I had to take it off liver a little just to be able to grasp it and retract it. I then was able to retract the gallbladder cephalad. I had to release the very thick rind around the gallbladder to be able to dissect in the triangle.    I was able to obtain the critical view of safety completely.  I clipped the artery 3 times and divided leaving 2 clips in place. I identified the anatomy by obtaining the critical view as well as the ICG dye.  I then clipped the duct proximally and then made a ductotomy. There was stone in the distal cystic duct that I evacuated.  I then clipped it and divided it completely.   I removed the gallbladder from the liver bed.   I then placed the gallbladder in a retrieval bag.  This was later removed from the umbilical trocar.  I obtain hemostasis.  I evacuated all the fluid.  I did place some surgicel snow in the liver bed.  I removed all the trocars after desufflating the abdomen. I then closed the umbilical incision with a Vicryl suture using the suture passer device as well as tying the pursestring down.   This completely obliterated the defect. .  These were closed with 4-0 Monocryl and glue. She tolerated this well and was transferred to recovery stable.

## 2023-12-25 ENCOUNTER — Encounter (HOSPITAL_COMMUNITY): Payer: Self-pay | Admitting: General Surgery

## 2023-12-26 LAB — SURGICAL PATHOLOGY
